# Patient Record
Sex: Female | Born: 1955
Health system: Southern US, Community
[De-identification: ages and names within clinical notes are randomized; demographics above are authoritative.]

## PROBLEM LIST (undated history)

## (undated) DIAGNOSIS — M8580 Other specified disorders of bone density and structure, unspecified site: Secondary | ICD-10-CM

## (undated) DIAGNOSIS — D072 Carcinoma in situ of vagina: Secondary | ICD-10-CM

## (undated) DIAGNOSIS — B1920 Unspecified viral hepatitis C without hepatic coma: Secondary | ICD-10-CM

## (undated) DIAGNOSIS — N870 Mild cervical dysplasia: Secondary | ICD-10-CM

## (undated) DIAGNOSIS — J40 Bronchitis, not specified as acute or chronic: Secondary | ICD-10-CM

## (undated) DIAGNOSIS — D219 Benign neoplasm of connective and other soft tissue, unspecified: Secondary | ICD-10-CM

## (undated) DIAGNOSIS — I10 Essential (primary) hypertension: Secondary | ICD-10-CM

## (undated) DIAGNOSIS — I219 Acute myocardial infarction, unspecified: Secondary | ICD-10-CM

## (undated) HISTORY — DX: Other specified disorders of bone density and structure, unspecified site: M85.80

## (undated) HISTORY — DX: Acute myocardial infarction, unspecified: I21.9

## (undated) HISTORY — PX: OOPHORECTOMY: SHX86

## (undated) HISTORY — PX: OTHER SURGICAL HISTORY: SHX169

## (undated) HISTORY — DX: Mild cervical dysplasia: N87.0

## (undated) HISTORY — DX: Carcinoma in situ of vagina: D07.2

## (undated) HISTORY — PX: CARPAL TUNNEL RELEASE: SHX101

## (undated) HISTORY — DX: Benign neoplasm of connective and other soft tissue, unspecified: D21.9

---

## 2000-04-01 ENCOUNTER — Encounter: Payer: Self-pay | Admitting: Family Medicine

## 2000-04-01 ENCOUNTER — Encounter: Admission: RE | Admit: 2000-04-01 | Discharge: 2000-04-01 | Payer: Self-pay | Admitting: Family Medicine

## 2000-07-22 ENCOUNTER — Encounter: Admission: RE | Admit: 2000-07-22 | Discharge: 2000-07-22 | Payer: Self-pay | Admitting: Family Medicine

## 2000-07-22 ENCOUNTER — Encounter: Payer: Self-pay | Admitting: Family Medicine

## 2000-09-07 ENCOUNTER — Encounter: Payer: Self-pay | Admitting: Family Medicine

## 2000-09-07 ENCOUNTER — Encounter: Admission: RE | Admit: 2000-09-07 | Discharge: 2000-09-07 | Payer: Self-pay | Admitting: Family Medicine

## 2000-10-08 ENCOUNTER — Ambulatory Visit (HOSPITAL_COMMUNITY): Admission: RE | Admit: 2000-10-08 | Discharge: 2000-10-08 | Payer: Self-pay | Admitting: Cardiology

## 2000-10-08 ENCOUNTER — Encounter: Payer: Self-pay | Admitting: Cardiology

## 2001-07-29 ENCOUNTER — Encounter: Admission: RE | Admit: 2001-07-29 | Discharge: 2001-07-29 | Payer: Self-pay | Admitting: Family Medicine

## 2001-07-29 ENCOUNTER — Encounter: Payer: Self-pay | Admitting: Family Medicine

## 2002-02-17 ENCOUNTER — Other Ambulatory Visit: Admission: RE | Admit: 2002-02-17 | Discharge: 2002-02-17 | Payer: Self-pay | Admitting: Obstetrics and Gynecology

## 2002-11-09 HISTORY — PX: TOTAL ABDOMINAL HYSTERECTOMY: SHX209

## 2002-11-11 ENCOUNTER — Emergency Department (HOSPITAL_COMMUNITY): Admission: EM | Admit: 2002-11-11 | Discharge: 2002-11-11 | Payer: Self-pay | Admitting: Emergency Medicine

## 2002-11-16 ENCOUNTER — Encounter: Admission: RE | Admit: 2002-11-16 | Discharge: 2002-11-16 | Payer: Self-pay | Admitting: Family Medicine

## 2002-11-16 ENCOUNTER — Encounter: Payer: Self-pay | Admitting: Family Medicine

## 2002-11-24 ENCOUNTER — Encounter: Admission: RE | Admit: 2002-11-24 | Discharge: 2002-11-24 | Payer: Self-pay | Admitting: Family Medicine

## 2002-11-24 ENCOUNTER — Encounter: Payer: Self-pay | Admitting: Family Medicine

## 2002-11-30 ENCOUNTER — Inpatient Hospital Stay (HOSPITAL_COMMUNITY): Admission: RE | Admit: 2002-11-30 | Discharge: 2002-12-04 | Payer: Self-pay | Admitting: Obstetrics and Gynecology

## 2002-11-30 ENCOUNTER — Encounter (INDEPENDENT_AMBULATORY_CARE_PROVIDER_SITE_OTHER): Payer: Self-pay | Admitting: Specialist

## 2003-01-15 ENCOUNTER — Encounter: Payer: Self-pay | Admitting: Family Medicine

## 2003-01-15 ENCOUNTER — Encounter: Admission: RE | Admit: 2003-01-15 | Discharge: 2003-01-15 | Payer: Self-pay | Admitting: Family Medicine

## 2003-12-03 ENCOUNTER — Other Ambulatory Visit: Admission: RE | Admit: 2003-12-03 | Discharge: 2003-12-03 | Payer: Self-pay | Admitting: Obstetrics and Gynecology

## 2004-01-21 ENCOUNTER — Encounter: Admission: RE | Admit: 2004-01-21 | Discharge: 2004-01-21 | Payer: Self-pay | Admitting: Obstetrics and Gynecology

## 2004-12-03 ENCOUNTER — Other Ambulatory Visit: Admission: RE | Admit: 2004-12-03 | Discharge: 2004-12-03 | Payer: Self-pay | Admitting: Obstetrics and Gynecology

## 2005-02-02 ENCOUNTER — Encounter: Admission: RE | Admit: 2005-02-02 | Discharge: 2005-02-02 | Payer: Self-pay | Admitting: Family Medicine

## 2005-08-03 ENCOUNTER — Encounter: Admission: RE | Admit: 2005-08-03 | Discharge: 2005-08-03 | Payer: Self-pay | Admitting: Family Medicine

## 2005-12-08 ENCOUNTER — Other Ambulatory Visit: Admission: RE | Admit: 2005-12-08 | Discharge: 2005-12-08 | Payer: Self-pay | Admitting: Obstetrics and Gynecology

## 2006-01-11 ENCOUNTER — Encounter: Admission: RE | Admit: 2006-01-11 | Discharge: 2006-01-11 | Payer: Self-pay | Admitting: Gastroenterology

## 2006-01-15 ENCOUNTER — Ambulatory Visit (HOSPITAL_BASED_OUTPATIENT_CLINIC_OR_DEPARTMENT_OTHER): Admission: RE | Admit: 2006-01-15 | Discharge: 2006-01-15 | Payer: Self-pay | Admitting: Obstetrics and Gynecology

## 2006-02-05 ENCOUNTER — Encounter: Admission: RE | Admit: 2006-02-05 | Discharge: 2006-02-05 | Payer: Self-pay | Admitting: Family Medicine

## 2006-03-22 ENCOUNTER — Other Ambulatory Visit: Admission: RE | Admit: 2006-03-22 | Discharge: 2006-03-22 | Payer: Self-pay | Admitting: Obstetrics and Gynecology

## 2006-07-14 ENCOUNTER — Emergency Department (HOSPITAL_COMMUNITY): Admission: EM | Admit: 2006-07-14 | Discharge: 2006-07-14 | Payer: Self-pay | Admitting: Family Medicine

## 2006-09-24 ENCOUNTER — Other Ambulatory Visit: Admission: RE | Admit: 2006-09-24 | Discharge: 2006-09-24 | Payer: Self-pay | Admitting: Obstetrics and Gynecology

## 2006-10-05 ENCOUNTER — Encounter: Admission: RE | Admit: 2006-10-05 | Discharge: 2006-10-05 | Payer: Self-pay | Admitting: Family Medicine

## 2006-12-13 ENCOUNTER — Other Ambulatory Visit: Admission: RE | Admit: 2006-12-13 | Discharge: 2006-12-13 | Payer: Self-pay | Admitting: Obstetrics and Gynecology

## 2007-02-09 ENCOUNTER — Encounter: Admission: RE | Admit: 2007-02-09 | Discharge: 2007-02-09 | Payer: Self-pay | Admitting: Obstetrics and Gynecology

## 2007-05-26 ENCOUNTER — Other Ambulatory Visit: Admission: RE | Admit: 2007-05-26 | Discharge: 2007-05-26 | Payer: Self-pay | Admitting: Obstetrics and Gynecology

## 2007-09-30 ENCOUNTER — Encounter: Admission: RE | Admit: 2007-09-30 | Discharge: 2007-09-30 | Payer: Self-pay | Admitting: Family Medicine

## 2007-11-17 ENCOUNTER — Emergency Department (HOSPITAL_COMMUNITY): Admission: EM | Admit: 2007-11-17 | Discharge: 2007-11-17 | Payer: Self-pay | Admitting: Emergency Medicine

## 2007-12-15 ENCOUNTER — Other Ambulatory Visit: Admission: RE | Admit: 2007-12-15 | Discharge: 2007-12-15 | Payer: Self-pay | Admitting: Obstetrics and Gynecology

## 2008-03-07 ENCOUNTER — Encounter: Admission: RE | Admit: 2008-03-07 | Discharge: 2008-03-07 | Payer: Self-pay | Admitting: Family Medicine

## 2008-06-19 ENCOUNTER — Other Ambulatory Visit: Admission: RE | Admit: 2008-06-19 | Discharge: 2008-06-19 | Payer: Self-pay | Admitting: Obstetrics and Gynecology

## 2008-12-17 ENCOUNTER — Ambulatory Visit: Payer: Self-pay | Admitting: Obstetrics and Gynecology

## 2008-12-17 ENCOUNTER — Other Ambulatory Visit: Admission: RE | Admit: 2008-12-17 | Discharge: 2008-12-17 | Payer: Self-pay | Admitting: Obstetrics and Gynecology

## 2008-12-17 ENCOUNTER — Encounter: Payer: Self-pay | Admitting: Obstetrics and Gynecology

## 2009-03-08 ENCOUNTER — Encounter: Admission: RE | Admit: 2009-03-08 | Discharge: 2009-03-08 | Payer: Self-pay | Admitting: Obstetrics and Gynecology

## 2009-03-12 ENCOUNTER — Ambulatory Visit: Payer: Self-pay | Admitting: Obstetrics and Gynecology

## 2009-12-27 ENCOUNTER — Other Ambulatory Visit: Admission: RE | Admit: 2009-12-27 | Discharge: 2009-12-27 | Payer: Self-pay | Admitting: Gynecology

## 2009-12-27 ENCOUNTER — Ambulatory Visit: Payer: Self-pay | Admitting: Gynecology

## 2010-01-16 ENCOUNTER — Ambulatory Visit: Payer: Self-pay | Admitting: Obstetrics and Gynecology

## 2010-03-13 ENCOUNTER — Ambulatory Visit: Payer: Self-pay | Admitting: Obstetrics and Gynecology

## 2010-03-14 ENCOUNTER — Ambulatory Visit: Payer: Self-pay | Admitting: Obstetrics and Gynecology

## 2010-03-14 ENCOUNTER — Ambulatory Visit (HOSPITAL_BASED_OUTPATIENT_CLINIC_OR_DEPARTMENT_OTHER): Admission: RE | Admit: 2010-03-14 | Discharge: 2010-03-14 | Payer: Self-pay | Admitting: Obstetrics and Gynecology

## 2010-03-19 ENCOUNTER — Encounter: Admission: RE | Admit: 2010-03-19 | Discharge: 2010-03-19 | Payer: Self-pay | Admitting: Family Medicine

## 2010-04-25 ENCOUNTER — Ambulatory Visit: Payer: Self-pay | Admitting: Obstetrics and Gynecology

## 2010-05-09 HISTORY — PX: OTHER SURGICAL HISTORY: SHX169

## 2010-11-10 ENCOUNTER — Ambulatory Visit: Payer: Self-pay | Admitting: Obstetrics and Gynecology

## 2010-11-10 ENCOUNTER — Other Ambulatory Visit
Admission: RE | Admit: 2010-11-10 | Discharge: 2010-11-10 | Payer: Self-pay | Source: Home / Self Care | Admitting: Obstetrics and Gynecology

## 2010-11-13 ENCOUNTER — Ambulatory Visit: Payer: Self-pay | Admitting: Oncology

## 2010-12-08 LAB — CBC WITH DIFFERENTIAL/PLATELET
BASO%: 0.5 % (ref 0.0–2.0)
EOS%: 1.3 % (ref 0.0–7.0)
HCT: 43.2 % (ref 34.8–46.6)
MCHC: 34.1 g/dL (ref 31.5–36.0)
MCV: 95.5 fL (ref 79.5–101.0)
NEUT#: 1.8 10*3/uL (ref 1.5–6.5)
Platelets: 167 10*3/uL (ref 145–400)

## 2010-12-08 LAB — MORPHOLOGY

## 2010-12-08 LAB — CHCC SMEAR

## 2010-12-09 LAB — HEPATITIS B CORE ANTIBODY, TOTAL: Hep B Core Total Ab: NEGATIVE

## 2010-12-09 LAB — HEPATITIS B SURFACE ANTIGEN: Hepatitis B Surface Ag: NEGATIVE

## 2010-12-09 LAB — COMPREHENSIVE METABOLIC PANEL
ALT: 31 U/L (ref 0–35)
AST: 31 U/L (ref 0–37)
Albumin: 4.5 g/dL (ref 3.5–5.2)
Alkaline Phosphatase: 61 U/L (ref 39–117)
BUN: 7 mg/dL (ref 6–23)
CO2: 28 mEq/L (ref 19–32)
Sodium: 139 mEq/L (ref 135–145)
Total Protein: 7.2 g/dL (ref 6.0–8.3)

## 2010-12-09 LAB — HEPATITIS B SURFACE ANTIBODY,QUALITATIVE: Hep B S Ab: NEGATIVE

## 2010-12-09 LAB — HEPATITIS C ANTIBODY: HCV Ab: REACTIVE — AB

## 2010-12-09 LAB — HEPATITIS B CORE ANTIBODY, IGM: Hep B C IgM: NEGATIVE

## 2010-12-09 LAB — ANA: Anti Nuclear Antibody(ANA): NEGATIVE

## 2011-01-02 ENCOUNTER — Encounter: Payer: Self-pay | Admitting: Obstetrics and Gynecology

## 2011-01-06 ENCOUNTER — Other Ambulatory Visit: Payer: Self-pay | Admitting: Obstetrics and Gynecology

## 2011-01-06 ENCOUNTER — Other Ambulatory Visit (HOSPITAL_COMMUNITY)
Admission: RE | Admit: 2011-01-06 | Discharge: 2011-01-06 | Disposition: A | Discharge status: Home / Self Care | Payer: 59 | Source: Ambulatory Visit | Attending: Obstetrics and Gynecology | Admitting: Obstetrics and Gynecology

## 2011-01-06 ENCOUNTER — Encounter (INDEPENDENT_AMBULATORY_CARE_PROVIDER_SITE_OTHER): Payer: 59 | Admitting: Obstetrics and Gynecology

## 2011-01-06 DIAGNOSIS — Z01419 Encounter for gynecological examination (general) (routine) without abnormal findings: Secondary | ICD-10-CM

## 2011-01-06 DIAGNOSIS — R87619 Unspecified abnormal cytological findings in specimens from cervix uteri: Secondary | ICD-10-CM | POA: Insufficient documentation

## 2011-03-18 ENCOUNTER — Encounter (INDEPENDENT_AMBULATORY_CARE_PROVIDER_SITE_OTHER): Payer: 59

## 2011-03-18 DIAGNOSIS — M899 Disorder of bone, unspecified: Secondary | ICD-10-CM

## 2011-03-27 NOTE — Op Note (Signed)
NAME:  Brittany Duffy, AGENT NO.:  [id number]   MEDICAL RECORD NO.:  [id number]                   PATIENT TYPE:  INP   LOCATION:  0450                                 FACILITY:  [facility]   PHYSICIAN:  Rande Brunt. Eda Paschal, M.D.           DATE OF BIRTH:  Aug 15, 1956   DATE OF PROCEDURE:  DATE OF DISCHARGE:                                 OPERATIVE REPORT   PREOPERATIVE DIAGNOSES:  1. Pelvic pain.  2. Leiomyomata uteri.  3. Left adnexal mass suspicious for either endometrioma or possible tubo-     ovarian abscess.   POSTOPERATIVE DIAGNOSES:  1. Pelvic pain.  2. Leiomyomata uteri.  3. Acute pelvic inflammatory disease with tubo-ovarian abscess on the left     ovary.   PROCEDURE:  Exploratory laparotomy with total abdominal hysterectomy and  bilateral salpingo-oophorectomy.   SURGEON:  Daniel L. Eda Paschal, M.D.   FIRST ASSISTANT:  Ivor Costa. Farrel Gobble, M.D.   ANESTHESIA:  General endotracheal.   FINDINGS:  At the time of surgery, the patient had multiple myomas enlarging  her uterus to approximately 12-14 weeks' size.  The patient's left tube was  grossly dilated, and there was a complex mass involving the left tube and  ovary which drained purulent material.  In addition, when the peritoneal  cavity was first opened there was purulent material seen although it was  more seropurulent than it was frank pus.  It was not true frank pus.  The  right ovary and tube showed chronic inflammatory processes without any  obvious abscess.  Sigmoid colon was densely adherent to the uterus.  The  left adnexa was behind the uterus.  There was some induration on the serosa  of the sigmoid and at the surface of the cecum from this mass but it was  clearly extracolonic and not related to the colon.  Ileocecal junction could  be identified, and the appendix was normal.  The upper abdomen was not  examined because of concern of spreading purulent material.   DESCRIPTION OF  PROCEDURE:  After adequate general endotracheal anesthesia,  the patient was placed in the supine position and prepped and draped in the  usual sterile manner.  A Foley catheter was inserted in her bladder.  It  should be noted that the patient had been running a low grade fever at home  but when she came to the hospital today, her temperature was almost 102.  A  midline vertical incision was made.  The fascia and the peritoneum were  entered vertically without difficulty and when the peritoneal cavity was  opened, some serous fluid was obtained which clearly looked infected.  Some  of it was collected for cytology, and aerobic and anaerobic cultures were  also obtained.  It was carefully done in such a fashion so as to prevent  purulent material running up the gutters and under the diaphragms.  She was  never placed in Trendelenburg but was kept level the  entire procedure and  the upper abdomen was packed away as best as possible with sponges.  When  the procedure was started, the first anatomic structure that could be  identified with ease was the left round ligament.  It was sutured and cut.  The retroperitoneal area was opened.  Initially, a ureter could not be  identified so the surgeon put his hand behind the uterus to try to free up  the area and in freeing up the adnexa the tubo-ovarian abscess ruptured and  there was some purulent material in the lower pelvis which was immediately  copiously irrigated and removed.  At this point, after the cultures were  obtained, and the problem had been identified as acute pelvic inflammatory  disease, the patient was given a dose of Unasyn and Garamycin, 3 g of Unasyn  and 80 mg of Garamycin.  She had gotten 1 g of Mefoxin before the procedure.  The surgery then continued once the tubo-ovarian abscess had been evacuated.  It was much easier to do the surgery.  The right round ligament was clamped,  cut and suture ligated with #1 chromic catgut.   The vesicouterine fold in  the peritoneum was sharply incised.  The right adnexa was elevated.  Although there was no tubo-ovarian abscess it was chronically infected.  The  ureter was identified and the right infundibulopelvic ligament was clamped,  cut and doubly suture ligated with #1 chromic catgut.  The entire structures  could then be mobilized but you could not remove the tubo-ovarian abscess at  this point for fear of injuring the ureter, so the uterus was removed first  prior to going back to get the left adnexa out.  A bladder flap could easily  be dissected.  The uterine arteries were clamped, cut and doubly suture  ligated with #1 chromic catgut.  The parametrium was taken in successive  bites by clamping, cutting and suture ligating with #1 chromic catgut.  The  cervicovaginal junction was identified and with sharp dissection the uterus,  right ovary and tube were removed.  The vagina looked healthy although the  serosa of the bowel had some inflammatory changes.  They appeared to be  superficial.  The vaginal angle sutures were replaced with #1 chromic catgut  incorporating the parametrium for good vault support.  The cuff was whipped  stitched with a running locking 0 Vicryl.  We then went back and removed the  left adnexa.  It was elevated now that the uterus was gone.  The ureter was  identified.  The IP ligament was clamped, cut and doubly suture ligated with  #1 chromic catgut.  At this point, the pelvis was copiously irrigated with  antibiotics bug juice to remove all of the purulent material.  Once again we  were very careful not to put her in Trendelenburg so we would not grossly  infect the subdiaphragmatic areas.  Once this was done, she was then  copiously irrigated with Ringer's lactate.  Although everything looked dry,  Surgicel was left to prevent oozing.  A Blake drain was left in the peritoneal cavity for good drainage.  Two sponge, needle and instrument   counts were correct.  The peritoneum and fascia were closed in a single  layer with two running 0 PDS.  It was a double-armed suture.  The  suprafascial tissue was then copiously irrigated with Ringer's lactate.  The  skin was closed with staples.  Estimated blood loss at the time of  the  procedure was 300 mL with none replaced.  The patient tolerated the  procedure well and left the operating room in satisfactory condition  draining clear urine from her Foley catheter.                                               Daniel L. Eda Paschal, M.D.    [name]  D:  11/30/2002  T:  12/01/2002  Job:  [id number]

## 2011-03-27 NOTE — Op Note (Signed)
Brittany Duffy, Brittany Duffy NO.:  [id number]   MEDICAL RECORD NO.:  [id number]          PATIENT TYPE:  AMB   LOCATION:  [facility]                         FACILITY:  [facility]   PHYSICIAN:  Daniel L. Gottsegen, M.D.DATE OF BIRTH:  06-09-1956   DATE OF PROCEDURE:  01/15/2006  DATE OF DISCHARGE:                                 OPERATIVE REPORT   PREOPERATIVE DIAGNOSIS:  Vaginal intraepithelial neoplasia III.   POSTOPERATIVE DIAGNOSIS:  Vaginal intraepithelial neoplasia III.   OPERATION:  CO2 laser of the vagina.   SURGEON:  Dr. Eda Paschal.   ANESTHESIA:  General.   INDICATIONS:  The patient is a 55 year old female who had been followed in  the office and had a Pap smear showing VAIN. Colposcopy with biopsy was done  in the office revealing high-grade vaginal dysplasia and as a result of  that, she is now taken to the operating room for laser of the vagina. It  should be noted that the patient previously had cervical dysplasia and was  status post cryo and then hysterectomy done for other indications.   FINDINGS:  At the time of surgery, the top of the vagina showed obvious  white epithelium even without using the microscope. Acetic acid made it even  more noticeable. Total area involved was 2-3 cm.   PROCEDURE:  After adequate general anesthesia, the patient was placed in the  dorsal supine position, prepped and draped in a sterile manner with wet  towels because of the laser. The microscope was utilized, acetic acid was  placed over the area to outline the vaginal dysplasia easily. A  needle with  sterile saline in it was injected underneath the mucosa to give [place] a buffer  point to prevent injury to underlying tissue and then using a CO2 laser at 8  watts power the entire area to be lasered was outlined leaving a very wide  margin. The area was then removed with the CO2 laser going all the way  through the mucosa to a depth of probably 4-5 mm. At the termination of the  procedure, there was no bleeding noted. The patient tolerated the procedure  well and left the operating room in satisfactory condition.      Daniel L. Eda Paschal, M.D.  Electronically Signed     DLG/MEDQ  D:  01/15/2006  T:  01/15/2006  Job:  [id number]

## 2011-03-27 NOTE — H&P (Signed)
NAME:  Brittany Duffy, Brittany Duffy                  ACCOUNT NO.:  [id number]   MEDICAL RECORD NO.:  [id number]                   PATIENT TYPE:  INP   LOCATION:  NA                                   FACILITY[facility]  [facility]   PHYSICIAN:  Daniel L. Eda Paschal, M.D.           DATE OF BIRTH:  Aug 23, 1956   DATE OF ADMISSION:  DATE OF DISCHARGE:                                HISTORY & PHYSICAL   CHIEF COMPLAINT:  Pelvic pain and fever.   HISTORY OF PRESENT ILLNESS:  The patient is a 55 year old, gravida 2, para  1, abortus 1, who presented to my office in early January with left lower  quadrant pain and fevers.  On examination she had an enlarged uterus due to  fibroids and a painful left adnexa.  She underwent ultrasound which revealed  multiple fibroids and a large mass of her left ovary of 6.7 x 7.2 x 4.9 cm.  It had an echodense ground-glass appearance with free fluid surrounding it.  It seemed to be most consistent with an endometrioma.  She was having some  upper respiratory symptoms so she was referred to her internist with the  thought that maybe the fever was due to that, and then we were going to  observe the mass.  The patient has now been treated with Zithromax, Biaxin,  and Augmentin.  Her fevers have persisted.  She continues to have a  leukocytosis.  She underwent a CT of her abdomen which was normal.  CT of  the pelvis showed that the mass has now increased in size.  As a result of  all of the above, she now enters the hospital for definitive surgery because  of her worsening condition.  She will undergo a total abdominal  hysterectomy, left salpingo-oophorectomy, and possibly a right salpingo-  oophorectomy if she either has a widespread inflammatory process or has a  malignancy.  Assuming she has disease confined to the uterus and the ovary,  we will leave her right ovary in as per her desire.  She has undergone a  bowel prep.   PAST MEDICAL HISTORY:  1. History of hypertension.  2. Diverticulum of the bladder.   PRESENT MEDICATIONS:  Lotensin for her blood pressure.  She also takes  Xanax.   ALLERGIES:  None.   FAMILY HISTORY:  Completely noncontributory except her mother with diabetes  and hypertension.   SOCIAL HISTORY:  She occasionally uses alcohol, and she smokes one pack of  cigarettes per day.   REVIEW OF SYSTEMS:  HEENT:  Negative.  CARDIAC:  Hypertension.  GASTROINTESTINAL:  Negative.  GENITOURINARY:  Negative.  NEUROMUSCULAR:  Negative.  ENDOCRINE:  Negative.  PSYCHIATRIC:  Negative.  IMMUNOLOGICAL:  Negative.   PHYSICAL EXAMINATION:  GENERAL:  Well-developed, well-nourished female in  distress from lower abdominal discomfort.  VITAL SIGNS:  Blood pressure 138/86, pulse 80 and regular, respirations 16  and unlabored.  She is afebrile.  HEENT:  All within normal limits.  NECK:  Supple.  Trachea is midline.  Thyroid is not enlarged.  LUNGS:  Clear to P&A.  HEART:  No thrills, heaves, or murmurs.  BREASTS:  No masses.  ABDOMEN:  Soft, without rebound or masses.  She does have left lower  quadrant guarding.  PELVIC:  External is within normal limits.  BUS is within normal limits.  Vaginal is within normal limits.  Cervix is clean.  There is no purulent  discharge.  Uterus is tender and is enlarged by fibroids to what is felt to  be about 12 weeks' size, although because of the left adnexal mass this  could be an overestimate.  Right adnexa is normal.  Left adnexa is tender,  and it is hard to differentiate a mass in the left side from her uterus.  RECTOVAGINAL:  Confirmatory.  EXTREMITIES:  Within normal limits.   ADMISSION IMPRESSION:  1. Left ovarian mass.  2. Leiomyomata uteri.  3. Pain.  4. Fever.   PLAN:  Exploratory laparotomy with appropriate surgery as outlined above.                                               Daniel L. Eda Paschal, M.D.    [name]  D:  11/30/2002  T:  11/30/2002  Job:  [id number]

## 2011-03-27 NOTE — Discharge Summary (Signed)
   NAME:  Brittany Duffy, NASS NO.:  [id number]   MEDICAL RECORD NO.:  [id number]                   PATIENT TYPE:  INP   LOCATION:  0450                                 FACILITY:  [facility]   PHYSICIAN:  Rande Brunt. Eda Paschal, M.D.           DATE OF BIRTH:  [birth year]   DATE OF ADMISSION:  11/30/2002  DATE OF DISCHARGE:  12/04/2002                                 DISCHARGE SUMMARY   HISTORY OF PRESENT ILLNESS:  The patient is a 55 year old female who was  admitted to the hospital with chronic pelvic pain, persistent fever,  leukocytosis, and a left adnexal mass.   HOSPITAL COURSE:  On the day of admission she was taken to the operating  room.  A total abdominal hysterectomy, bilateral salpingo-oophorectomy was  performed for chronic pelvic inflammatory disease as well as an acute tubo-  ovarian abscess of the left ovary.  The patient also had multiple fibroids.  Postoperatively she was continued on Unasyn and gentamicin because of the  acute PID.  She continued to spike a slight temperature every night, between  100 and 101.4, but by the third postoperative day she started to become  afebrile and remained afebrile for 24 hours.  She also had a slight ileus  that responded to IVs, and by the fourth postoperative day she was passing  gas and was afebrile and was ready for discharge.   DISCHARGE MEDICATIONS:  1. Augmentin 875 mg 1 b.i.d. for four more days.  2. Tylox for pain relief.   DIET:  Regular.   ACTIVITY:  Ambulatory.   WOUND CARE:  Routine.   FOLLOW-UP:  She will return to the office on Friday for staple removal.   CONDITION ON DISCHARGE:  Improved.   PATHOLOGY:  Final pathology report not available at the time of dictation.   DISCHARGE DIAGNOSES:  1. Acute and chronic pelvic inflammatory disease with left tubo-ovarian     abscess.  2. Leiomyomata uteri.                                               Daniel L. Eda Paschal, M.D.    [name]   D:  12/04/2002  T:  12/04/2002  Job:  [id number]

## 2011-04-02 ENCOUNTER — Other Ambulatory Visit: Payer: Self-pay | Admitting: Oncology

## 2011-04-02 ENCOUNTER — Encounter (HOSPITAL_BASED_OUTPATIENT_CLINIC_OR_DEPARTMENT_OTHER): Payer: 59 | Admitting: Oncology

## 2011-04-02 DIAGNOSIS — D72819 Decreased white blood cell count, unspecified: Secondary | ICD-10-CM

## 2011-04-02 DIAGNOSIS — B1710 Acute hepatitis C without hepatic coma: Secondary | ICD-10-CM

## 2011-04-02 LAB — CBC WITH DIFFERENTIAL/PLATELET
BASO%: 0.7 % (ref 0.0–2.0)
EOS%: 4.1 % (ref 0.0–7.0)
Eosinophils Absolute: 0.2 10*3/uL (ref 0.0–0.5)
MCH: 33.2 pg (ref 25.1–34.0)
MONO#: 0.3 10*3/uL (ref 0.1–0.9)
MONO%: 7.4 % (ref 0.0–14.0)
NEUT%: 37.7 % — ABNORMAL LOW (ref 38.4–76.8)

## 2011-04-03 LAB — HEPATITIS C RNA QUANTITATIVE
HCV Quantitative Log: 5.18 {Log} — ABNORMAL HIGH (ref <1.63)
HCV Quantitative: 151000 IU/mL — ABNORMAL HIGH (ref <43)

## 2011-07-31 ENCOUNTER — Encounter (HOSPITAL_BASED_OUTPATIENT_CLINIC_OR_DEPARTMENT_OTHER): Payer: BC Managed Care – PPO | Admitting: Oncology

## 2011-07-31 ENCOUNTER — Other Ambulatory Visit: Payer: Self-pay | Admitting: Oncology

## 2011-07-31 DIAGNOSIS — D72819 Decreased white blood cell count, unspecified: Secondary | ICD-10-CM

## 2011-07-31 DIAGNOSIS — B1710 Acute hepatitis C without hepatic coma: Secondary | ICD-10-CM

## 2011-07-31 LAB — CBC WITH DIFFERENTIAL/PLATELET
BASO%: 0.5 % (ref 0.0–2.0)
Basophils Absolute: 0 10*3/uL (ref 0.0–0.1)
EOS%: 2.3 % (ref 0.0–7.0)
HCT: 39.7 % (ref 34.8–46.6)
MCHC: 34.9 g/dL (ref 31.5–36.0)
MCV: 95.7 fL (ref 79.5–101.0)
MONO#: 0.4 10*3/uL (ref 0.1–0.9)
Platelets: 148 10*3/uL (ref 145–400)

## 2011-07-31 LAB — COMPREHENSIVE METABOLIC PANEL
AST: 34 U/L (ref 0–37)
Alkaline Phosphatase: 66 U/L (ref 39–117)
BUN: 11 mg/dL (ref 6–23)
Calcium: 10.5 mg/dL (ref 8.4–10.5)
Chloride: 102 mEq/L (ref 96–112)
Creatinine, Ser: 0.61 mg/dL (ref 0.50–1.10)
Potassium: 3.8 mEq/L (ref 3.5–5.3)
Sodium: 140 mEq/L (ref 135–145)
Total Bilirubin: 0.3 mg/dL (ref 0.3–1.2)
Total Protein: 7.4 g/dL (ref 6.0–8.3)

## 2011-07-31 LAB — MORPHOLOGY
PLT EST: ADEQUATE
RBC Comments: NORMAL

## 2011-08-06 ENCOUNTER — Encounter: Payer: Self-pay | Admitting: *Deleted

## 2011-08-06 DIAGNOSIS — M8580 Other specified disorders of bone density and structure, unspecified site: Secondary | ICD-10-CM | POA: Insufficient documentation

## 2011-08-06 DIAGNOSIS — D072 Carcinoma in situ of vagina: Secondary | ICD-10-CM | POA: Insufficient documentation

## 2011-08-06 DIAGNOSIS — N7092 Oophoritis, unspecified: Secondary | ICD-10-CM | POA: Insufficient documentation

## 2011-08-07 ENCOUNTER — Ambulatory Visit: Payer: 59 | Admitting: Women's Health

## 2011-08-07 ENCOUNTER — Encounter: Payer: Self-pay | Admitting: Women's Health

## 2011-08-07 ENCOUNTER — Other Ambulatory Visit (HOSPITAL_COMMUNITY)
Admission: RE | Admit: 2011-08-07 | Discharge: 2011-08-07 | Disposition: A | Discharge status: Home / Self Care | Payer: BC Managed Care – PPO | Source: Ambulatory Visit | Attending: Women's Health | Admitting: Women's Health

## 2011-08-07 ENCOUNTER — Ambulatory Visit (INDEPENDENT_AMBULATORY_CARE_PROVIDER_SITE_OTHER): Payer: BC Managed Care – PPO | Admitting: Women's Health

## 2011-08-07 VITALS — BP 130/80

## 2011-08-07 DIAGNOSIS — N893 Dysplasia of vagina, unspecified: Secondary | ICD-10-CM

## 2011-08-07 DIAGNOSIS — Z889 Allergy status to unspecified drugs, medicaments and biological substances status: Secondary | ICD-10-CM

## 2011-08-07 DIAGNOSIS — Z9109 Other allergy status, other than to drugs and biological substances: Secondary | ICD-10-CM

## 2011-08-07 DIAGNOSIS — Z01419 Encounter for gynecological examination (general) (routine) without abnormal findings: Secondary | ICD-10-CM | POA: Insufficient documentation

## 2011-08-07 MED ORDER — FLUTICASONE PROPIONATE 50 MCG/ACT NA SUSP: 2.0000 | Freq: Every day | NASAL | Status: DC

## 2011-08-07 NOTE — Progress Notes (Signed)
  Presents for repeat Pap without complaint. History of VAIN 1 8/ 09, history of VAIN III 3/11, laser of vagina 3/11,normal pap 2/12.  External genitalia is within normal limits, speculum exam there discharge or erythema was noted, repeat Pap was taken and will triage based on results. Reviewed if normal, repeat Pap in February at annual exam.  Requested refill of Flonase prescription was given.

## 2011-08-12 ENCOUNTER — Other Ambulatory Visit: Payer: Self-pay | Admitting: Women's Health

## 2011-08-12 DIAGNOSIS — Z889 Allergy status to unspecified drugs, medicaments and biological substances status: Secondary | ICD-10-CM

## 2011-08-12 MED ORDER — FLUTICASONE PROPIONATE 50 MCG/ACT NA SUSP: 2.0000 | Freq: Every day | NASAL | Status: DC

## 2011-10-12 ENCOUNTER — Other Ambulatory Visit: Payer: Self-pay | Admitting: Obstetrics and Gynecology

## 2011-10-12 DIAGNOSIS — Z1231 Encounter for screening mammogram for malignant neoplasm of breast: Secondary | ICD-10-CM

## 2011-10-14 ENCOUNTER — Ambulatory Visit
Admission: RE | Admit: 2011-10-14 | Discharge: 2011-10-14 | Disposition: A | Discharge status: Home / Self Care | Payer: BC Managed Care – PPO | Source: Ambulatory Visit | Attending: Obstetrics and Gynecology | Admitting: Obstetrics and Gynecology

## 2011-10-14 DIAGNOSIS — Z1231 Encounter for screening mammogram for malignant neoplasm of breast: Secondary | ICD-10-CM

## 2011-11-13 ENCOUNTER — Other Ambulatory Visit: Payer: Self-pay | Admitting: Obstetrics and Gynecology

## 2011-12-17 ENCOUNTER — Other Ambulatory Visit: Payer: Self-pay | Admitting: Obstetrics and Gynecology

## 2012-01-23 ENCOUNTER — Encounter (HOSPITAL_COMMUNITY): Payer: Self-pay | Admitting: Emergency Medicine

## 2012-01-23 ENCOUNTER — Emergency Department (HOSPITAL_COMMUNITY)
Admission: EM | Admit: 2012-01-23 | Discharge: 2012-01-23 | Disposition: A | Discharge status: Home / Self Care | Payer: BC Managed Care – PPO | Source: Home / Self Care | Attending: Family Medicine | Admitting: Family Medicine

## 2012-01-23 DIAGNOSIS — J302 Other seasonal allergic rhinitis: Secondary | ICD-10-CM

## 2012-01-23 DIAGNOSIS — J309 Allergic rhinitis, unspecified: Secondary | ICD-10-CM

## 2012-01-23 HISTORY — DX: Essential (primary) hypertension: I10

## 2012-01-23 HISTORY — DX: Bronchitis, not specified as acute or chronic: J40

## 2012-01-23 MED ORDER — AZELASTINE HCL 0.1 % NA SOLN: 1.0000 | Freq: Two times a day (BID) | NASAL | Status: AC

## 2012-01-23 NOTE — Discharge Instructions (Signed)
You must stop smoking, use saline nose spray or Netti pot as discussed, drink lots of water, see your doctor as needed.

## 2012-01-23 NOTE — ED Notes (Signed)
Patient reports sinus complaints for more than a year.  , reports drainage down throat, stuffy nose intermittently, denies cough, denies fever.

## 2012-01-23 NOTE — ED Notes (Signed)
Patient not in treatment room 

## 2012-01-23 NOTE — ED Provider Notes (Signed)
History     CSN: [id number]  Arrival date & time 01/23/12  1443   First MD Initiated Contact with Patient 01/23/12 1505      Chief Complaint  Patient presents with  . URI    (Consider location/radiation/quality/duration/timing/severity/associated sxs/prior treatment) Patient is a 56 y.o. female presenting with URI. The history is provided by the patient.  URI Primary symptoms do not include fever or sore throat. The current episode started more than 1 week ago (sx for 1.5 yrs). This is a new problem. The problem has not changed since onset. Symptoms associated with the illness include congestion and rhinorrhea.    Past Medical History  Diagnosis Date  . Ovarian abscess     TUBAL OVARIAN ABSCESS  . Fibroid   . Dysplasia of cervix, low grade (CIN 1)     S/P CRYO  . Vaginal intraepithelial neoplasia III (VAIN III)   . Osteopenia   . Hypertension   . Bronchitis     Past Surgical History  Procedure Date  . Diverticulum of the bladder   . Total abdominal hysterectomy 2004    BSO  . Carpal tunnel release   . Co2 lsaer of vagina 07, 11    Family History  Problem Relation Age of Onset  . Diabetes Mother   . Hypertension Mother   . Breast cancer Mother     History  Substance Use Topics  . Smoking status: Current Everyday Smoker  . Smokeless tobacco: Never Used  . Alcohol Use: Yes     occ.    OB History    Grav Para Term Preterm Abortions TAB SAB Ect Mult Living   2 1   1 1           Review of Systems  Constitutional: Negative for fever.  HENT: Positive for congestion, rhinorrhea and postnasal drip. Negative for sore throat.   Eyes: Negative.   Respiratory: Negative.   Cardiovascular: Negative.     Allergies  Review of patient's allergies indicates no known allergies.  Home Medications   Current Outpatient Rx  Name Route Sig Dispense Refill  . XANAX PO Oral Take by mouth.      . NORVASC PO Oral Take by mouth.      Marland Kitchen VITAMIN C PO Oral Take by mouth.       . AZELASTINE HCL 137 MCG/SPRAY NA SOLN Nasal Place 1 spray into the nose 2 (two) times daily. Use in each nostril as directed 30 mL 12  . CALCIUM PO Oral Take by mouth.      . DOXYCYCLINE HYCLATE 100 MG PO CAPS Oral Take 100 mg by mouth 2 (two) times daily.      Marland Kitchen ESTRADIOL 0.0375 MG/24HR TD PTWK Transdermal Place 1 patch onto the skin once a week.      . OMEGA-3 FATTY ACIDS 1000 MG PO CAPS Oral Take 2 g by mouth daily.      Marland Kitchen FLUTICASONE PROPIONATE 50 MCG/ACT NA SUSP Nasal Place 2 sprays into the nose daily. 16 g 12  . MULTIVITAMIN PO Oral Take by mouth.      Marland Kitchen VITAMIN E PO Oral Take by mouth.        BP 164/94  Pulse 79  Temp(Src) 98.8 F (37.1 C) (Oral)  Resp 18  SpO2 99%  LMP 01/23/2012  Physical Exam  Nursing note and vitals reviewed. Constitutional: She is oriented to person, place, and time. She appears well-developed and well-nourished.  HENT:  Head: Normocephalic.  Right  Ear: External ear normal.  Left Ear: External ear normal.  Nose: Nose normal.  Mouth/Throat: Oropharynx is clear and moist.  Eyes: Conjunctivae are normal. Pupils are equal, round, and reactive to light.  Neck: Normal range of motion. Neck supple.  Pulmonary/Chest: Breath sounds normal.  Lymphadenopathy:    She has no cervical adenopathy.  Neurological: She is alert and oriented to person, place, and time.  Skin: Skin is warm and dry.    ED Course  Procedures (including critical care time)  Labs Reviewed - No data to display No results found.   1. Allergic rhinitis, seasonal       MDM          Linna Hoff, MD 01/23/12 [phone]

## 2012-02-12 ENCOUNTER — Ambulatory Visit (INDEPENDENT_AMBULATORY_CARE_PROVIDER_SITE_OTHER): Payer: BC Managed Care – PPO | Admitting: Obstetrics and Gynecology

## 2012-02-12 ENCOUNTER — Other Ambulatory Visit (HOSPITAL_COMMUNITY)
Admission: RE | Admit: 2012-02-12 | Discharge: 2012-02-12 | Disposition: A | Discharge status: Home / Self Care | Payer: BC Managed Care – PPO | Source: Ambulatory Visit | Attending: Obstetrics and Gynecology | Admitting: Obstetrics and Gynecology

## 2012-02-12 ENCOUNTER — Encounter: Payer: BC Managed Care – PPO | Admitting: Obstetrics and Gynecology

## 2012-02-12 ENCOUNTER — Encounter: Payer: Self-pay | Admitting: Obstetrics and Gynecology

## 2012-02-12 VITALS — BP 118/74 | Ht 61.0 in | Wt 136.0 lb

## 2012-02-12 DIAGNOSIS — Z01419 Encounter for gynecological examination (general) (routine) without abnormal findings: Secondary | ICD-10-CM

## 2012-02-12 DIAGNOSIS — N890 Mild vaginal dysplasia: Secondary | ICD-10-CM

## 2012-02-12 DIAGNOSIS — N893 Dysplasia of vagina, unspecified: Secondary | ICD-10-CM

## 2012-02-12 MED ORDER — ESTRADIOL 0.0375 MG/24HR TD PTWK: 1.0000 | MEDICATED_PATCH | TRANSDERMAL | Status: DC

## 2012-02-12 NOTE — Progress Notes (Signed)
Patient came to see me today for her annual GYN exam. She is now had 3 normal Pap smears since we treated her for high-grade vaginal dysplasia. She is up-to-date on mammograms. She does have osteopenia on bone density without an elevated fracture risk. She is due for followup bone density in May of 2014. She thinks she's had her lab work done elsewhere. She will check and let me know if she has not. She remains on the Climara patch with excellent results. She is having no vaginal bleeding. She is having no pelvic pain.  HEENT: Within normal limits. Kennon Portela present. Neck: No masses. Supraclavicular lymph nodes: Not enlarged. Breasts: Examined in both sitting and lying position. Symmetrical without skin changes or masses. Abdomen: Soft no masses guarding or rebound. No hernias. Pelvic: External within normal limits. BUS within normal limits. Vaginal examination shows good estrogen effect, no cystocele enterocele or rectocele. Cervix and uterus absent. Adnexa within normal limits. Rectovaginal confirmatory. Extremities within normal limits.  Assessment:VAIN 3 with reoccurrence status post 2 CO2 lasers of the vagina. 3 normal Pap smears now. Vasomotor symptoms. Osteopenia.  Plan: Continue yearly mammograms. Continue Climara patch. Followup bone density 2 years after her last one.

## 2012-02-13 LAB — URINALYSIS W MICROSCOPIC + REFLEX CULTURE
Bacteria, UA: NONE SEEN
Casts: NONE SEEN
Crystals: NONE SEEN
Ketones, ur: NEGATIVE mg/dL
Leukocytes, UA: NEGATIVE
Nitrite: NEGATIVE
Specific Gravity, Urine: 1.007 (ref 1.005–1.030)
pH: 7 (ref 5.0–8.0)

## 2012-11-04 ENCOUNTER — Other Ambulatory Visit: Payer: Self-pay | Admitting: Obstetrics and Gynecology

## 2012-11-04 DIAGNOSIS — Z1231 Encounter for screening mammogram for malignant neoplasm of breast: Secondary | ICD-10-CM

## 2012-12-08 ENCOUNTER — Ambulatory Visit
Admission: RE | Admit: 2012-12-08 | Discharge: 2012-12-08 | Disposition: A | Discharge status: Home / Self Care | Payer: BC Managed Care – PPO | Source: Ambulatory Visit | Attending: Obstetrics and Gynecology | Admitting: Obstetrics and Gynecology

## 2012-12-08 DIAGNOSIS — Z1231 Encounter for screening mammogram for malignant neoplasm of breast: Secondary | ICD-10-CM

## 2013-02-13 ENCOUNTER — Encounter: Payer: BC Managed Care – PPO | Admitting: Women's Health

## 2013-02-17 ENCOUNTER — Other Ambulatory Visit (HOSPITAL_COMMUNITY)
Admission: RE | Admit: 2013-02-17 | Discharge: 2013-02-17 | Disposition: A | Discharge status: Home / Self Care | Payer: BC Managed Care – PPO | Source: Ambulatory Visit | Attending: Obstetrics and Gynecology | Admitting: Obstetrics and Gynecology

## 2013-02-17 ENCOUNTER — Encounter: Payer: Self-pay | Admitting: Women's Health

## 2013-02-17 ENCOUNTER — Ambulatory Visit (INDEPENDENT_AMBULATORY_CARE_PROVIDER_SITE_OTHER): Payer: BC Managed Care – PPO | Admitting: Women's Health

## 2013-02-17 VITALS — BP 120/70 | Ht 61.0 in | Wt 137.0 lb

## 2013-02-17 DIAGNOSIS — M899 Disorder of bone, unspecified: Secondary | ICD-10-CM

## 2013-02-17 DIAGNOSIS — Z01419 Encounter for gynecological examination (general) (routine) without abnormal findings: Secondary | ICD-10-CM

## 2013-02-17 DIAGNOSIS — F17210 Nicotine dependence, cigarettes, uncomplicated: Secondary | ICD-10-CM | POA: Insufficient documentation

## 2013-02-17 DIAGNOSIS — M8580 Other specified disorders of bone density and structure, unspecified site: Secondary | ICD-10-CM

## 2013-02-17 DIAGNOSIS — Z79890 Hormone replacement therapy (postmenopausal): Secondary | ICD-10-CM

## 2013-02-17 DIAGNOSIS — F17200 Nicotine dependence, unspecified, uncomplicated: Secondary | ICD-10-CM

## 2013-02-17 MED ORDER — ESTRADIOL 0.0375 MG/24HR TD PTWK: 1.0000 | MEDICATED_PATCH | TRANSDERMAL | Status: DC

## 2013-02-17 NOTE — Addendum Note (Signed)
Addended by: Aura Camps on: 02/17/2013 02:46 PM   Modules accepted: Orders

## 2013-02-17 NOTE — Patient Instructions (Addendum)

## 2013-02-17 NOTE — Progress Notes (Signed)
Brittany Duffy 02/11/1956 [id number]    History:    The patient presents for annual exam.  TAH with BSO 2004 on Climara 0.0375 patch weekly. History of CO2 laser the vagina for VAIN 111, normal Paps 2012,2013. Smokes half pack cigarettes per day. Negative colonoscopy 2007. Osteopenia, DEXA 03/2011, T score AP spine -1.2, bilateral hip average -1, FRAX 3.1%/0.3%. Same partner. Hypertension-primary care labs and meds. Normal mammogram history.   Past medical history, past surgical history, family history and social history were all reviewed and documented in the EPIC chart. 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 Mother with diabetes/hypertension/breast cancer in her 49s, died from diabetes complications.   ROS:  A  ROS was performed and pertinent positives and negatives are included in the history.  Exam:  Filed Vitals:   02/17/13 1207  BP: 120/70    General appearance:  Normal Head/Neck:  Normal, without cervical or supraclavicular adenopathy. Thyroid:  Symmetrical, normal in size, without palpable masses or nodularity. Respiratory  Effort:  Normal  Auscultation:  Clear without wheezing or rhonchi Cardiovascular  Auscultation:  Regular rate, without rubs, murmurs or gallops  Edema/varicosities:  Not grossly evident Abdominal  Soft,nontender, without masses, guarding or rebound.  Liver/spleen:  No organomegaly noted  Hernia:  None appreciated  Skin  Inspection:  Grossly normal  Palpation:  Grossly normal Neurologic/psychiatric  Orientation:  Normal with appropriate conversation.  Mood/affect:  Normal  Genitourinary    Breasts: Examined lying and sitting.     Right: Without masses, retractions, discharge or axillary adenopathy.     Left: Without masses, retractions, discharge or axillary adenopathy.   Inguinal/mons:  Normal without inguinal adenopathy  External genitalia:  Normal  BUS/Urethra/Skene's glands:  Normal  Bladder:  Normal  Vagina:  Normal  Cervix:   absent  Uterus:  absent  Adnexa/parametria:     Rt: Without masses or tenderness.   Lt: Without masses or tenderness.  Anus and perineum: Normal  Digital rectal exam: Normal sphincter tone without palpated masses or tenderness  Assessment/Plan:  57 y.o.SBF G2 P1  for annual exam with no complaints.  TAH/BSO on Climara 0.0375 patch weekly CO2 laser 2007 VAIN 111 Hypertension-primary care labs and meds Osteopenia Smoker half pack per day  Plan: Aware of hazards of smoking is trying to decrease and quit. Repeat DEXA, will schedule, SBE's, continue annual mammogram, calcium rich diet, vitamin D 2000 daily encouraged. Home safety, fall prevention and importance of regular exercise reviewed. Climara 0.0375 patch weekly, reviewed risk for blood clots, strokes, breast cancer. Home Hemoccult card given with instructions. Repeat colonoscopy 2017.    Harrington Challenger WHNP, 1:12 PM 02/17/2013

## 2013-02-23 ENCOUNTER — Encounter: Payer: Self-pay | Admitting: Obstetrics and Gynecology

## 2013-03-10 ENCOUNTER — Other Ambulatory Visit: Payer: Self-pay | Admitting: Anesthesiology

## 2013-03-10 DIAGNOSIS — Z1211 Encounter for screening for malignant neoplasm of colon: Secondary | ICD-10-CM

## 2013-06-21 ENCOUNTER — Other Ambulatory Visit: Payer: Self-pay | Admitting: Gynecology

## 2013-06-21 DIAGNOSIS — M8580 Other specified disorders of bone density and structure, unspecified site: Secondary | ICD-10-CM

## 2013-06-29 ENCOUNTER — Ambulatory Visit (INDEPENDENT_AMBULATORY_CARE_PROVIDER_SITE_OTHER): Payer: BC Managed Care – PPO

## 2013-06-29 DIAGNOSIS — M899 Disorder of bone, unspecified: Secondary | ICD-10-CM

## 2013-06-29 DIAGNOSIS — M8580 Other specified disorders of bone density and structure, unspecified site: Secondary | ICD-10-CM

## 2013-06-30 ENCOUNTER — Encounter: Payer: Self-pay | Admitting: Gynecology

## 2013-09-25 ENCOUNTER — Other Ambulatory Visit: Payer: Self-pay | Admitting: Obstetrics and Gynecology

## 2013-10-04 ENCOUNTER — Telehealth: Payer: Self-pay

## 2013-10-04 MED ORDER — ESTRADIOL 0.0375 MG/24HR TD PTWK: MEDICATED_PATCH | TRANSDERMAL | Status: DC

## 2013-10-04 NOTE — Telephone Encounter (Signed)
Patient called in voice mail stating she needed her estradiol called in to pharmacy.  Maryelizabeth Rowan NP had given her a year's Rx back in 02/2013.  Refills sent to pharmacy.

## 2013-12-08 ENCOUNTER — Other Ambulatory Visit: Payer: Self-pay

## 2013-12-08 DIAGNOSIS — Z1231 Encounter for screening mammogram for malignant neoplasm of breast: Secondary | ICD-10-CM

## 2013-12-21 ENCOUNTER — Ambulatory Visit
Admission: RE | Admit: 2013-12-21 | Discharge: 2013-12-21 | Disposition: A | Discharge status: Home / Self Care | Payer: BC Managed Care – PPO | Source: Ambulatory Visit

## 2013-12-21 DIAGNOSIS — Z1231 Encounter for screening mammogram for malignant neoplasm of breast: Secondary | ICD-10-CM

## 2014-02-23 ENCOUNTER — Encounter: Payer: Self-pay | Admitting: Women's Health

## 2014-02-23 ENCOUNTER — Ambulatory Visit (INDEPENDENT_AMBULATORY_CARE_PROVIDER_SITE_OTHER): Payer: BC Managed Care – PPO | Admitting: Women's Health

## 2014-02-23 ENCOUNTER — Other Ambulatory Visit (HOSPITAL_COMMUNITY)
Admission: RE | Admit: 2014-02-23 | Discharge: 2014-02-23 | Disposition: A | Discharge status: Home / Self Care | Payer: BC Managed Care – PPO | Source: Ambulatory Visit | Attending: Gynecology | Admitting: Gynecology

## 2014-02-23 VITALS — BP 122/80 | Ht 61.0 in | Wt 140.6 lb

## 2014-02-23 DIAGNOSIS — Z79890 Hormone replacement therapy (postmenopausal): Secondary | ICD-10-CM

## 2014-02-23 DIAGNOSIS — Z01419 Encounter for gynecological examination (general) (routine) without abnormal findings: Secondary | ICD-10-CM | POA: Insufficient documentation

## 2014-02-23 MED ORDER — ESTRADIOL 0.0375 MG/24HR TD PTWK: MEDICATED_PATCH | TRANSDERMAL | Status: DC

## 2014-02-23 NOTE — Progress Notes (Signed)
Brittany Duffy 1956-09-30 [id number]    History:    Presents for annual exam. TAH with BSO 2004 on 1/2 Climara 0.0375 patch weekly with relief of menopausal symptoms. History of CO2 laser of vagina for VAIN 111, normal Paps 2012, 2013, 2014. Smokes half pack cigarettes per day. Negative colonoscopy 2007. Osteopenia, DEXA 06/2013, T score AP spine -1.5, bilateral hip average -1.1 FRAX 3.1%/0.3%.  Same partner. Hypertension-primary care labs and meds. Normal mammogram history.   Past medical history, past surgical history, family history and social history were all reviewed and documented in the EPIC chart. 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 Mother with diabetes/hypertension/breast cancer in her 63s, died from diabetes complications. Smoker.   ROS:  A  ROS was performed and pertinent positives and negatives are included.  Exam:  Filed Vitals:   02/23/14 1146  BP: 122/80    General appearance:  Normal Thyroid:  Symmetrical, normal in size, without palpable masses or nodularity. Respiratory  Auscultation:  Clear without wheezing or rhonchi Cardiovascular  Auscultation:  Regular rate, without rubs, murmurs or gallops  Edema/varicosities:  Not grossly evident Abdominal  Soft,nontender, without masses, guarding or rebound.  Liver/spleen:  No organomegaly noted  Hernia:  None appreciated  Skin  Inspection:  Grossly normal   Breasts: Examined lying and sitting.     Right: Without masses, retractions, discharge or axillary adenopathy.     Left: Without masses, retractions, discharge or axillary adenopathy. Gentitourinary   Inguinal/mons:  Normal without inguinal adenopathy  External genitalia:  Normal  BUS/Urethra/Skene's glands:  Normal  Vagina:  Normal  Anus and perineum: Normal  Digital rectal exam: Normal sphincter tone without palpated masses or tenderness  Assessment/Plan:  58 y.o.  SBF G2 P1 for annual exam with no complaints.  TAH/BSO on ERT CO2 laser 2007  VAIN 111  Hypertension-primary care labs and meds  Osteopenia  Smoker half pack per day  Plan: Aware of hazards of smoking is trying to decrease and quit. SBE's, continue annual mammogram, calcium rich diet, vitamin D 2000 daily encouraged. Home safety, fall prevention and importance of regular exercise reviewed.  Climara 0.0375 patch weekly, reviewed risk for blood clots, strokes, breast cancer, reports line using at patch has numerous hot flashes and doesn't feel well, states uses half patch with good relief of symptoms.. Repeat colonoscopy 2017, repeat DEXA 2016. Zostavac and pnuemovac encouraged    Huel Cote [facility], 12:30 PM 02/23/2014

## 2014-02-23 NOTE — Patient Instructions (Signed)
Health Recommendations for Postmenopausal Women Respected and ongoing research has looked at the most common causes of death, disability, and poor quality of life in postmenopausal women. The causes include heart disease, diseases of blood vessels, diabetes, depression, cancer, and bone loss (osteoporosis). Many things can be done to help lower the chances of developing these and other common problems: CARDIOVASCULAR DISEASE Heart Disease: A heart attack is a medical emergency. Know the signs and symptoms of a heart attack. Below are things women can do to reduce their risk for heart disease.   Do not smoke. If you smoke, quit.  Aim for a healthy weight. Being overweight causes many preventable deaths. Eat a healthy and balanced diet and drink an adequate amount of liquids.  Get moving. Make a commitment to be more physically active. Aim for 30 minutes of activity on most, if not all days of the week.  Eat for heart health. Choose a diet that is low in saturated fat and cholesterol and eliminate trans fat. Include whole grains, vegetables, and fruits. Read and understand the labels on food containers before buying.  Know your numbers. Ask your caregiver to check your blood pressure, cholesterol (total, HDL, LDL, triglycerides) and blood glucose. Work with your caregiver on improving your entire clinical picture.  High blood pressure. Limit or stop your table salt intake (try salt substitute and food seasonings). Avoid salty foods and drinks. Read labels on food containers before buying. Eating well and exercising can help control high blood pressure. STROKE  Stroke is a medical emergency. Stroke may be the result of a blood clot in a blood vessel in the brain or by a brain hemorrhage (bleeding). Know the signs and symptoms of a stroke. To lower the risk of developing a stroke:  Avoid fatty foods.  Quit smoking.  Control your diabetes, blood pressure, and irregular heart rate. THROMBOPHLEBITIS  (BLOOD CLOT) OF THE LEG  Becoming overweight and leading a stationary lifestyle may also contribute to developing blood clots. Controlling your diet and exercising will help lower the risk of developing blood clots. CANCER SCREENING  Breast Cancer: Take steps to reduce your risk of breast cancer.  You should practice "breast self-awareness." This means understanding the normal appearance and feel of your breasts and should include breast self-examination. Any changes detected, no matter how small, should be reported to your caregiver.  After age 40, you should have a clinical breast exam (CBE) every year.  Starting at age 40, you should consider having a mammogram (breast X-ray) every year.  If you have a family history of breast cancer, talk to your caregiver about genetic screening.  If you are at high risk for breast cancer, talk to your caregiver about having an MRI and a mammogram every year.  Intestinal or Stomach Cancer: Tests to consider are a rectal exam, fecal occult blood, sigmoidoscopy, and colonoscopy. Women who are high risk may need to be screened at an earlier age and more often.  Cervical Cancer:  Beginning at age 30, you should have a Pap test every 3 years as long as the past 3 Pap tests have been normal.  If you have had past treatment for cervical cancer or a condition that could lead to cancer, you need Pap tests and screening for cancer for at least 20 years after your treatment.  If you had a hysterectomy for a problem that was not cancer or a condition that could lead to cancer, then you no longer need Pap tests.    If you are between ages 65 and 70, and you have had normal Pap tests going back 10 years, you no longer need Pap tests.  If Pap tests have been discontinued, risk factors (such as a new sexual partner) need to be reassessed to determine if screening should be resumed.  Some medical problems can increase the chance of getting cervical cancer. In these  cases, your caregiver may recommend more frequent screening and Pap tests.  Uterine Cancer: If you have vaginal bleeding after reaching menopause, you should notify your caregiver.  Ovarian cancer: Other than yearly pelvic exams, there are no reliable tests available to screen for ovarian cancer at this time except for yearly pelvic exams.  Lung Cancer: Yearly chest X-rays can detect lung cancer and should be done on high risk women, such as cigarette smokers and women with chronic lung disease (emphysema).  Skin Cancer: A complete body skin exam should be done at your yearly examination. Avoid overexposure to the sun and ultraviolet light lamps. Use a strong sun block cream when in the sun. All of these things are important in lowering the risk of skin cancer. MENOPAUSE Menopause Symptoms: Hormone therapy products are effective for treating symptoms associated with menopause:  Moderate to severe hot flashes.  Night sweats.  Mood swings.  Headaches.  Tiredness.  Loss of sex drive.  Insomnia.  Other symptoms. Hormone replacement carries certain risks, especially in older women. Women who use or are thinking about using estrogen or estrogen with progestin treatments should discuss that with their caregiver. Your caregiver will help you understand the benefits and risks. The ideal dose of hormone replacement therapy is not known. The Food and Drug Administration (FDA) has concluded that hormone therapy should be used only at the lowest doses and for the shortest amount of time to reach treatment goals.  OSTEOPOROSIS Protecting Against Bone Loss and Preventing Fracture: If you use hormone therapy for prevention of bone loss (osteoporosis), the risks for bone loss must outweigh the risk of the therapy. Ask your caregiver about other medications known to be safe and effective for preventing bone loss and fractures. To guard against bone loss or fractures, the following is recommended:  If  you are less than age 50, take 1000 mg of calcium and at least 600 mg of Vitamin D per day.  If you are greater than age 50 but less than age 70, take 1200 mg of calcium and at least 600 mg of Vitamin D per day.  If you are greater than age 70, take 1200 mg of calcium and at least 800 mg of Vitamin D per day. Smoking and excessive alcohol intake increases the risk of osteoporosis. Eat foods rich in calcium and vitamin D and do weight bearing exercises several times a week as your caregiver suggests. DIABETES Diabetes Melitus: If you have Type I or Type 2 diabetes, you should keep your blood sugar under control with diet, exercise and recommended medication. Avoid too many sweets, starchy and fatty foods. Being overweight can make control more difficult. COGNITION AND MEMORY Cognition and Memory: Menopausal hormone therapy is not recommended for the prevention of cognitive disorders such as Alzheimer's disease or memory loss.  DEPRESSION  Depression may occur at any age, but is common in elderly women. The reasons may be because of physical, medical, social (loneliness), or financial problems and needs. If you are experiencing depression because of medical problems and control of symptoms, talk to your caregiver about this. Physical activity and   exercise may help with mood and sleep. Community and volunteer involvement may help your sense of value and worth. If you have depression and you feel that the problem is getting worse or becoming severe, talk to your caregiver about treatment options that are best for you. ACCIDENTS  Accidents are common and can be serious in the elderly woman. Prepare your house to prevent accidents. Eliminate throw rugs, place hand bars in the bath, shower and toilet areas. Avoid wearing high heeled shoes or walking on wet, snowy, and icy areas. Limit or stop driving if you have vision or hearing problems, or you feel you are unsteady with you movements and  reflexes. HEPATITIS C Hepatitis C is a type of viral infection affecting the liver. It is spread mainly through contact with blood from an infected person. It can be treated, but if left untreated, it can lead to severe liver damage over years. Many people who are infected do not know that the virus is in their blood. If you are a "baby-boomer", it is recommended that you have one screening test for Hepatitis C. IMMUNIZATIONS  Several immunizations are important to consider having during your senior years, including:   Tetanus, diptheria, and pertussis booster shot.  Influenza every year before the flu season begins.  Pneumonia vaccine.  Shingles vaccine.  Others as indicated based on your specific needs. Talk to your caregiver about these. Document Released: 12/18/2005 Document Revised: 10/12/2012 Document Reviewed: 08/13/2008 Exit[employer] Patient Information 2014 [employer].  

## 2014-09-10 ENCOUNTER — Encounter: Payer: Self-pay | Admitting: Women's Health

## 2015-01-11 ENCOUNTER — Other Ambulatory Visit: Payer: Self-pay

## 2015-01-11 DIAGNOSIS — Z1231 Encounter for screening mammogram for malignant neoplasm of breast: Secondary | ICD-10-CM

## 2015-01-14 ENCOUNTER — Ambulatory Visit
Admission: RE | Admit: 2015-01-14 | Discharge: 2015-01-14 | Disposition: A | Discharge status: Home / Self Care | Payer: BLUE CROSS/BLUE SHIELD | Source: Ambulatory Visit

## 2015-01-14 DIAGNOSIS — Z1231 Encounter for screening mammogram for malignant neoplasm of breast: Secondary | ICD-10-CM

## 2015-03-01 ENCOUNTER — Other Ambulatory Visit (HOSPITAL_COMMUNITY)
Admission: RE | Admit: 2015-03-01 | Discharge: 2015-03-01 | Disposition: A | Discharge status: Home / Self Care | Payer: BLUE CROSS/BLUE SHIELD | Source: Ambulatory Visit | Attending: Women's Health | Admitting: Women's Health

## 2015-03-01 ENCOUNTER — Encounter: Payer: Self-pay | Admitting: Women's Health

## 2015-03-01 ENCOUNTER — Ambulatory Visit (INDEPENDENT_AMBULATORY_CARE_PROVIDER_SITE_OTHER): Payer: BLUE CROSS/BLUE SHIELD | Admitting: Women's Health

## 2015-03-01 VITALS — BP 130/80 | Ht 61.0 in | Wt 138.0 lb

## 2015-03-01 DIAGNOSIS — Z13820 Encounter for screening for osteoporosis: Secondary | ICD-10-CM | POA: Diagnosis not present

## 2015-03-01 DIAGNOSIS — Z01419 Encounter for gynecological examination (general) (routine) without abnormal findings: Secondary | ICD-10-CM | POA: Insufficient documentation

## 2015-03-01 DIAGNOSIS — Z1151 Encounter for screening for human papillomavirus (HPV): Secondary | ICD-10-CM | POA: Insufficient documentation

## 2015-03-01 DIAGNOSIS — Z79890 Hormone replacement therapy (postmenopausal): Secondary | ICD-10-CM | POA: Diagnosis not present

## 2015-03-01 DIAGNOSIS — M8580 Other specified disorders of bone density and structure, unspecified site: Secondary | ICD-10-CM | POA: Diagnosis not present

## 2015-03-01 MED ORDER — ESTRADIOL 0.0375 MG/24HR TD PTWK: MEDICATED_PATCH | TRANSDERMAL | Status: DC

## 2015-03-01 NOTE — Patient Instructions (Signed)

## 2015-03-01 NOTE — Progress Notes (Signed)
SHAUNE MALACARA June 27, 1956 [id number]    History:    Presents for annual exam.  2004 TAH with BSO for fibroids on Climara patch 0.0375 weekly. 2007 VAIN 3  CO2 laser with normal Paps after. 2007 negative colonoscopy. 06/2013 T score -1.5 at spine, -1.1 and hip FRAX 3.1%/0.3%. Normal mammogram history. Primary care manages hypertension. Smoker less than one pack daily. Shingles on face and head 2015.  Past medical history, past surgical history, family history and social history were all reviewed and documented in the EPIC chart. 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  ROS:  A ROS was performed and pertinent positives and negatives are included.  Exam:  Filed Vitals:   03/01/15 1103  BP: 130/80    General appearance:  Normal Thyroid:  Symmetrical, normal in size, without palpable masses or nodularity. Respiratory  Auscultation:  Clear without wheezing or rhonchi Cardiovascular  Auscultation:  Regular rate, without rubs, murmurs or gallops  Edema/varicosities:  Not grossly evident Abdominal  Soft,nontender, without masses, guarding or rebound.  Liver/spleen:  No organomegaly noted  Hernia:  None appreciated  Skin  Inspection:  Grossly normal   Breasts: Examined lying and sitting.     Right: Without masses, retractions, discharge or axillary adenopathy.     Left: Without masses, retractions, discharge or axillary adenopathy. Gentitourinary   Inguinal/mons:  Normal without inguinal adenopathy  External genitalia:  Normal  BUS/Urethra/Skene's glands:  Normal  Vagina:  Normal  Cervix:  Absent  Uterus: Absent  Adnexa/parametria:     Rt: Without masses or tenderness.   Lt: Without masses or tenderness.  Anus and perineum: Normal  Digital rectal exam: Normal sphincter tone without palpated masses or tenderness  Assessment/Plan:  59 y.o.SBF G1P1  for annual exam with no complaints.   2004 TAH with BSO for fibroids on Climara 0.0375  patch weekly 2007 VAIN 3 laser treatment - normal Paps after Osteopenia without elevated FRAX Hypertension primary care manages labs and meds Smoker  Plan: 1. Climara 0.037 patch weekly prescription, proper use, risk for blood clots strokes and breast cancer reviewed, reports is decreasing use, continues to have numerous hot flushes if not using. 2. UA, Pap with HR HPV typing, reviewed new Pap screening guidelines.  3. Repeat DEXA in August, home safety, fall prevention and importance of regular weightbearing exercise reviewed. 4. Aware of hazards of smoking is trying to decrease. Recommended Zostavax, shingles this past year. Encouraged Pneumovax at primary care.   Huel Cote WHNP, 1:04 PM 03/01/2015

## 2015-03-02 LAB — URINALYSIS W MICROSCOPIC + REFLEX CULTURE
BACTERIA UA: NONE SEEN
BILIRUBIN URINE: NEGATIVE
CASTS: NONE SEEN
CRYSTALS: NONE SEEN
Glucose, UA: NEGATIVE mg/dL
Hgb urine dipstick: NEGATIVE
KETONES UR: NEGATIVE mg/dL
Leukocytes, UA: NEGATIVE
Nitrite: NEGATIVE
PH: 7 (ref 5.0–8.0)
Protein, ur: NEGATIVE mg/dL
SQUAMOUS EPITHELIAL / LPF: NONE SEEN
Specific Gravity, Urine: 1.005 (ref 1.005–1.030)
Urobilinogen, UA: 0.2 mg/dL (ref 0.0–1.0)

## 2015-03-06 LAB — CYTOLOGY - PAP

## 2015-03-13 ENCOUNTER — Other Ambulatory Visit: Payer: Self-pay | Admitting: *Deleted

## 2015-03-13 DIAGNOSIS — Z79890 Hormone replacement therapy (postmenopausal): Secondary | ICD-10-CM

## 2015-03-13 MED ORDER — ESTRADIOL 0.0375 MG/24HR TD PTWK: MEDICATED_PATCH | TRANSDERMAL | Status: DC

## 2015-05-27 ENCOUNTER — Other Ambulatory Visit: Payer: Self-pay | Admitting: Gastroenterology

## 2015-09-06 ENCOUNTER — Other Ambulatory Visit: Payer: Self-pay | Admitting: Allergy and Immunology

## 2015-09-07 LAB — T3: T3 TOTAL: 224 ng/dL — AB (ref 71–180)

## 2015-09-07 LAB — TSH+FREE T4
Free T4: 1.23 ng/dL (ref 0.82–1.77)
TSH: 0.911 u[IU]/mL (ref 0.450–4.500)

## 2015-09-07 LAB — THYROID PEROXIDASE ANTIBODY: THYROID PEROXIDASE ANTIBODY: 15 [IU]/mL (ref 0–34)

## 2015-09-17 ENCOUNTER — Telehealth: Payer: Self-pay | Admitting: *Deleted

## 2015-09-17 NOTE — Telephone Encounter (Signed)
CALLED PATIENT AND ADVISED PER DR KOZLOW THAT THYROID TESTS STILL SLIGHTLY ABNORMAL TO RECHECK ALL 3 TESTS IN 8 WEEKS. PT UNDERSTOOD

## 2015-09-25 ENCOUNTER — Ambulatory Visit (INDEPENDENT_AMBULATORY_CARE_PROVIDER_SITE_OTHER): Payer: BLUE CROSS/BLUE SHIELD | Admitting: Allergy and Immunology

## 2015-09-25 ENCOUNTER — Encounter: Payer: Self-pay | Admitting: Allergy and Immunology

## 2015-09-25 VITALS — BP 130/78 | HR 80 | Resp 18

## 2015-09-25 DIAGNOSIS — B199 Unspecified viral hepatitis without hepatic coma: Secondary | ICD-10-CM | POA: Insufficient documentation

## 2015-09-25 DIAGNOSIS — B1920 Unspecified viral hepatitis C without hepatic coma: Secondary | ICD-10-CM | POA: Diagnosis not present

## 2015-09-25 DIAGNOSIS — F17210 Nicotine dependence, cigarettes, uncomplicated: Secondary | ICD-10-CM | POA: Diagnosis not present

## 2015-09-25 DIAGNOSIS — L500 Allergic urticaria: Secondary | ICD-10-CM

## 2015-09-25 NOTE — Progress Notes (Signed)
[facility]  Follow-up Note  Refering Provider: No ref. provider found Primary Provider: Bennetta Laos, MD  Subjective:   Brittany Duffy is a 59 y.o. female who returns to the [facility] [place] in re-evaluation of the following:  HPI Comments:  Brittany Duffy returns to this clinic in evaluation of her urticaria and allergic rhinitis and hepatitis C infection. Overall her skin is doing relatively well although she still occasionally gets itchy. She's using cetirizine 10 mg daily. She has no other associated systemic or constitutional symptoms with her urticaria. Her reflux is under pretty good control while consolidating her caffeine. Her nose is been doing quite well while using Rhinocort. She still continues to smoke. Brittany Duffy tells me that she will have evaluation in January with the hepatitis group.   Outpatient Encounter Prescriptions as of 09/25/2015  Medication Sig  . ALPRAZolam (XANAX PO) Take by mouth as needed.   Marland Kitchen AmLODIPine Besylate (NORVASC PO) Take by mouth.    . Ascorbic Acid (VITAMIN C PO) Take 1 tablet by mouth daily.   . budesonide (RHINOCORT ALLERGY) 32 MCG/ACT nasal spray Place 1 spray into both nostrils daily.  Marland Kitchen CALCIUM PO Take 1 tablet by mouth daily.   Marland Kitchen estradiol (CLIMARA - DOSED IN MG/24 HR) 0.0375 mg/24hr patch PLACE 1 PATCH (0.0375 MG TOTAL) ONTO THE SKIN ONCE A WEEK.  . fish oil-omega-3 fatty acids 1000 MG capsule Take 2 g by mouth daily.    . Multiple Vitamin (MULTIVITAMIN PO) Take 1 tablet by mouth daily.   Marland Kitchen VITAMIN E PO Take 1 tablet by mouth daily.   . [DISCONTINUED] fluticasone (FLONASE) 50 MCG/ACT nasal spray Place 2 sprays into the nose daily.   No facility-administered encounter medications on file as of 09/25/2015.    No orders of the defined types were placed in this encounter.    Past Medical History  Diagnosis Date  . Fibroid   . Dysplasia of cervix, low grade  (CIN 1)     S/P CRYO  . Vaginal intraepithelial neoplasia III (VAIN III)   . Osteopenia   . Hypertension   . Bronchitis     Past Surgical History  Procedure Laterality Date  . Diverticulum of the bladder    . Total abdominal hysterectomy  2004    BSO  . Carpal tunnel release    . Co2 lsaer of vagina  07, 11  . Oophorectomy      BSO    No Known Allergies  Review of Systems  Constitutional: Negative.   HENT: Negative.   Eyes: Negative.   Respiratory: Negative.   Cardiovascular: Negative.   Gastrointestinal: Negative.   Musculoskeletal: Negative.   Skin: Negative.      Objective:   Filed Vitals:   09/25/15 1137  BP: 130/78  Pulse: 80  Resp: 18          Physical Exam  Constitutional: She appears well-developed and well-nourished. No distress.  HENT:  Head: Normocephalic and atraumatic. Head is without right periorbital erythema and without left periorbital erythema.  Right Ear: Tympanic membrane, external ear and ear canal normal. No drainage or tenderness. No foreign bodies. Tympanic membrane is not injected, not scarred, not perforated, not erythematous, not retracted and not bulging. No middle ear effusion.  Left Ear: Tympanic membrane, external ear and ear canal normal. No drainage or tenderness. No foreign bodies. Tympanic membrane is not injected, not scarred, not perforated, not erythematous, not retracted and  not bulging.  No middle ear effusion.  Nose: Nose normal. No mucosal edema, rhinorrhea, nose lacerations or sinus tenderness.  No foreign bodies.  Mouth/Throat: Oropharynx is clear and moist. No oropharyngeal exudate, posterior oropharyngeal edema, posterior oropharyngeal erythema or tonsillar abscesses.  Eyes: Lids are normal. Right eye exhibits no chemosis, no discharge and no exudate. No foreign body present in the right eye. Left eye exhibits no chemosis, no discharge and no exudate. No foreign body present in the left eye. Right conjunctiva is not  injected. Left conjunctiva is not injected.  Neck: Neck supple. No tracheal tenderness present. No tracheal deviation and no edema present. No thyroid mass and no thyromegaly present.  Cardiovascular: Normal rate, regular rhythm, S1 normal and S2 normal.  Exam reveals no gallop.   No murmur heard. Pulmonary/Chest: No accessory muscle usage or stridor. No respiratory distress. She has no wheezes. She has no rhonchi. She has no rales.  Abdominal: Soft.  Lymphadenopathy:       Head (right side): No tonsillar adenopathy present.       Head (left side): No tonsillar adenopathy present.    She has no cervical adenopathy.  Neurological: She is alert.  Skin: No rash noted. She is not diaphoretic.  Psychiatric: She has a normal mood and affect. Her behavior is normal.    Diagnostics: None    Assessment and Plan:   1. Allergic urticaria   2. Hepatitis C virus infection, unspecified chronicity   3. Cigarette smoker one half pack a day or less      1. Increase cetirizine to 10 mg two times per day  2. Continue Rhinocort one spray each nostril one time per day  3. Follow through with treatment for Hepatitis C  4. Return to clinic in 12 weeks  5. Stop smoking.  Overall: Is done relatively well and she can use cetirizine 10-20 mg per day and continue Rhinocort as prescribed.I think that the big issue for Keisa is that she has hepatitis C infection and if she can get this under good control I think that her other issues including her urticaria will probably come under control.     Allena Katz, MD [place]

## 2015-09-25 NOTE — Patient Instructions (Signed)
  1. Increase cetirizine to 10 mg two times per day  2. Continue Rhinocort one spray each nostril one time per day  3. Follow through with treatment for Hepatitis C  4. Return to clinic in 12 weeks  5. Stop smoking.

## 2015-11-11 ENCOUNTER — Encounter (HOSPITAL_COMMUNITY): Payer: Self-pay | Admitting: *Deleted

## 2015-11-11 ENCOUNTER — Emergency Department (HOSPITAL_COMMUNITY)
Admission: EM | Admit: 2015-11-11 | Discharge: 2015-11-12 | Disposition: A | Discharge status: Home / Self Care | Payer: BLUE CROSS/BLUE SHIELD | Attending: Emergency Medicine | Admitting: Emergency Medicine

## 2015-11-11 DIAGNOSIS — Z86018 Personal history of other benign neoplasm: Secondary | ICD-10-CM | POA: Diagnosis not present

## 2015-11-11 DIAGNOSIS — Z79899 Other long term (current) drug therapy: Secondary | ICD-10-CM | POA: Diagnosis not present

## 2015-11-11 DIAGNOSIS — Z8742 Personal history of other diseases of the female genital tract: Secondary | ICD-10-CM | POA: Diagnosis not present

## 2015-11-11 DIAGNOSIS — Y9389 Activity, other specified: Secondary | ICD-10-CM | POA: Diagnosis not present

## 2015-11-11 DIAGNOSIS — Y929 Unspecified place or not applicable: Secondary | ICD-10-CM | POA: Diagnosis not present

## 2015-11-11 DIAGNOSIS — Y999 Unspecified external cause status: Secondary | ICD-10-CM | POA: Diagnosis not present

## 2015-11-11 DIAGNOSIS — S61210A Laceration without foreign body of right index finger without damage to nail, initial encounter: Secondary | ICD-10-CM | POA: Insufficient documentation

## 2015-11-11 DIAGNOSIS — F17210 Nicotine dependence, cigarettes, uncomplicated: Secondary | ICD-10-CM | POA: Diagnosis not present

## 2015-11-11 DIAGNOSIS — Z8619 Personal history of other infectious and parasitic diseases: Secondary | ICD-10-CM | POA: Insufficient documentation

## 2015-11-11 DIAGNOSIS — Z23 Encounter for immunization: Secondary | ICD-10-CM | POA: Insufficient documentation

## 2015-11-11 DIAGNOSIS — S81832A Puncture wound without foreign body, left lower leg, initial encounter: Secondary | ICD-10-CM | POA: Insufficient documentation

## 2015-11-11 DIAGNOSIS — T148XXA Other injury of unspecified body region, initial encounter: Secondary | ICD-10-CM

## 2015-11-11 DIAGNOSIS — Z7951 Long term (current) use of inhaled steroids: Secondary | ICD-10-CM | POA: Diagnosis not present

## 2015-11-11 DIAGNOSIS — W540XXA Bitten by dog, initial encounter: Secondary | ICD-10-CM | POA: Diagnosis not present

## 2015-11-11 DIAGNOSIS — S8992XA Unspecified injury of left lower leg, initial encounter: Secondary | ICD-10-CM | POA: Diagnosis present

## 2015-11-11 DIAGNOSIS — Z8709 Personal history of other diseases of the respiratory system: Secondary | ICD-10-CM | POA: Diagnosis not present

## 2015-11-11 DIAGNOSIS — I10 Essential (primary) hypertension: Secondary | ICD-10-CM | POA: Insufficient documentation

## 2015-11-11 DIAGNOSIS — IMO0002 Reserved for concepts with insufficient information to code with codable children: Secondary | ICD-10-CM

## 2015-11-11 HISTORY — DX: Unspecified viral hepatitis C without hepatic coma: B19.20

## 2015-11-11 MED ORDER — LIDOCAINE HCL 2 % IJ SOLN
10.0000 mL | Freq: Once | INTRAMUSCULAR | Status: AC
  Administered 2015-11-12: 200 mg via INTRADERMAL
  Filled 2015-11-11: qty 20

## 2015-11-11 NOTE — ED Notes (Signed)
Pt states that her brothers dog bit her when she was feeding him. Has puncture to right hand pointer finger and left leg. Pt does not think the dog has had rabies shots. Pt states that she had tetanus shot 2 years ago.

## 2015-11-11 NOTE — ED Provider Notes (Signed)
CSN: [id number]     Arrival date & time 11/11/15  2256 History  By signing my name below, I, Starleen Arms, attest that this documentation has been prepared under the direction and in the presence of Wendie Simmer, PA-C. Electronically Signed: Starleen Arms ED Scribe. 11/11/2015. 11:28 PM.    Chief Complaint  Patient presents with  . Animal Bite   The history is provided by the patient. No language interpreter was used.   HPI Comments: Brittany Duffy is a 60 y.o. female who presents to the Emergency Department complaining of a dog bite that occurred PTA as she was trying to feed her brother's dog.  She notes a puncture wound on the left shin and several small lacerations on the right pointer finger.  The dog's vaccinations are not up to date and the dog is kept in a fenced in back yard.  The patient believes her last tetanus was two years ago but states she would like another vaccination "to be safe."  She denies other complaints, LOC, visual changes, CP, SOB, n/v, seizures, tremors.   Past Medical History  Diagnosis Date  . Fibroid   . Dysplasia of cervix, low grade (CIN 1)     S/P CRYO  . Vaginal intraepithelial neoplasia III (VAIN III)   . Osteopenia   . Hypertension   . Bronchitis   . Hepatitis C    Past Surgical History  Procedure Laterality Date  . Diverticulum of the bladder    . Total abdominal hysterectomy  2004    BSO  . Carpal tunnel release    . Co2 lsaer of vagina  07, 11  . Oophorectomy      BSO   Family History  Problem Relation Age of Onset  . Diabetes Mother   . Hypertension Mother   . Breast cancer Mother     Age Late 68's  . Hypertension Maternal Grandfather    Social History  Substance Use Topics  . Smoking status: Current Every Day Smoker -- 1.50 packs/day    Types: Cigarettes  . Smokeless tobacco: Current User  . Alcohol Use: No     Comment: occ.   OB History    Gravida Para Term Preterm AB TAB SAB Ectopic Multiple Living   2 1 1  1 1    1       Review of Systems 10 Systems reviewed and all are negative for acute change except as noted in the HPI.   Allergies  Review of patient's allergies indicates no known allergies.  Home Medications   Prior to Admission medications   Medication Sig Start Date End Date Taking? Authorizing Provider  ALPRAZolam (XANAX PO) Take by mouth as needed.     Historical Provider, MD  AmLODIPine Besylate (NORVASC PO) Take by mouth.      Historical Provider, MD  Ascorbic Acid (VITAMIN C PO) Take 1 tablet by mouth daily.     Historical Provider, MD  budesonide (RHINOCORT ALLERGY) 32 MCG/ACT nasal spray Place 1 spray into both nostrils daily.    Historical Provider, MD  CALCIUM PO Take 1 tablet by mouth daily.     Historical Provider, MD  estradiol (CLIMARA - DOSED IN MG/24 HR) 0.0375 mg/24hr patch PLACE 1 PATCH (0.0375 MG TOTAL) ONTO THE SKIN ONCE A WEEK. 03/13/15   Huel Cote, NP  fish oil-omega-3 fatty acids 1000 MG capsule Take 2 g by mouth daily.      Historical Provider, MD  Multiple Vitamin (MULTIVITAMIN PO)  Take 1 tablet by mouth daily.     Historical Provider, MD  VITAMIN E PO Take 1 tablet by mouth daily.     Historical Provider, MD   BP 154/100 mmHg  Pulse 110  Temp(Src) 97.8 F (36.6 C) (Oral)  Resp 18  SpO2 98%  LMP 01/23/2012 Physical Exam  Constitutional: She is oriented to person, place, and time. She appears well-developed and well-nourished. No distress.  HENT:  Head: Normocephalic and atraumatic.  Mouth/Throat: Oropharynx is clear and moist. No oropharyngeal exudate.  Eyes: Conjunctivae and EOM are normal. Pupils are equal, round, and reactive to light. Right eye exhibits no discharge. Left eye exhibits no discharge. No scleral icterus.  Neck: Neck supple. No tracheal deviation present.  Cardiovascular: Normal rate, regular rhythm, normal heart sounds and intact distal pulses.  Exam reveals no gallop and no friction rub.   No murmur heard. Pulmonary/Chest: Effort normal  and breath sounds normal. No respiratory distress. She has no wheezes. She has no rales. She exhibits no tenderness.  Abdominal: Soft. Bowel sounds are normal. She exhibits no distension and no mass. There is no tenderness. There is no rebound and no guarding.  Musculoskeletal: Normal range of motion. She exhibits no edema.  Lymphadenopathy:    She has no cervical adenopathy.  Neurological: She is alert and oriented to person, place, and time. Coordination normal.  Skin: Skin is warm and dry. No rash noted. She is not diaphoretic. No erythema.  One linear laceration on the dorsal side of left pointer finger and two on the ventral distal portion of pointer finger.  Puncture wound on left anterior shin.  Psychiatric: She has a normal mood and affect. Her behavior is normal.  Nursing note and vitals reviewed.   ED Course  Procedures   LACERATION REPAIR Performed by: Tracie Harrier Authorized by: Tracie Harrier Consent: Verbal consent obtained. Risks and benefits: risks, benefits and alternatives were discussed Consent given by: patient Patient identity confirmed: provided demographic data Prepped and Draped in normal sterile fashion Wound explored  Laceration Location: left index finger and left anterior shin  Laceration Length: 1.5 cm, 0.5 cm, 0.5 cm, 0.5 cm  No Foreign Bodies seen or palpated  Anesthesia: digital block, local infiltration  Local anesthetic: lidocaine 2% without epinephrine  Anesthetic total: 5 ml  Irrigation method: syringe Amount of cleaning: extensive  Skin closure: 5-0 prolene  Number of sutures: 12  Technique: simple interrupted  Patient tolerance: Patient tolerated the procedure well with no immediate complications.   DIAGNOSTIC STUDIES: Oxygen Saturation is 98% on RA, normal by my interpretation.    COORDINATION OF CARE:  11:26 PM Discussed treatment plan with patient at bedside.  Patient acknowledges and agrees with plan.     MDM    Final diagnoses:  Laceration  Animal bite   Patient non-toxic appearing and VSS. Patient refusing lidocaine injection initially. Patient not numb after [id number] min of LET application. Patient agrees to lidocaine injection. Wounds cleaned, sutured, and dressed. tdap ordered. Patient may be safely discharged home. Discussed reasons for return. Patient to have sutures removed in 10 days. Patient to follow-up with primary care provider within one week. Patient in understanding and agreement with the plan.  I personally performed the services described in this documentation, which was scribed in my presence. The recorded information has been reviewed and is accurate.   [place] Lions, PA-C 11/17/15 2131  Noemi Chapel, MD 11/19/15 1146

## 2015-11-12 MED ORDER — LIDOCAINE-EPINEPHRINE-TETRACAINE (LET) SOLUTION
3.0000 mL | Freq: Once | NASAL | Status: AC
  Administered 2015-11-12: 3 mL via TOPICAL
  Filled 2015-11-12: qty 3

## 2015-11-12 MED ORDER — TETANUS-DIPHTH-ACELL PERTUSSIS 5-2.5-18.5 LF-MCG/0.5 IM SUSP
0.5000 mL | Freq: Once | INTRAMUSCULAR | Status: AC
  Administered 2015-11-12: 0.5 mL via INTRAMUSCULAR
  Filled 2015-11-12: qty 0.5

## 2015-11-12 MED ORDER — AMOXICILLIN-POT CLAVULANATE 875-125 MG PO TABS: 1.0000 | ORAL_TABLET | Freq: Two times a day (BID) | ORAL | Status: DC

## 2015-11-12 NOTE — ED Notes (Signed)
PA at bedside performing suture repair. 

## 2015-11-12 NOTE — ED Notes (Signed)
Registration at bedside completing Animal Control notification report.

## 2015-11-12 NOTE — Discharge Instructions (Signed)
Ms. CHAWN RACKLIFF,  Nice meeting you! Please follow-up with your primary care provider. Your sutures need to come out in 10-14 days. Take all of your antibiotics as prescribed. Return to the emergency department if you develop fevers, chills, yellow/green drainage from the area, redness at the wound sites. Feel better soon!  S. Wendie Simmer, PA-C

## 2015-12-14 DIAGNOSIS — F17200 Nicotine dependence, unspecified, uncomplicated: Secondary | ICD-10-CM | POA: Insufficient documentation

## 2015-12-14 DIAGNOSIS — B182 Chronic viral hepatitis C: Secondary | ICD-10-CM | POA: Insufficient documentation

## 2015-12-31 ENCOUNTER — Ambulatory Visit (INDEPENDENT_AMBULATORY_CARE_PROVIDER_SITE_OTHER): Payer: BLUE CROSS/BLUE SHIELD | Admitting: Allergy and Immunology

## 2015-12-31 VITALS — BP 138/90 | HR 92 | Resp 24

## 2015-12-31 DIAGNOSIS — L500 Allergic urticaria: Secondary | ICD-10-CM

## 2015-12-31 DIAGNOSIS — B1920 Unspecified viral hepatitis C without hepatic coma: Secondary | ICD-10-CM

## 2015-12-31 DIAGNOSIS — H1010 Acute atopic conjunctivitis, unspecified eye: Secondary | ICD-10-CM | POA: Diagnosis not present

## 2015-12-31 DIAGNOSIS — J309 Allergic rhinitis, unspecified: Secondary | ICD-10-CM | POA: Diagnosis not present

## 2015-12-31 DIAGNOSIS — F17210 Nicotine dependence, cigarettes, uncomplicated: Secondary | ICD-10-CM | POA: Diagnosis not present

## 2015-12-31 NOTE — Progress Notes (Signed)
Follow-up Note  Referring Provider: No ref. provider found Primary Provider: [name], FNP Date of Office Visit: 12/31/2015  Subjective:   Brittany Duffy (DOB: 1956-04-22) is a 60 y.o. female who returns to the [facility] on 12/31/2015 in re-evaluation of the following:  HPI Comments: Raziya returns to this clinic in reevaluation of her pruritus and urticaria and hepatitis C infection and allergic rhinitis. She is done relatively well since I last seen her in his clinic in November while consistently using her cetirizine 10 mg twice a day regarding her pruritus and urticaria. As well, she thinks that her nose is doing very well but she is developed some bleeding of her nose in the past several months. It should be noted that she increased her Rhinocort to 2 sprays each nostril twice a day. She has an appointment to see the hepatitis C treatment group in April.   Current Outpatient Prescriptions on File Prior to Visit  Medication Sig Dispense Refill  . ALPRAZolam (XANAX PO) Take by mouth as needed.     Marland Kitchen AmLODIPine Besylate (NORVASC PO) Take by mouth.      . Ascorbic Acid (VITAMIN C PO) Take 1 tablet by mouth daily.     . budesonide (RHINOCORT ALLERGY) 32 MCG/ACT nasal spray Place 1 spray into both nostrils daily.    Marland Kitchen estradiol (CLIMARA - DOSED IN MG/24 HR) 0.0375 mg/24hr patch PLACE 1 PATCH (0.0375 MG TOTAL) ONTO THE SKIN ONCE A WEEK. 12 patch 4  . fish oil-omega-3 fatty acids 1000 MG capsule Take 2 g by mouth daily.      . Multiple Vitamin (MULTIVITAMIN PO) Take 1 tablet by mouth daily.     Marland Kitchen VITAMIN E PO Take 1 tablet by mouth daily.     Marland Kitchen amoxicillin-clavulanate (AUGMENTIN) 875-125 MG tablet Take 1 tablet by mouth 2 (two) times daily. 14 tablet 0  . CALCIUM PO Take 1 tablet by mouth daily.      No current facility-administered medications on file prior to visit.    Past Medical History  Diagnosis Date  . Fibroid   . Dysplasia of cervix, low  grade (CIN 1)     S/P CRYO  . Vaginal intraepithelial neoplasia III (VAIN III)   . Osteopenia   . Hypertension   . Bronchitis   . Hepatitis C     Past Surgical History  Procedure Laterality Date  . Diverticulum of the bladder    . Total abdominal hysterectomy  2004    BSO  . Carpal tunnel release    . Co2 lsaer of vagina  07, 11  . Oophorectomy      BSO    No Known Allergies  Review of systems negative except as noted in HPI / PMHx or noted below:  Review of Systems  Constitutional: Negative.   HENT: Negative.   Eyes: Negative.   Respiratory: Negative.   Cardiovascular: Negative.   Gastrointestinal: Negative.   Genitourinary: Negative.   Musculoskeletal: Negative.   Skin: Negative.   Neurological: Negative.   Endo/Heme/Allergies: Negative.   Psychiatric/Behavioral: Negative.      Objective:   Filed Vitals:   12/31/15 1202  BP: 138/90  Pulse: 92  Resp: 24          Physical Exam  Constitutional: She is well-developed, well-nourished, and in no distress.  HENT:  Head: Normocephalic.  Right Ear: Tympanic membrane, external ear and ear canal normal.  Left Ear: Tympanic membrane, external ear  and ear canal normal.  Nose: No mucosal edema or rhinorrhea. Epistaxis (Excoriated nasal septum bilaterally) is observed.  Mouth/Throat: Uvula is midline, oropharynx is clear and moist and mucous membranes are normal. No oropharyngeal exudate.  Eyes: Conjunctivae are normal.  Neck: Trachea normal. No tracheal tenderness present. No tracheal deviation present. No thyromegaly present.  Cardiovascular: Normal rate, regular rhythm, S1 normal, S2 normal and normal heart sounds.   No murmur heard. Pulmonary/Chest: Breath sounds normal. No stridor. No respiratory distress. She has no wheezes. She has no rales.  Musculoskeletal: She exhibits no edema.  Lymphadenopathy:       Head (right side): No tonsillar adenopathy present.       Head (left side): No tonsillar adenopathy  present.    She has no cervical adenopathy.    She has no axillary adenopathy.  Neurological: She is alert. Gait normal.  Skin: No rash noted. She is not diaphoretic. No erythema. Nails show no clubbing.  Psychiatric: Mood and affect normal.    Diagnostics: None   Assessment and Plan:   1. Allergic urticaria   2. Allergic rhinoconjunctivitis   3. Hepatitis C virus infection, unspecified chronicity   4. Cigarette smoker one half pack a day or less     1. Increase cetirizine to 10 mg two times per day  2. Decrease Rhinocort one spray each nostril one time per day. None this week  3. Follow through with treatment for Hepatitis C  4. Return to clinic in 12 weeks  5. Stop smoking.  Overall Brittany Duffy has done relatively well and I see no need for changing her medical therapy at this point in time but certainly she needs to decrease her record use as she is getting some epistaxis from using this medication. Hopefully when she sees the hepatitis C treatment group she will receive a therapy that addresses her chronic hepatitis C infection which they help her pruritus and urticaria. I'll see her back in this clinic in 12 weeks or earlier if there is a problem.  Allena Katz, MD [place]

## 2015-12-31 NOTE — Patient Instructions (Addendum)
  1. Increase cetirizine to 10 mg two times per day  2. Decrease Rhinocort one spray each nostril one time per day. None this week and can use over-the-counter nasal saline spray multiple times a day  3. Follow through with treatment for Hepatitis C  4. Return to clinic in 12 weeks  5. Stop smoking.

## 2016-01-01 ENCOUNTER — Encounter: Payer: Self-pay | Admitting: Allergy and Immunology

## 2016-01-03 ENCOUNTER — Other Ambulatory Visit: Payer: Self-pay

## 2016-01-03 DIAGNOSIS — Z1231 Encounter for screening mammogram for malignant neoplasm of breast: Secondary | ICD-10-CM

## 2016-01-08 DIAGNOSIS — M8580 Other specified disorders of bone density and structure, unspecified site: Secondary | ICD-10-CM

## 2016-01-08 HISTORY — DX: Other specified disorders of bone density and structure, unspecified site: M85.80

## 2016-01-14 ENCOUNTER — Other Ambulatory Visit: Payer: Self-pay | Admitting: Gynecology

## 2016-01-14 ENCOUNTER — Ambulatory Visit (INDEPENDENT_AMBULATORY_CARE_PROVIDER_SITE_OTHER): Payer: BLUE CROSS/BLUE SHIELD

## 2016-01-14 ENCOUNTER — Encounter: Payer: Self-pay | Admitting: Gynecology

## 2016-01-14 DIAGNOSIS — M899 Disorder of bone, unspecified: Secondary | ICD-10-CM | POA: Diagnosis not present

## 2016-01-14 DIAGNOSIS — Z13820 Encounter for screening for osteoporosis: Secondary | ICD-10-CM | POA: Diagnosis not present

## 2016-01-14 DIAGNOSIS — M8580 Other specified disorders of bone density and structure, unspecified site: Secondary | ICD-10-CM

## 2016-01-15 ENCOUNTER — Ambulatory Visit: Payer: BLUE CROSS/BLUE SHIELD

## 2016-01-23 ENCOUNTER — Ambulatory Visit
Admission: RE | Admit: 2016-01-23 | Discharge: 2016-01-23 | Disposition: A | Discharge status: Home / Self Care | Payer: BLUE CROSS/BLUE SHIELD | Source: Ambulatory Visit

## 2016-01-23 DIAGNOSIS — Z1231 Encounter for screening mammogram for malignant neoplasm of breast: Secondary | ICD-10-CM

## 2016-02-04 ENCOUNTER — Ambulatory Visit (INDEPENDENT_AMBULATORY_CARE_PROVIDER_SITE_OTHER): Payer: BLUE CROSS/BLUE SHIELD | Admitting: Podiatry

## 2016-02-04 ENCOUNTER — Encounter: Payer: Self-pay | Admitting: Podiatry

## 2016-02-04 VITALS — BP 143/80 | HR 92 | Resp 12

## 2016-02-04 DIAGNOSIS — M2042 Other hammer toe(s) (acquired), left foot: Secondary | ICD-10-CM

## 2016-02-04 DIAGNOSIS — L84 Corns and callosities: Secondary | ICD-10-CM

## 2016-02-04 NOTE — Progress Notes (Signed)
   Subjective:    Patient ID: Brittany Duffy, female    DOB: 13-Nov-1955, 61 y.o.   MRN: [id number]  HPI  N-THICK SKIN, SORE L-LT FOOT 4TH, 5TH TOE D-15 YEARS O-SLOWLY C-WORSE A-PRESSURE T-RAZOR BLADE  This patient presents today with a multiple years history of a painful skin lesion on the fourth and fifth left toes they have become gradually more comfortable last several months. Patient trimmed disease corns at 4 - 6 -week intervals which provides some temporary relief. Also, patient was in the office 5+ years ago for the same problem at that time the areas were debrided back with acid. Patient is requesting that these areas are debrided and packed with acid  Patient has manufacturing job requiring continuous standing walking  Review of Systems  Skin: Positive for color change.       Objective:   Physical Exam  Orientated 3  Vascular: DP pulses 2/4 bilaterally PT pulses 2/4 bilaterally Capillary reflex immediate bilaterally  Neurological: Sensation to 10 g monofilament wire intact 5/5 bilaterally Vibratory sensation equal and reactive bilaterally Ankle reflex equal and reactive bilaterally  Dermatological: No open skin lesions bilaterally Nucleated keratoses lateral fourth left toe and lateral fifth left toe  Musculoskeletal: HAV left Adductovarus fifth toes bilaterally      Assessment & Plan:   Assessment: Satisfactory neurovascular status Hammertoe fifth bilaterally Keratoses 2 left  Plan: Discuss treatment options including repetitive debridement versus possible surgical intervention. Patient is interested in debridement only today. Keratoses 2 left foot were debrided without any bleeding Keratoses lateral fourth left toe packed with salinocaine  Reappoint at patient's request

## 2016-02-04 NOTE — Patient Instructions (Signed)
Corns and Calluses Corns are small areas of thickened skin that occur on the top, sides, or tip of a toe. They contain a cone-shaped core with a point that can press on a nerve below. This causes pain. Calluses are areas of thickened skin that can occur anywhere on the body including hands, fingers, palms, soles of the feet, and heels.Calluses are usually larger than corns.  CAUSES  Corns and calluses are caused by rubbing (friction) or pressure, such as from shoes that are too tight or do not fit properly.  RISK FACTORS Corns are more likely to develop in people who have toe deformities, such as hammer toes. Since calluses can occur with friction to any area of the skin, calluses are more likely to develop in people who:   Work with their hands.  Wear shoes that fit poorly, shoes that are too tight, or shoes that are high-heeled.  Have toes deformities. SYMPTOMS Symptoms of a corn or callus include:  A hard growth on the skin.   Pain or tenderness under the skin.   Redness and swelling.   Increased discomfort while wearing tight-fitting shoes. DIAGNOSIS  Corns and calluses may be diagnosed with a medical history and physical exam.  TREATMENT  Corns and calluses may be treated with:  Removing the cause of the friction or pressure. This may include:  Changing your shoes.  Wearing shoe inserts (orthotics) or other protective layers in your shoes, such as a corn pad.  Wearing gloves.  Medicines to help soften skin in the hardened, thickened areas.  Reducing the size of the corn or callus by removing the dead layers of skin.  Antibiotic medicines to treat infection.  Surgery, if a toe deformity is the cause. HOME CARE INSTRUCTIONS   Take medicines only as directed by your health care provider.  If you were prescribed an antibiotic, finish all of it even if you start to feel better.  Wear shoes that fit well. Avoid wearing high-heeled shoes and shoes that are  too tight or too loose.  Wear any padding, protective layers, gloves, or orthotics as directed by your health care provider.  Soak your hands or feet and then use a file or pumice stone to soften your corn or callus. Do this as directed by your health care provider.  Check your corn or callus every day for signs of infection. Watch for:  Redness, swelling, or pain.  Fluid, blood, or pus. SEEK MEDICAL CARE IF:   Your symptoms do not improve with treatment.  You have increased redness, swelling, or pain at the site of your corn or callus.  You have fluid, blood, or pus coming from your corn or callus.  You have new symptoms.   This information is not intended to replace advice given to you by your health care provider. Make sure you discuss any questions you have with your health care provider.   Document Released: 08/01/2004 Document Revised: 03/12/2015 Document Reviewed: 10/22/2014 Elsevier Interactive Patient Education 2016 [place] toes is a condition in which one or more of your toes is permanently flexed. CAUSES  This happens when a muscle imbalance or abnormal bone length makes your small toes buckle. This causes the toe joint to contract and the strong cord-like bands that attach muscles to the bones (tendons) in your toes to shorten.  SIGNS AND SYMPTOMS  Common symptoms of flexible hammer toes include:   A buildup of skin cells (corns). Corns occur  where boney bumps come in frequent contact with hard surfaces. For example, where your shoes press and rub.  Irritation.  Inflammation.  Pain.  Limited motion in your toes. DIAGNOSIS  Hammer toes are diagnosed through a physical exam of your toes. During the exam, your health care provider may try to reproduce your symptoms by manipulating your foot. Often, X-ray exams are done to determine the degree of deformity and to make sure that the cause is not a fracture.  TREATMENT  Hammer toes can be  treated with corrective surgery. There are several types of surgical procedures that can treat hammer toes. The most common procedures include:  Arthroplasty--A portion of the joint is surgically removed and your toe is straightened. The gap fills in with fibrous tissue. This procedure helps treat pain and deformity and helps restore function.  Fusion--Cartilage between the two bones of the affected joint is taken out and the bones fuse together into one longer bone. This helps keep your toe stable and reduces pain but leaves your toe stiff, yet straight.  Implantation--A portion of your bone is removed and replaced with an implant to restore motion.  Flexor tendon transfers--This procedure repositions the tendons that curl the toes down (flexor tendons). This may be done to release the deforming force that causes your toe to buckle. Several of these procedures require fixing your toe with a pin that is visible at the tip of your toe. The pin keeps the toe straight during healing. Your health care provider will remove the pin usually within 4-8 weeks after the procedure.    This information is not intended to replace advice given to you by your health care provider. Make sure you discuss any questions you have with your health care provider.   Document Released: 10/23/2000 Document Revised: 10/31/2013 Document Reviewed: 07/03/2013 Elsevier Interactive Patient Education [employer].

## 2016-02-13 ENCOUNTER — Other Ambulatory Visit (HOSPITAL_COMMUNITY): Payer: Self-pay | Admitting: Internal Medicine

## 2016-02-13 DIAGNOSIS — B182 Chronic viral hepatitis C: Secondary | ICD-10-CM

## 2016-03-10 ENCOUNTER — Ambulatory Visit (HOSPITAL_COMMUNITY): Payer: BLUE CROSS/BLUE SHIELD

## 2016-03-17 ENCOUNTER — Ambulatory Visit (HOSPITAL_COMMUNITY)
Admission: RE | Admit: 2016-03-17 | Discharge: 2016-03-17 | Disposition: A | Discharge status: Home / Self Care | Payer: BLUE CROSS/BLUE SHIELD | Source: Ambulatory Visit | Attending: Internal Medicine | Admitting: Internal Medicine

## 2016-03-17 DIAGNOSIS — B182 Chronic viral hepatitis C: Secondary | ICD-10-CM | POA: Diagnosis not present

## 2016-03-17 DIAGNOSIS — I700 Atherosclerosis of aorta: Secondary | ICD-10-CM | POA: Diagnosis not present

## 2016-04-15 ENCOUNTER — Other Ambulatory Visit: Payer: Self-pay | Admitting: Women's Health

## 2016-04-28 ENCOUNTER — Ambulatory Visit (INDEPENDENT_AMBULATORY_CARE_PROVIDER_SITE_OTHER): Payer: BLUE CROSS/BLUE SHIELD | Admitting: Allergy and Immunology

## 2016-04-28 ENCOUNTER — Encounter: Payer: Self-pay | Admitting: Allergy and Immunology

## 2016-04-28 VITALS — BP 128/88 | HR 76 | Resp 20

## 2016-04-28 DIAGNOSIS — Z720 Tobacco use: Secondary | ICD-10-CM | POA: Diagnosis not present

## 2016-04-28 DIAGNOSIS — L308 Other specified dermatitis: Secondary | ICD-10-CM | POA: Diagnosis not present

## 2016-04-28 DIAGNOSIS — L509 Urticaria, unspecified: Secondary | ICD-10-CM | POA: Diagnosis not present

## 2016-04-28 DIAGNOSIS — B1920 Unspecified viral hepatitis C without hepatic coma: Secondary | ICD-10-CM | POA: Diagnosis not present

## 2016-04-28 DIAGNOSIS — L989 Disorder of the skin and subcutaneous tissue, unspecified: Secondary | ICD-10-CM

## 2016-04-28 MED ORDER — MOMETASONE FUROATE 0.1 % EX CREA: TOPICAL_CREAM | CUTANEOUS | Status: DC

## 2016-04-28 NOTE — Progress Notes (Signed)
Follow-up Note  Referring Provider: [name], FNP Primary Provider: [name], FNP Date of Office Visit: 04/28/2016  Subjective:   Brittany Duffy (DOB: [birth year]) is a 60 y.o. female who returns to the [facility] [place] on 04/28/2016 in re-evaluation of the following:  HPI: Brittany Duffy returns to this clinic in reevaluation of her urticaria and allergic rhinoconjunctivitis and hepatitis C infection and tobacco smoke use. When I last saw her in this clinic in February 2017 she was using cetirizine twice a day and she feels as though this therapy along with treatment for her hepatitis C which she has been using for the past month has resulted in very good control of her urticaria. She still has a few areas that come up especially in the center of her chest and her right arm and her left leg. These lesions appear to be a little bit different and that they're lasting a little bit longer. They're still intensely itchy.    Medication List           CALCIUM PO  Take 1 tablet by mouth daily.     cetirizine 10 MG tablet  Commonly known as:  ZYRTEC  Take 10 mg by mouth 2 (two) times daily.     estradiol 0.0375 mg/24hr patch  Commonly known as:  CLIMARA - Dosed in mg/24 hr  PLACE 1 PATCH (0.0375 MG TOTAL) ONTO THE SKIN ONCE A WEEK.     fish oil-omega-3 fatty acids 1000 MG capsule  Take 2 g by mouth daily.     gabapentin 300 MG capsule  Commonly known as:  NEURONTIN  Take 1 capsule by mouth.     HARVONI 90-400 MG Tabs  Generic drug:  Ledipasvir-Sofosbuvir  Take 1 tablet by mouth daily.     mometasone 0.1 % cream  Commonly known as:  ELOCON  Use once daily on affected skin as directed.     MULTIVITAMIN PO  Take 1 tablet by mouth daily. Reported on 04/28/2016     NORVASC PO  Take by mouth.     RHINOCORT ALLERGY 32 MCG/ACT nasal spray  Generic drug:  budesonide  Place 1 spray into both nostrils daily.     VITAMIN C PO  Take 1 tablet by mouth  daily.     VITAMIN D PO  Take by mouth.     VITAMIN E PO  Take 1 tablet by mouth daily.     XANAX PO  Take by mouth as needed.        Past Medical History  Diagnosis Date  . Fibroid   . Dysplasia of cervix, low grade (CIN 1)     S/P CRYO  . Vaginal intraepithelial neoplasia III (VAIN III)   . Osteopenia 01/2016     T score -1.6 FRAX 3.9%/0.6%  stable from prior DEXA  . Hypertension   . Bronchitis   . Hepatitis C     Past Surgical History  Procedure Laterality Date  . Diverticulum of the bladder    . Total abdominal hysterectomy  2004    BSO  . Carpal tunnel release    . Co2 lsaer of vagina  07, 11  . Oophorectomy      BSO    No Known Allergies  Review of systems negative except as noted in HPI / PMHx or noted below:  Review of Systems  Constitutional: Negative.   HENT: Negative.   Eyes: Negative.   Respiratory: Negative.  Cardiovascular: Negative.   Gastrointestinal: Negative.   Genitourinary: Negative.   Musculoskeletal: Negative.   Skin: Negative.   Neurological: Negative.   Endo/Heme/Allergies: Negative.   Psychiatric/Behavioral: Negative.      Objective:   Filed Vitals:   04/28/16 1130  BP: 128/88  Pulse: 76  Resp: 20          Physical Exam  Constitutional: She is well-developed, well-nourished, and in no distress.  HENT:  Head: Normocephalic.  Right Ear: Tympanic membrane, external ear and ear canal normal.  Left Ear: Tympanic membrane, external ear and ear canal normal.  Nose: Nose normal. No mucosal edema or rhinorrhea.  Mouth/Throat: Uvula is midline, oropharynx is clear and moist and mucous membranes are normal. No oropharyngeal exudate.  Eyes: Conjunctivae are normal.  Neck: Trachea normal. No tracheal tenderness present. No tracheal deviation present. No thyromegaly present.  Cardiovascular: Normal rate, regular rhythm, S1 normal, S2 normal and normal heart sounds.   No murmur heard. Pulmonary/Chest: Breath sounds normal. No  stridor. No respiratory distress. She has no wheezes. She has no rales.  Musculoskeletal: She exhibits no edema.  Lymphadenopathy:       Head (right side): No tonsillar adenopathy present.       Head (left side): No tonsillar adenopathy present.    She has no cervical adenopathy.  Neurological: She is alert. Gait normal.  Skin: Rash (Hyperpigmented slightly scaly 0.5-2 cm somewhat oval lesions involving right shoulder and Center chest) noted. She is not diaphoretic. No erythema. Nails show no clubbing.  Psychiatric: Mood and affect normal.    Diagnostics: None   Assessment and Plan:   1. Urticaria   2. Inflammatory dermatosis   3. Hepatitis C virus infection, unspecified chronicity   4. Tobacco use     1. Continue cetirizine to 10 mg two times per day  2. Continue Rhinocort one spray each nostril one time per day.   3. Continue treatment for Hepatitis C  4. Can try mometasone 0.1% cream applied to skin lesions one time per day if needed  5. Return to clinic in November 2017  6. Stop smoking.  7. Obtain fall flu vaccine this year  Brittany Duffy appears to be doing relatively well although certainly she still has these skin lesions which appear to be a little bit more inflammatory than I remember on previous exam and will give her a topical steroid to apply one time per day and see how this works regarding these issues. Hopefully with continued use of her harvoni a lot of her immunological hyperreactivity will diminish once for hepatitis C is clear. I once again had a talk with her about the need to find a different hobby regarding her tobacco smoke exposure and she'll continue to use therapy for her allergic rhinitis which appears to be working relatively well. I'll see her back in this clinic in November or earlier if there is a problem.  Allena Katz, MD [place]

## 2016-04-28 NOTE — Patient Instructions (Signed)
  1. Continue cetirizine to 10 mg two times per day  2. Continue Rhinocort one spray each nostril one time per day.   3. Continue treatment for Hepatitis C  4. Can try mometasone 0.1% cream applied to skin lesions one time per day if needed  5. Return to clinic in November 2017  6. Stop smoking.  7. Obtain fall flu vaccine this year

## 2016-07-02 ENCOUNTER — Other Ambulatory Visit: Payer: Self-pay | Admitting: Nurse Practitioner

## 2016-07-02 DIAGNOSIS — K7400 Hepatic fibrosis, unspecified: Secondary | ICD-10-CM

## 2016-07-15 ENCOUNTER — Encounter: Payer: Self-pay | Admitting: Women's Health

## 2016-07-15 ENCOUNTER — Ambulatory Visit (INDEPENDENT_AMBULATORY_CARE_PROVIDER_SITE_OTHER): Payer: BLUE CROSS/BLUE SHIELD | Admitting: Women's Health

## 2016-07-15 VITALS — BP 125/86 | Ht 61.0 in | Wt 148.0 lb

## 2016-07-15 DIAGNOSIS — Z79890 Hormone replacement therapy (postmenopausal): Secondary | ICD-10-CM

## 2016-07-15 DIAGNOSIS — Z01419 Encounter for gynecological examination (general) (routine) without abnormal findings: Secondary | ICD-10-CM

## 2016-07-15 MED ORDER — ESTRADIOL 0.0375 MG/24HR TD PTWK: MEDICATED_PATCH | TRANSDERMAL | 4 refills | Status: DC

## 2016-07-15 NOTE — Patient Instructions (Signed)

## 2016-07-15 NOTE — Progress Notes (Signed)
Brittany Duffy 05-18-1956 [id number]    History:    Presents for annual exam.  2004 TAH with BSO for fibroids on Climara patch, cutting them in half which has been managing symptoms. 2007 VAIN 3 with CO2 laser normal Paps after. 2007 negative colonoscopy. 2017 T score -1.6 at spine and -1.6 at right femoral neck. Primary care manages hypertension and labs. Had shingles in 2015 has not received Zostavax. Mother history of breast cancer at age 62 survivor, diabetes and hypertension. Was treated for hepatitis C this past year with Harvoni. Smoker less than a pack per day. Same partner years /denies need for STD screening.  Past medical history, past surgical history, family history and social history were all reviewed and documented in the EPIC chart. Has one son. Reports stressful year with numerous family demands.  ROS:  A ROS was performed and pertinent positives and negatives are included.  Exam:  Vitals:   07/15/16 1150  BP: 125/86  Weight: 148 lb (67.1 kg)  Height: 5\' 1"  (1.549 m)   Body mass index is 27.96 kg/m.   General appearance:  Normal Thyroid:  Symmetrical, normal in size, without palpable masses or nodularity. Respiratory  Auscultation:  Clear without wheezing or rhonchi Cardiovascular  Auscultation:  Regular rate, without rubs, murmurs or gallops  Edema/varicosities:  Not grossly evident Abdominal  Soft,nontender, without masses, guarding or rebound.  Liver/spleen:  No organomegaly noted  Hernia:  None appreciated  Skin  Inspection:  Grossly normal   Breasts: Examined lying and sitting.     Right: Without masses, retractions, discharge or axillary adenopathy.     Left: Without masses, retractions, discharge or axillary adenopathy. Gentitourinary   Inguinal/mons:  Normal without inguinal adenopathy  External genitalia:  Normal  BUS/Urethra/Skene's glands:  Normal  Vagina:  Mild atrophy  Cervix:  And uterus absent  Adnexa/parametria:     Rt: Without  masses or tenderness.   Lt: Without masses or tenderness.  Anus and perineum: Normal  Digital rectal exam: Normal sphincter tone without palpated masses or tenderness  Assessment/Plan:  60 y.o. SBF G1 P1 for annual exam with no GYN complaints.  2004 TAH with BSO for fibroids on ERT 2007 CIN-3 CO2 laser normal Paps after Osteopenia without elevated FRAX Hypertension-primary care manages labs and meds 2016 treated for hepatitis C Smoker less than one pack per day  Plan: Reviewed importance of decreasing, stopping smoking for bone and heart health. Home safety, fall prevention and importance of weightbearing exercise daily reviewed. SBE's, continue annual screening mammogram. Instructed to have vitamin D level checked with next lab draw at annual exam. Zostavax recommended. Pap normal with negative HR HPV 2016 will repeat next year.    Huel Cote [facility], 12:43 PM 07/15/2016

## 2016-09-09 ENCOUNTER — Ambulatory Visit
Admission: RE | Admit: 2016-09-09 | Discharge: 2016-09-09 | Disposition: A | Discharge status: Home / Self Care | Payer: BLUE CROSS/BLUE SHIELD | Source: Ambulatory Visit | Attending: Nurse Practitioner | Admitting: Nurse Practitioner

## 2016-09-09 DIAGNOSIS — K7400 Hepatic fibrosis, unspecified: Secondary | ICD-10-CM

## 2016-09-15 ENCOUNTER — Encounter: Payer: Self-pay | Admitting: Allergy and Immunology

## 2016-09-15 ENCOUNTER — Encounter (INDEPENDENT_AMBULATORY_CARE_PROVIDER_SITE_OTHER): Payer: Self-pay

## 2016-09-15 ENCOUNTER — Ambulatory Visit (INDEPENDENT_AMBULATORY_CARE_PROVIDER_SITE_OTHER): Payer: BLUE CROSS/BLUE SHIELD | Admitting: Allergy and Immunology

## 2016-09-15 VITALS — BP 122/82 | HR 88 | Resp 24

## 2016-09-15 DIAGNOSIS — H1010 Acute atopic conjunctivitis, unspecified eye: Secondary | ICD-10-CM

## 2016-09-15 DIAGNOSIS — B1920 Unspecified viral hepatitis C without hepatic coma: Secondary | ICD-10-CM

## 2016-09-15 DIAGNOSIS — Z720 Tobacco use: Secondary | ICD-10-CM

## 2016-09-15 DIAGNOSIS — L509 Urticaria, unspecified: Secondary | ICD-10-CM | POA: Diagnosis not present

## 2016-09-15 DIAGNOSIS — L308 Other specified dermatitis: Secondary | ICD-10-CM

## 2016-09-15 DIAGNOSIS — F22 Delusional disorders: Secondary | ICD-10-CM

## 2016-09-15 DIAGNOSIS — L989 Disorder of the skin and subcutaneous tissue, unspecified: Secondary | ICD-10-CM

## 2016-09-15 DIAGNOSIS — J309 Allergic rhinitis, unspecified: Secondary | ICD-10-CM

## 2016-09-15 NOTE — Patient Instructions (Signed)
  1. Continue cetirizine 10 mg 1-2 times per day  2. Continue Rhinocort one spray each nostril one time per day.   3. Can try mometasone 0.1% cream applied to skin lesions one time per day if needed  4. Return to clinic in 6 months or earlier if problem  5. Stop smoking. Use nicotine replacements  

## 2016-09-15 NOTE — Progress Notes (Signed)
Follow-up Note  Referring Provider: [name], FNP Primary Provider: [name], FNP Date of Office Visit: 09/15/2016  Subjective:   Brittany Duffy (DOB: [birth year]) is a 60 y.o. female who returns to the [facility] [place] on 09/15/2016 in re-evaluation of the following:  HPI: Brittany Duffy returns to this clinic in reevaluation of her urticaria and inflammatory dermatosis and allergic rhinoconjunctivitis and hepatitis C. I last saw her in his clinic in June 2017.  She is much better regarding her skin. She has not had any episodes of urticaria and no rashes requiring her to use a topical steroid. However, she still feels as though she is infected with house dust mite and she is still somewhat concerned about this issue regarding how contagious she may be.  Brittany Duffy has apparently had successful therapy for her hepatitis C and it is really this therapy that has given rise to a diminution in her skin problem. Ever since she started therapy for hepatitis C her skin issue has basically resolved. Apparently she has had 2 blood tests identifying no hepatitis C RNA after therapy.  Her nose is doing very well while consistently using her Rhinocort. She has not required an antibiotic to treat an episode of sinusitis since I've seen her in this clinic.  She is interested in stopping smoking. She did speak with her family doctor who gave her Wellbutrin which she has not started yet.  She did receive the flu vaccine this year.    Medication List      ALPRAZolam 0.5 MG tablet Commonly known as:  XANAX   amLODipine 10 MG tablet Commonly known as:  NORVASC   buPROPion 150 MG 12 hr tablet Commonly known as:  WELLBUTRIN SR   CALCIUM PO Take 1 tablet by mouth daily.   cetirizine 10 MG tablet Commonly known as:  ZYRTEC Take 10 mg by mouth 2 (two) times daily.   Doxepin HCl 5 % Crea   estradiol 0.0375 mg/24hr patch Commonly known as:  CLIMARA - Dosed in mg/24  hr PLACE 1 PATCH (0.0375 MG TOTAL) ONTO THE SKIN ONCE A WEEK.   fish oil-omega-3 fatty acids 1000 MG capsule Take 2 g by mouth daily.   gabapentin 300 MG capsule Commonly known as:  NEURONTIN Take 1 capsule by mouth.   GARLIC PO Take by mouth.   levocetirizine 5 MG tablet Commonly known as:  XYZAL   lidocaine 5 % ointment Commonly known as:  XYLOCAINE   MILK THISTLE PO Take by mouth.   mometasone 0.1 % cream Commonly known as:  ELOCON Use once daily on affected skin as directed.   montelukast 10 MG tablet Commonly known as:  SINGULAIR   MULTIVITAMIN PO Take 1 tablet by mouth daily. Reported on 04/28/2016   RHINOCORT ALLERGY 32 MCG/ACT nasal spray Generic drug:  budesonide Place 1 spray into both nostrils daily.   VITAMIN C PO Take 1 tablet by mouth daily.   VITAMIN D PO Take by mouth.   VITAMIN E PO Take 1 tablet by mouth daily.       Past Medical History:  Diagnosis Date  . Bronchitis   . Dysplasia of cervix, low grade (CIN 1)    S/P CRYO  . Fibroid   . Hepatitis C   . Hypertension   . Osteopenia 01/2016    T score -1.6 FRAX 3.9%/0.6%  stable from prior DEXA  . Vaginal intraepithelial neoplasia III (VAIN III)     Past  Surgical History:  Procedure Laterality Date  . CARPAL TUNNEL RELEASE    . CO2 LSAER OF VAGINA  07, 11  . DIVERTICULUM OF THE BLADDER    . OOPHORECTOMY     BSO  . TOTAL ABDOMINAL HYSTERECTOMY  2004   BSO    No Known Allergies  Review of systems negative except as noted in HPI / PMHx or noted below:  Review of Systems  Constitutional: Negative.   HENT: Negative.   Eyes: Negative.   Respiratory: Negative.   Cardiovascular: Negative.   Gastrointestinal: Negative.   Genitourinary: Negative.   Musculoskeletal: Negative.   Skin: Negative.   Neurological: Negative.   Endo/Heme/Allergies: Negative.   Psychiatric/Behavioral: Negative.      Objective:   Vitals:   09/15/16 1121  BP: 122/82  Pulse: 88  Resp: (!) 24           Physical Exam  Constitutional: She is well-developed, well-nourished, and in no distress.  HENT:  Head: Normocephalic.  Right Ear: Tympanic membrane, external ear and ear canal normal.  Left Ear: Tympanic membrane, external ear and ear canal normal.  Nose: Nose normal. No mucosal edema or rhinorrhea.  Mouth/Throat: Uvula is midline, oropharynx is clear and moist and mucous membranes are normal. No oropharyngeal exudate.  Eyes: Conjunctivae are normal.  Neck: Trachea normal. No tracheal tenderness present. No tracheal deviation present. No thyromegaly present.  Cardiovascular: Normal rate, regular rhythm, S1 normal, S2 normal and normal heart sounds.   No murmur heard. Pulmonary/Chest: Breath sounds normal. No stridor. No respiratory distress. She has no wheezes. She has no rales.  Musculoskeletal: She exhibits no edema.  Lymphadenopathy:       Head (right side): No tonsillar adenopathy present.       Head (left side): No tonsillar adenopathy present.    She has no cervical adenopathy.  Neurological: She is alert. Gait normal.  Skin: No rash noted. She is not diaphoretic. No erythema. Nails show no clubbing.  Psychiatric: Mood and affect normal.    Diagnostics: none   Assessment and Plan:   1. Urticaria   2. Inflammatory dermatosis   3. Allergic rhinoconjunctivitis   4. Hepatitis C virus infection, unspecified chronicity   5. 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 Continue cetirizine 10 mg 1-2 times per day  2. Continue Rhinocort one spray each nostril one time per day.   3. Can try mometasone 0.1% cream applied to skin lesions one time per day if needed  4. Return to clinic in 6 months or earlier if problem  5. Stop smoking. Use nicotine replacements  Eulas Post is doing much better and she is certainly welcome to taper down her cetirizine as I suspect that most of her immunological hyperreactivity that was giving rise to her urticaria and to her  inflammatory dermatosis has basically abated since she was treated successfully for her hepatitis C. She does believe that she still infected with house dust mite and I had a long discussion with her today but I was not very successful in changing her beliefs regarding this issue. I did spend a fair amount of time as well talking about her tobacco use and recommended that she use nicotine supplements in conjunction with the Wellbutrin that was prescribed by her medical provider. Her upper airway disease appears to be under excellent control while using some Rhinocort and we'll continue to have her use this medication at this point. I will see her back  in this clinic in 6 months or earlier if there is a problem.  Allena Katz, MD [facility]

## 2016-12-24 ENCOUNTER — Other Ambulatory Visit: Payer: Self-pay | Admitting: Family

## 2016-12-24 DIAGNOSIS — Z1231 Encounter for screening mammogram for malignant neoplasm of breast: Secondary | ICD-10-CM

## 2017-01-25 ENCOUNTER — Ambulatory Visit
Admission: RE | Admit: 2017-01-25 | Discharge: 2017-01-25 | Disposition: A | Discharge status: Home / Self Care | Payer: BLUE CROSS/BLUE SHIELD | Source: Ambulatory Visit | Attending: Family | Admitting: Family

## 2017-01-25 DIAGNOSIS — Z1231 Encounter for screening mammogram for malignant neoplasm of breast: Secondary | ICD-10-CM

## 2017-03-16 ENCOUNTER — Encounter: Payer: Self-pay | Admitting: Allergy and Immunology

## 2017-03-16 ENCOUNTER — Ambulatory Visit (INDEPENDENT_AMBULATORY_CARE_PROVIDER_SITE_OTHER): Payer: BLUE CROSS/BLUE SHIELD | Admitting: Allergy and Immunology

## 2017-03-16 VITALS — BP 128/80 | HR 74 | Resp 16

## 2017-03-16 DIAGNOSIS — Z720 Tobacco use: Secondary | ICD-10-CM

## 2017-03-16 DIAGNOSIS — L989 Disorder of the skin and subcutaneous tissue, unspecified: Secondary | ICD-10-CM

## 2017-03-16 DIAGNOSIS — L308 Other specified dermatitis: Secondary | ICD-10-CM | POA: Diagnosis not present

## 2017-03-16 DIAGNOSIS — L509 Urticaria, unspecified: Secondary | ICD-10-CM

## 2017-03-16 DIAGNOSIS — J3089 Other allergic rhinitis: Secondary | ICD-10-CM | POA: Diagnosis not present

## 2017-03-16 MED ORDER — MOMETASONE FUROATE 0.1 % EX CREA: TOPICAL_CREAM | CUTANEOUS | 2 refills | Status: DC

## 2017-03-16 NOTE — Patient Instructions (Signed)
  1. Continue cetirizine 10 mg 1-2 times per day  2. Continue Rhinocort one spray each nostril one time per day.   3. Can try mometasone 0.1% cream applied to skin lesions one time per day if needed  4. Return to clinic in 6 months or earlier if problem  5. Stop smoking. Use nicotine replacements

## 2017-03-16 NOTE — Progress Notes (Signed)
Follow-up Note  Referring Provider: [name], FNP Primary Provider: [name], FNP Date of Office Visit: 03/16/2017  Subjective:   Brittany Duffy (DOB: 08-03-1956) is a 61 y.o. female who returns to the [facility] [place] on 03/16/2017 in re-evaluation of the following:  HPI: Brittany Duffy returns to this clinic in reevaluation of her urticaria and history of inflammatory dermatosis and history of allergic rhinoconjunctivitis. I have not seen her in this clinic since November 2017.  Ever since she was treated for hepatitis C almost all of her skin condition appears to have resolved. She only uses cetirizine one time per day and sometimes misses a dose here and there. She has not had any new inflammatory lesions and she has not had to use any topical mometasone cream in quite a long period in time.  However, she still thinks that she is infected with dust mite. She is reading a book about this issue at this point in time. She is going to use a fair amount of supplements in an attempt to treat this issue.  Her nose is really doing quite well and she's not had any significant problems requiring her to receive a systemic steroid or an antibiotic to treat his respiratory issue.  She still continues to smoke about 1 pack per day mostly because of stress.  Allergies as of 03/16/2017   No Known Allergies     Medication List      ALPRAZolam 0.5 MG tablet Commonly known as:  XANAX Take 0.5 mg by mouth.   amLODipine 10 MG tablet Commonly known as:  NORVASC   atorvastatin 10 MG tablet Commonly known as:  LIPITOR Take 10 mg by mouth daily.   buPROPion 150 MG 12 hr tablet Commonly known as:  WELLBUTRIN SR   CALCIUM PO Take 1 tablet by mouth daily.   cetirizine 10 MG tablet Commonly known as:  ZYRTEC Take 10 mg by mouth 2 (two) times daily.   Doxepin HCl 5 % Crea   estradiol 0.0375 mg/24hr patch Commonly known as:  CLIMARA - Dosed in mg/24 hr PLACE  1 PATCH (0.0375 MG TOTAL) ONTO THE SKIN ONCE A WEEK.   fish oil-omega-3 fatty acids 1000 MG capsule Take 2 g by mouth daily.   gabapentin 300 MG capsule Commonly known as:  NEURONTIN Take 1 capsule by mouth.   GARLIC PO Take by mouth.   levocetirizine 5 MG tablet Commonly known as:  XYZAL   lidocaine 5 % ointment Commonly known as:  XYLOCAINE   MILK THISTLE PO Take by mouth.   mometasone 0.1 % cream Commonly known as:  ELOCON Use once daily on affected skin as directed.   montelukast 10 MG tablet Commonly known as:  SINGULAIR   MULTIVITAMIN PO Take 1 tablet by mouth daily. Reported on 04/28/2016   NUTRITIONAL SUPPLEMENT PO Take by mouth. Oceans Alive: Raw Phytoplankton   RHINOCORT ALLERGY 32 MCG/ACT nasal spray Generic drug:  budesonide Place 1 spray into both nostrils daily.   VITAMIN A PO Take by mouth.   VITAMIN B 12 PO Take by mouth.   VITAMIN C PO Take 1 tablet by mouth daily.   VITAMIN D PO Take by mouth.   VITAMIN E PO Take 1 tablet by mouth daily.       Past Medical History:  Diagnosis Date  . Bronchitis   . Dysplasia of cervix, low grade (CIN 1)    S/P CRYO  . Fibroid   .  Hepatitis C   . Hypertension   . Osteopenia 01/2016    T score -1.6 FRAX 3.9%/0.6%  stable from prior DEXA  . Vaginal intraepithelial neoplasia III (VAIN III)     Past Surgical History:  Procedure Laterality Date  . CARPAL TUNNEL RELEASE    . CO2 LSAER OF VAGINA  07, 11  . DIVERTICULUM OF THE BLADDER    . OOPHORECTOMY     BSO  . TOTAL ABDOMINAL HYSTERECTOMY  2004   BSO    Review of systems negative except as noted in HPI / PMHx or noted below:  Review of Systems  Constitutional: Negative.   HENT: Negative.   Eyes: Negative.   Respiratory: Negative.   Cardiovascular: Negative.   Gastrointestinal: Negative.   Genitourinary: Negative.   Musculoskeletal: Negative.   Skin: Negative.   Neurological: Negative.   Endo/Heme/Allergies: Negative.     Psychiatric/Behavioral: Negative.      Objective:   Vitals:   03/16/17 1150  BP: 128/80  Pulse: 74  Resp: 16          Physical Exam  Constitutional: She is well-developed, well-nourished, and in no distress.  HENT:  Head: Normocephalic.  Right Ear: Tympanic membrane, external ear and ear canal normal.  Left Ear: Tympanic membrane, external ear and ear canal normal.  Nose: Nose normal. No mucosal edema or rhinorrhea.  Mouth/Throat: Uvula is midline, oropharynx is clear and moist and mucous membranes are normal. No oropharyngeal exudate.  Eyes: Conjunctivae are normal.  Neck: Trachea normal. No tracheal tenderness present. No tracheal deviation present. No thyromegaly present.  Cardiovascular: Normal rate, regular rhythm, S1 normal, S2 normal and normal heart sounds.   No murmur heard. Pulmonary/Chest: Breath sounds normal. No stridor. No respiratory distress. She has no wheezes. She has no rales.  Musculoskeletal: She exhibits no edema.  Lymphadenopathy:       Head (right side): No tonsillar adenopathy present.       Head (left side): No tonsillar adenopathy present.    She has no cervical adenopathy.  Neurological: She is alert. Gait normal.  Skin: No rash noted. She is not diaphoretic. No erythema. Nails show no clubbing.  Psychiatric: Mood and affect normal.    Diagnostics: none  Assessment and Plan:   1. Urticaria   2. Inflammatory dermatosis   3. Other allergic rhinitis   4. Tobacco use     1. Continue cetirizine 10 mg 1-2 times per day  2. Continue Rhinocort one spray each nostril one time per day.   3. Can try mometasone 0.1% cream applied to skin lesions one time per day if needed  4. Return to clinic in 6 months or earlier if problem  5. Stop smoking. Use nicotine replacements  Brittany Duffy appears to be doing relatively well at this point in time and she will continue to use the medical therapy specified above and I will see her back in this clinic in 6  months or earlier if there is a problem. I did mention to her that if she spent as much time and energy on her tobacco cessation plan as she does researching her delusions of parasitosis she would really do much better in the long run.  Allena Katz, MD [facility] [place]

## 2017-03-24 ENCOUNTER — Encounter: Payer: Self-pay | Admitting: Gynecology

## 2017-07-27 ENCOUNTER — Ambulatory Visit (INDEPENDENT_AMBULATORY_CARE_PROVIDER_SITE_OTHER): Payer: BLUE CROSS/BLUE SHIELD | Admitting: Women's Health

## 2017-07-27 ENCOUNTER — Encounter: Payer: Self-pay | Admitting: Women's Health

## 2017-07-27 VITALS — BP 152/80 | Ht 61.0 in | Wt 139.0 lb

## 2017-07-27 DIAGNOSIS — B3731 Acute candidiasis of vulva and vagina: Secondary | ICD-10-CM

## 2017-07-27 DIAGNOSIS — Z01419 Encounter for gynecological examination (general) (routine) without abnormal findings: Secondary | ICD-10-CM

## 2017-07-27 DIAGNOSIS — N898 Other specified noninflammatory disorders of vagina: Secondary | ICD-10-CM

## 2017-07-27 DIAGNOSIS — B373 Candidiasis of vulva and vagina: Secondary | ICD-10-CM

## 2017-07-27 DIAGNOSIS — Z79890 Hormone replacement therapy (postmenopausal): Secondary | ICD-10-CM | POA: Diagnosis not present

## 2017-07-27 LAB — WET PREP FOR TRICH, YEAST, CLUE

## 2017-07-27 MED ORDER — ESTRADIOL 0.0375 MG/24HR TD PTWK: MEDICATED_PATCH | TRANSDERMAL | 4 refills | Status: DC

## 2017-07-27 MED ORDER — TERCONAZOLE 0.4 % VA CREA: 1.0000 | TOPICAL_CREAM | Freq: Every day | VAGINAL | 0 refills | Status: DC

## 2017-07-27 NOTE — Progress Notes (Signed)
Brittany Duffy 1956/09/19 [id number]    History:    Presents for annual exam.  TAH with BSO for fibroids on Climara patch 0.375 weekly. Treated for hep C this past year has follow-up scheduled. Hypertension managed by primary care. 2007 VAIN 3 normal Paps after. Continues to smoke less than a pack per day. Mother breast cancer age 63. Osteopenia T score -1.6 at spine, -1.6 left femoral neck FRAX 3.9%/0.6%. Negative colonoscopy history.  Past medical history, past surgical history, family history and social history were all reviewed and documented in the EPIC chart. Mother diabetes and hypertension. Helping to care for numerous family members. Has one son.  ROS:  A ROS was performed and pertinent positives and negatives are included.  Exam:  Vitals:   07/27/17 1627  BP: (!) 152/80  Weight: 139 lb (63 kg)  Height: 5\' 1"  (1.549 m)   Body mass index is 26.26 kg/m.   General appearance:  Normal Thyroid:  Symmetrical, normal in size, without palpable masses or nodularity. Respiratory  Auscultation:  Clear without wheezing or rhonchi Cardiovascular  Auscultation:  Regular rate, without rubs, murmurs or gallops  Edema/varicosities:  Not grossly evident Abdominal  Soft,nontender, without masses, guarding or rebound.  Liver/spleen:  No organomegaly noted  Hernia:  None appreciated  Skin  Inspection:  Grossly normal   Breasts: Examined lying and sitting.     Right: Without masses, retractions, discharge or axillary adenopathy.     Left: Without masses, retractions, discharge or axillary adenopathy. Gentitourinary   Inguinal/mons:  Normal without inguinal adenopathy  External genitalia:  Erythematous at introitus  BUS/Urethra/Skene's glands:  Normal  Vagina:  Wet prep negative, mild atrophy  Cervix:  Uterus absent  Adnexa/parametria:     Rt: Without masses or tenderness.   Lt: Without masses or tenderness.  Anus and perineum: Normal  Digital rectal exam: Normal sphincter  tone without palpated masses or tenderness  Assessment/Plan:  61 y.o. SBF G2 P1 for annual exam with complaint of vaginal irritation.  TAH with BSO on Climara patch Hypertension primary care manages labs and meds 2007 VAIN-3 Osteopenia without elevated FRAX Smoker.  Plan: HRT reviewed risks of blood clots, strokes and breast cancer, continues to have hot flashes when off, will continue Climara  patch 0.0375 weekly. SBE's, continue annual screening mammogram, calcium rich diet, MVI daily vitamin D 2000 daily encouraged. Home safety, fall prevention and importance of weightbearing exercise reviewed. Reviewed Pap slightly elevated today. Pap, screening guidelines reviewed. Aware of hazards of smoking, reviewed tips to quit. Vaginal irritation with a negative wet prep will try  Terazol 7 externally will call if continued problems.    Huel Cote [facility], 5:12 PM 07/27/2017

## 2017-07-27 NOTE — Patient Instructions (Addendum)
Steps to Quit Smoking Smoking tobacco can be bad for your health. It can also affect almost every organ in your body. Smoking puts you and people around you at risk for many serious long-lasting (chronic) diseases. Quitting smoking is hard, but it is one of the best things that you can do for your health. It is never too late to quit. What are the benefits of quitting smoking? When you quit smoking, you lower your risk for getting serious diseases and conditions. They can include:  Lung cancer or lung disease.  Heart disease.  Stroke.  Heart attack.  Not being able to have children (infertility).  Weak bones (osteoporosis) and broken bones (fractures).  If you have coughing, wheezing, and shortness of breath, those symptoms may get better when you quit. You may also get sick less often. If you are pregnant, quitting smoking can help to lower your chances of having a baby of low birth weight. What can I do to help me quit smoking? Talk with your doctor about what can help you quit smoking. Some things you can do (strategies) include:  Quitting smoking totally, instead of slowly cutting back how much you smoke over a period of time.  Going to in-person counseling. You are more likely to quit if you go to many counseling sessions.  Using resources and support systems, such as: ? Online chats with a counselor. ? Phone quitlines. ? Printed self-help materials. ? Support groups or group counseling. ? Text messaging programs. ? Mobile phone apps or applications.  Taking medicines. Some of these medicines may have nicotine in them. If you are pregnant or breastfeeding, do not take any medicines to quit smoking unless your doctor says it is okay. Talk with your doctor about counseling or other things that can help you.  Talk with your doctor about using more than one strategy at the same time, such as taking medicines while you are also going to in-person counseling. This can help make  quitting easier. What things can I do to make it easier to quit? Quitting smoking might feel very hard at first, but there is a lot that you can do to make it easier. Take these steps:  Talk to your family and friends. Ask them to support and encourage you.  Call phone quitlines, reach out to support groups, or work with a counselor.  Ask people who smoke to not smoke around you.  Avoid places that make you want (trigger) to smoke, such as: ? Bars. ? Parties. ? Smoke-break areas at work.  Spend time with people who do not smoke.  Lower the stress in your life. Stress can make you want to smoke. Try these things to help your stress: ? Getting regular exercise. ? Deep-breathing exercises. ? Yoga. ? Meditating. ? Doing a body scan. To do this, close your eyes, focus on one area of your body at a time from head to toe, and notice which parts of your body are tense. Try to relax the muscles in those areas.  Download or buy apps on your mobile phone or tablet that can help you stick to your quit plan. There are many free apps, such as QuitGuide from the CDC (Centers for Disease Control and Prevention). You can find more support from smokefree.gov and other websites.  This information is not intended to replace advice given to you by your health care provider. Make sure you discuss any questions you have with your health care provider. Document Released: 08/22/2009 Document   Revised: 06/23/2016 Document Reviewed: 03/12/2015 Elsevier Interactive Patient Education  2018 [place] Maintenance for Postmenopausal Women Menopause is a normal process in which your reproductive ability comes to an end. This process happens gradually over a span of months to years, usually between the ages of 28 and 66. Menopause is complete when you have missed 12 consecutive menstrual periods. It is important to talk with your health care provider about some of the most common conditions that affect  postmenopausal women, such as heart disease, cancer, and bone loss (osteoporosis). Adopting a healthy lifestyle and getting preventive care can help to promote your health and wellness. Those actions can also lower your chances of developing some of these common conditions. What should I know about menopause? During menopause, you may experience a number of symptoms, such as:  Moderate-to-severe hot flashes.  Night sweats.  Decrease in sex drive.  Mood swings.  Headaches.  Tiredness.  Irritability.  Memory problems.  Insomnia.  Choosing to treat or not to treat menopausal changes is an individual decision that you make with your health care provider. What should I know about hormone replacement therapy and supplements? Hormone therapy products are effective for treating symptoms that are associated with menopause, such as hot flashes and night sweats. Hormone replacement carries certain risks, especially as you become older. If you are thinking about using estrogen or estrogen with progestin treatments, discuss the benefits and risks with your health care provider. What should I know about heart disease and stroke? Heart disease, heart attack, and stroke become more likely as you age. This may be due, in part, to the hormonal changes that your body experiences during menopause. These can affect how your body processes dietary fats, triglycerides, and cholesterol. Heart attack and stroke are both medical emergencies. There are many things that you can do to help prevent heart disease and stroke:  Have your blood pressure checked at least every 1-2 years. High blood pressure causes heart disease and increases the risk of stroke.  If you are 70-63 years old, ask your health care provider if you should take aspirin to prevent a heart attack or a stroke.  Do not use any tobacco products, including cigarettes, chewing tobacco, or electronic cigarettes. If you need help quitting, ask your  health care provider.  It is important to eat a healthy diet and maintain a healthy weight. ? Be sure to include plenty of vegetables, fruits, low-fat dairy products, and lean protein. ? Avoid eating foods that are high in solid fats, added sugars, or salt (sodium).  Get regular exercise. This is one of the most important things that you can do for your health. ? Try to exercise for at least 150 minutes each week. The type of exercise that you do should increase your heart rate and make you sweat. This is known as moderate-intensity exercise. ? Try to do strengthening exercises at least twice each week. Do these in addition to the moderate-intensity exercise.  Know your numbers.Ask your health care provider to check your cholesterol and your blood glucose. Continue to have your blood tested as directed by your health care provider.  What should I know about cancer screening? There are several types of cancer. Take the following steps to reduce your risk and to catch any cancer development as early as possible. Breast Cancer  Practice breast self-awareness. ? This means understanding how your breasts normally appear and feel. ? It also means doing regular breast self-exams. Let your health care  provider know about any changes, no matter how small.  If you are 79 or older, have a clinician do a breast exam (clinical breast exam or CBE) every year. Depending on your age, family history, and medical history, it may be recommended that you also have a yearly breast X-ray (mammogram).  If you have a family history of breast cancer, talk with your health care provider about genetic screening.  If you are at high risk for breast cancer, talk with your health care provider about having an MRI and a mammogram every year.  Breast cancer (BRCA) gene test is recommended for women who have family members with BRCA-related cancers. Results of the assessment will determine the need for genetic counseling  and BRCA1 and for BRCA2 testing. BRCA-related cancers include these types: ? Breast. This occurs in males or females. ? Ovarian. ? Tubal. This may also be called fallopian tube cancer. ? Cancer of the abdominal or pelvic lining (peritoneal cancer). ? Prostate. ? Pancreatic.  Cervical, Uterine, and Ovarian Cancer Your health care provider may recommend that you be screened regularly for cancer of the pelvic organs. These include your ovaries, uterus, and vagina. This screening involves a pelvic exam, which includes checking for microscopic changes to the surface of your cervix (Pap test).  For women ages 21-65, health care providers may recommend a pelvic exam and a Pap test every three years. For women ages 40-65, they may recommend the Pap test and pelvic exam, combined with testing for human papilloma virus (HPV), every five years. Some types of HPV increase your risk of cervical cancer. Testing for HPV may also be done on women of any age who have unclear Pap test results.  Other health care providers may not recommend any screening for nonpregnant women who are considered low risk for pelvic cancer and have no symptoms. Ask your health care provider if a screening pelvic exam is right for you.  If you have had past treatment for cervical cancer or a condition that could lead to cancer, you need Pap tests and screening for cancer for at least 20 years after your treatment. If Pap tests have been discontinued for you, your risk factors (such as having a new sexual partner) need to be reassessed to determine if you should start having screenings again. Some women have medical problems that increase the chance of getting cervical cancer. In these cases, your health care provider may recommend that you have screening and Pap tests more often.  If you have a family history of uterine cancer or ovarian cancer, talk with your health care provider about genetic screening.  If you have vaginal bleeding  after reaching menopause, tell your health care provider.  There are currently no reliable tests available to screen for ovarian cancer.  Lung Cancer Lung cancer screening is recommended for adults 31-83 years old who are at high risk for lung cancer because of a history of smoking. A yearly low-dose CT scan of the lungs is recommended if you:  Currently smoke.  Have a history of at least 30 pack-years of smoking and you currently smoke or have quit within the past 15 years. A pack-year is smoking an average of one pack of cigarettes per day for one year.  Yearly screening should:  Continue until it has been 15 years since you quit.  Stop if you develop a health problem that would prevent you from having lung cancer treatment.  Colorectal Cancer  This type of cancer can be  detected and can often be prevented.  Routine colorectal cancer screening usually begins at age 73 and continues through age 54.  If you have risk factors for colon cancer, your health care provider may recommend that you be screened at an earlier age.  If you have a family history of colorectal cancer, talk with your health care provider about genetic screening.  Your health care provider may also recommend using home test kits to check for hidden blood in your stool.  A small camera at the end of a tube can be used to examine your colon directly (sigmoidoscopy or colonoscopy). This is done to check for the earliest forms of colorectal cancer.  Direct examination of the colon should be repeated every 5-10 years until age 87. However, if early forms of precancerous polyps or small growths are found or if you have a family history or genetic risk for colorectal cancer, you may need to be screened more often.  Skin Cancer  Check your skin from head to toe regularly.  Monitor any moles. Be sure to tell your health care provider: ? About any new moles or changes in moles, especially if there is a change in a  mole's shape or color. ? If you have a mole that is larger than the size of a pencil eraser.  If any of your family members has a history of skin cancer, especially at a young age, talk with your health care provider about genetic screening.  Always use sunscreen. Apply sunscreen liberally and repeatedly throughout the day.  Whenever you are outside, protect yourself by wearing long sleeves, pants, a wide-brimmed hat, and sunglasses.  What should I know about osteoporosis? Osteoporosis is a condition in which bone destruction happens more quickly than new bone creation. After menopause, you may be at an increased risk for osteoporosis. To help prevent osteoporosis or the bone fractures that can happen because of osteoporosis, the following is recommended:  If you are 53-50 years old, get at least 1,000 mg of calcium and at least 600 mg of vitamin D per day.  If you are older than age 13 but younger than age 32, get at least 1,200 mg of calcium and at least 600 mg of vitamin D per day.  If you are older than age 44, get at least 1,200 mg of calcium and at least 800 mg of vitamin D per day.  Smoking and excessive alcohol intake increase the risk of osteoporosis. Eat foods that are rich in calcium and vitamin D, and do weight-bearing exercises several times each week as directed by your health care provider. What should I know about how menopause affects my mental health? Depression may occur at any age, but it is more common as you become older. Common symptoms of depression include:  Low or sad mood.  Changes in sleep patterns.  Changes in appetite or eating patterns.  Feeling an overall lack of motivation or enjoyment of activities that you previously enjoyed.  Frequent crying spells.  Talk with your health care provider if you think that you are experiencing depression. What should I know about immunizations? It is important that you get and maintain your immunizations. These  include:  Tetanus, diphtheria, and pertussis (Tdap) booster vaccine.  Influenza every year before the flu season begins.  Pneumonia vaccine.  Shingles vaccine.  Your health care provider may also recommend other immunizations. This information is not intended to replace advice given to you by your health care provider. Make sure  you discuss any questions you have with your health care provider. Document Released: 12/18/2005 Document Revised: 05/15/2016 Document Reviewed: 07/30/2015 Elsevier Interactive Patient Education  2018 [employer].

## 2017-07-28 NOTE — Addendum Note (Signed)
Addended by: Burnett Kanaris on: 07/28/2017 08:12 AM   Modules accepted: Orders

## 2017-07-30 LAB — PAP IG W/ RFLX HPV ASCU

## 2017-08-16 ENCOUNTER — Telehealth: Payer: Self-pay | Admitting: *Deleted

## 2017-08-16 DIAGNOSIS — B3731 Acute candidiasis of vulva and vagina: Secondary | ICD-10-CM

## 2017-08-16 DIAGNOSIS — B373 Candidiasis of vulva and vagina: Secondary | ICD-10-CM

## 2017-08-16 MED ORDER — TERCONAZOLE 0.4 % VA CREA: 1.0000 | TOPICAL_CREAM | Freq: Every day | VAGINAL | 0 refills | Status: DC

## 2017-08-16 NOTE — Telephone Encounter (Signed)
Left on voicemail Rx sent 

## 2017-08-16 NOTE — Telephone Encounter (Signed)
Okay for refill?  

## 2017-08-16 NOTE — Telephone Encounter (Signed)
Patient was seen on 07/27/17 was prescribed Terazol cream Rx, pt states she picked up Rx but lost Rx between travel. Pt asked if another Rx can be sent? Please advise

## 2017-12-17 ENCOUNTER — Other Ambulatory Visit: Payer: Self-pay | Admitting: Family

## 2017-12-17 DIAGNOSIS — Z1231 Encounter for screening mammogram for malignant neoplasm of breast: Secondary | ICD-10-CM

## 2018-01-26 ENCOUNTER — Ambulatory Visit
Admission: RE | Admit: 2018-01-26 | Discharge: 2018-01-26 | Disposition: A | Discharge status: Home / Self Care | Payer: BLUE CROSS/BLUE SHIELD | Source: Ambulatory Visit | Attending: Family | Admitting: Family

## 2018-01-26 DIAGNOSIS — Z1231 Encounter for screening mammogram for malignant neoplasm of breast: Secondary | ICD-10-CM

## 2018-08-02 ENCOUNTER — Ambulatory Visit: Payer: BLUE CROSS/BLUE SHIELD | Admitting: Women's Health

## 2018-08-02 ENCOUNTER — Encounter: Payer: Self-pay | Admitting: Women's Health

## 2018-08-02 VITALS — BP 132/80 | Ht 61.0 in | Wt 138.0 lb

## 2018-08-02 DIAGNOSIS — N898 Other specified noninflammatory disorders of vagina: Secondary | ICD-10-CM

## 2018-08-02 DIAGNOSIS — Z13820 Encounter for screening for osteoporosis: Secondary | ICD-10-CM | POA: Diagnosis not present

## 2018-08-02 DIAGNOSIS — Z01419 Encounter for gynecological examination (general) (routine) without abnormal findings: Secondary | ICD-10-CM | POA: Diagnosis not present

## 2018-08-02 DIAGNOSIS — Z79890 Hormone replacement therapy (postmenopausal): Secondary | ICD-10-CM | POA: Diagnosis not present

## 2018-08-02 LAB — WET PREP FOR TRICH, YEAST, CLUE

## 2018-08-02 MED ORDER — ESTRADIOL 0.0375 MG/24HR TD PTWK: MEDICATED_PATCH | TRANSDERMAL | 4 refills | Status: DC

## 2018-08-02 NOTE — Progress Notes (Signed)
ESTEFANIA KAMIYA 10/02/1956 [id number]    History:    Presents for annual exam.  2004 TAH with BSO for fibroids on Climara patch.  2007 VAIN 3 with normal Paps after.  Primary care  manages hypertension, hep C and hypercholesteremia.  Mother breast cancer at age 64 also with diabetes and hypertension.  01/2016 T score -1.6 at spine and femoral neck FRAX 3.9% / 0.6%.  Benign colon polyps on colonoscopy.  Past medical history, past surgical history, family history and social history were all reviewed and documented in the EPIC chart.  Helping to care for her brother with mental health issues.  Son is 18 doing well.  ROS:  A ROS was performed and pertinent positives and negatives are included.  Exam:  Vitals:   08/02/18 1451  Weight: 138 lb (62.6 kg)  Height: 5\' 1"  (1.549 m)   Body mass index is 26.07 kg/m.   General appearance:  Normal Thyroid:  Symmetrical, normal in size, without palpable masses or nodularity. Respiratory  Auscultation:  Clear without wheezing or rhonchi Cardiovascular  Auscultation:  Regular rate, without rubs, murmurs or gallops  Edema/varicosities:  Not grossly evident Abdominal  Soft,nontender, without masses, guarding or rebound.  Liver/spleen:  No organomegaly noted  Hernia:  None appreciated  Skin  Inspection:  Grossly normal   Breasts: Examined lying and sitting.     Right: Without masses, retractions, discharge or axillary adenopathy.     Left: Without masses, retractions, discharge or axillary adenopathy. Gentitourinary   Inguinal/mons:  Normal without inguinal adenopathy  External genitalia:  Normal  BUS/Urethra/Skene's glands:  Normal  Vagina:  Normal  Cervix: And uterus absent adnexa/parametria:     Rt: Without masses or tenderness.   Lt: Without masses or tenderness.  Anus and perineum: Normal  Digital rectal exam: Normal sphincter tone without palpated masses or tenderness  Assessment/Plan:  62 y.o. MWF G2, P1 for annual exam with  no complaints.  2004 TAH with BSO on Climara patch 0.0375 Smoker less than half pack per day Osteopenia without elevated FRAX 2007 VA IN 3 normal Paps after Pretension, hypercholesteremia, primary care manages labs and meds  Pain: HRT reviewed risks of blood clots, strokes and breast cancer, is on a low-dose Climara patch 0.0375 1 patch weekly, proper use reviewed, decreased risks with low-dose.  Would like to continue.  SBE's, continue annual 3D screening mammogram, calcium rich foods, vitamin D 2000 daily encouraged.  Aware of hazards of smoking tips to quit reviewed.  Repeat DEXA will schedule.  Reviewed importance of exercise, balance and weightbearing, home safety, fall prevention discussed.  Pap.   Huel Cote [facility], 2:53 PM 08/02/2018

## 2018-08-02 NOTE — Patient Instructions (Addendum)
Health Maintenance for Postmenopausal Women Menopause is a normal process in which your reproductive ability comes to an end. This process happens gradually over a span of months to years, usually between the ages of 22 and 9. Menopause is complete when you have missed 12 consecutive menstrual periods. It is important to talk with your health care provider about some of the most common conditions that affect postmenopausal women, such as heart disease, cancer, and bone loss (osteoporosis). Adopting a healthy lifestyle and getting preventive care can help to promote your health and wellness. Those actions can also lower your chances of developing some of these common conditions. What should I know about menopause? During menopause, you may experience a number of symptoms, such as:  Moderate-to-severe hot flashes.  Night sweats.  Decrease in sex drive.  Mood swings.  Headaches.  Tiredness.  Irritability.  Memory problems.  Insomnia.  Choosing to treat or not to treat menopausal changes is an individual decision that you make with your health care provider. What should I know about hormone replacement therapy and supplements? Hormone therapy products are effective for treating symptoms that are associated with menopause, such as hot flashes and night sweats. Hormone replacement carries certain risks, especially as you become older. If you are thinking about using estrogen or estrogen with progestin treatments, discuss the benefits and risks with your health care provider. What should I know about heart disease and stroke? Heart disease, heart attack, and stroke become more likely as you age. This may be due, in part, to the hormonal changes that your body experiences during menopause. These can affect how your body processes dietary fats, triglycerides, and cholesterol. Heart attack and stroke are both medical emergencies. There are many things that you can do to help prevent heart disease  and stroke:  Have your blood pressure checked at least every 1-2 years. High blood pressure causes heart disease and increases the risk of stroke.  If you are 53-22 years old, ask your health care provider if you should take aspirin to prevent a heart attack or a stroke.  Do not use any tobacco products, including cigarettes, chewing tobacco, or electronic cigarettes. If you need help quitting, ask your health care provider.  It is important to eat a healthy diet and maintain a healthy weight. ? Be sure to include plenty of vegetables, fruits, low-fat dairy products, and lean protein. ? Avoid eating foods that are high in solid fats, added sugars, or salt (sodium).  Get regular exercise. This is one of the most important things that you can do for your health. ? Try to exercise for at least 150 minutes each week. The type of exercise that you do should increase your heart rate and make you sweat. This is known as moderate-intensity exercise. ? Try to do strengthening exercises at least twice each week. Do these in addition to the moderate-intensity exercise.  Know your numbers.Ask your health care provider to check your cholesterol and your blood glucose. Continue to have your blood tested as directed by your health care provider.  What should I know about cancer screening? There are several types of cancer. Take the following steps to reduce your risk and to catch any cancer development as early as possible. Breast Cancer  Practice breast self-awareness. ? This means understanding how your breasts normally appear and feel. ? It also means doing regular breast self-exams. Let your health care provider know about any changes, no matter how small.  If you are 40  or older, have a clinician do a breast exam (clinical breast exam or CBE) every year. Depending on your age, family history, and medical history, it may be recommended that you also have a yearly breast X-ray (mammogram).  If you  have a family history of breast cancer, talk with your health care provider about genetic screening.  If you are at high risk for breast cancer, talk with your health care provider about having an MRI and a mammogram every year.  Breast cancer (BRCA) gene test is recommended for women who have family members with BRCA-related cancers. Results of the assessment will determine the need for genetic counseling and BRCA1 and for BRCA2 testing. BRCA-related cancers include these types: ? Breast. This occurs in males or females. ? Ovarian. ? Tubal. This may also be called fallopian tube cancer. ? Cancer of the abdominal or pelvic lining (peritoneal cancer). ? Prostate. ? Pancreatic.  Cervical, Uterine, and Ovarian Cancer Your health care provider may recommend that you be screened regularly for cancer of the pelvic organs. These include your ovaries, uterus, and vagina. This screening involves a pelvic exam, which includes checking for microscopic changes to the surface of your cervix (Pap test).  For women ages 21-65, health care providers may recommend a pelvic exam and a Pap test every three years. For women ages 79-65, they may recommend the Pap test and pelvic exam, combined with testing for human papilloma virus (HPV), every five years. Some types of HPV increase your risk of cervical cancer. Testing for HPV may also be done on women of any age who have unclear Pap test results.  Other health care providers may not recommend any screening for nonpregnant women who are considered low risk for pelvic cancer and have no symptoms. Ask your health care provider if a screening pelvic exam is right for you.  If you have had past treatment for cervical cancer or a condition that could lead to cancer, you need Pap tests and screening for cancer for at least 20 years after your treatment. If Pap tests have been discontinued for you, your risk factors (such as having a new sexual partner) need to be  reassessed to determine if you should start having screenings again. Some women have medical problems that increase the chance of getting cervical cancer. In these cases, your health care provider may recommend that you have screening and Pap tests more often.  If you have a family history of uterine cancer or ovarian cancer, talk with your health care provider about genetic screening.  If you have vaginal bleeding after reaching menopause, tell your health care provider.  There are currently no reliable tests available to screen for ovarian cancer.  Lung Cancer Lung cancer screening is recommended for adults 69-62 years old who are at high risk for lung cancer because of a history of smoking. A yearly low-dose CT scan of the lungs is recommended if you:  Currently smoke.  Have a history of at least 30 pack-years of smoking and you currently smoke or have quit within the past 15 years. A pack-year is smoking an average of one pack of cigarettes per day for one year.  Yearly screening should:  Continue until it has been 15 years since you quit.  Stop if you develop a health problem that would prevent you from having lung cancer treatment.  Colorectal Cancer  This type of cancer can be detected and can often be prevented.  Routine colorectal cancer screening usually begins at  age 42 and continues through age 45.  If you have risk factors for colon cancer, your health care provider may recommend that you be screened at an earlier age.  If you have a family history of colorectal cancer, talk with your health care provider about genetic screening.  Your health care provider may also recommend using home test kits to check for hidden blood in your stool.  A small camera at the end of a tube can be used to examine your colon directly (sigmoidoscopy or colonoscopy). This is done to check for the earliest forms of colorectal cancer.  Direct examination of the colon should be repeated every  5-10 years until age 71. However, if early forms of precancerous polyps or small growths are found or if you have a family history or genetic risk for colorectal cancer, you may need to be screened more often.  Skin Cancer  Check your skin from head to toe regularly.  Monitor any moles. Be sure to tell your health care provider: ? About any new moles or changes in moles, especially if there is a change in a mole's shape or color. ? If you have a mole that is larger than the size of a pencil eraser.  If any of your family members has a history of skin cancer, especially at a young age, talk with your health care provider about genetic screening.  Always use sunscreen. Apply sunscreen liberally and repeatedly throughout the day.  Whenever you are outside, protect yourself by wearing long sleeves, pants, a wide-brimmed hat, and sunglasses.  What should I know about osteoporosis? Osteoporosis is a condition in which bone destruction happens more quickly than new bone creation. After menopause, you may be at an increased risk for osteoporosis. To help prevent osteoporosis or the bone fractures that can happen because of osteoporosis, the following is recommended:  If you are 46-71 years old, get at least 1,000 mg of calcium and at least 600 mg of vitamin D per day.  If you are older than age 55 but younger than age 65, get at least 1,200 mg of calcium and at least 600 mg of vitamin D per day.  If you are older than age 54, get at least 1,200 mg of calcium and at least 800 mg of vitamin D per day.  Smoking and excessive alcohol intake increase the risk of osteoporosis. Eat foods that are rich in calcium and vitamin D, and do weight-bearing exercises several times each week as directed by your health care provider. What should I know about how menopause affects my mental health? Depression may occur at any age, but it is more common as you become older. Common symptoms of depression  include:  Low or sad mood.  Changes in sleep patterns.  Changes in appetite or eating patterns.  Feeling an overall lack of motivation or enjoyment of activities that you previously enjoyed.  Frequent crying spells.  Talk with your health care provider if you think that you are experiencing depression. What should I know about immunizations? It is important that you get and maintain your immunizations. These include:  Tetanus, diphtheria, and pertussis (Tdap) booster vaccine.  Influenza every year before the flu season begins.  Pneumonia vaccine.  Shingles vaccine.  Your health care provider may also recommend other immunizations. This information is not intended to replace advice given to you by your health care provider. Make sure you discuss any questions you have with your health care provider. Document Released: 12/18/2005  Document Revised: 05/15/2016 Document Reviewed: 07/30/2015 Elsevier Interactive Patient Education  2018 [employer] Coping with Quitting Smoking Quitting smoking is a physical and mental challenge. You will face cravings, withdrawal symptoms, and temptation. Before quitting, work with your health care provider to make a plan that can help you cope. Preparation can help you quit and keep you from giving in. How can I cope with cravings? Cravings usually last for 5-10 minutes. If you get through it, the craving will pass. Consider taking the following actions to help you cope with cravings:  Keep your mouth busy: ? Chew sugar-free gum. ? Suck on hard candies or a straw. ? Brush your teeth.  Keep your hands and body busy: ? Immediately change to a different activity when you feel a craving. ? Squeeze or play with a ball. ? Do an activity or a hobby, like making bead jewelry, practicing needlepoint, or working with wood. ? Mix up your normal routine. ? Take a short exercise break. Go for a quick walk or run up and down stairs. ? Spend time in public  places where smoking is not allowed.  Focus on doing something kind or helpful for someone else.  Call a friend or family member to talk during a craving.  Join a support group.  Call a quit line, such as 1-800-QUIT-NOW.  Talk with your health care provider about medicines that might help you cope with cravings and make quitting easier for you.  How can I deal with withdrawal symptoms? Your body may experience negative effects as it tries to get used to not having nicotine in the system. These effects are called withdrawal symptoms. They may include:  Feeling hungrier than normal.  Trouble concentrating.  Irritability.  Trouble sleeping.  Feeling depressed.  Restlessness and agitation.  Craving a cigarette.  To manage withdrawal symptoms:  Avoid places, people, and activities that trigger your cravings.  Remember why you want to quit.  Get plenty of sleep.  Avoid coffee and other caffeinated drinks. These may worsen some of your symptoms.  How can I handle social situations? Social situations can be difficult when you are quitting smoking, especially in the first few weeks. To manage this, you can:  Avoid parties, bars, and other social situations where people might be smoking.  Avoid alcohol.  Leave right away if you have the urge to smoke.  Explain to your family and friends that you are quitting smoking. Ask for understanding and support.  Plan activities with friends or family where smoking is not an option.  What are some ways I can cope with stress? Wanting to smoke may cause stress, and stress can make you want to smoke. Find ways to manage your stress. Relaxation techniques can help. For example:  Breathe slowly and deeply, in through your nose and out through your mouth.  Listen to soothing, relaxing music.  Talk with a family member or friend about your stress.  Light a candle.  Soak in a bath or take a shower.  Think about a peaceful  place.  What are some ways I can prevent weight gain? Be aware that many people gain weight after they quit smoking. However, not everyone does. To keep from gaining weight, have a plan in place before you quit and stick to the plan after you quit. Your plan should include:  Having healthy snacks. When you have a craving, it may help to: ? Eat plain popcorn, crunchy carrots, celery, or other cut vegetables. ? Chew   sugar-free gum.  Changing how you eat: ? Eat small portion sizes at meals. ? Eat 4-6 small meals throughout the day instead of 1-2 large meals a day. ? Be mindful when you eat. Do not watch television or do other things that might distract you as you eat.  Exercising regularly: ? Make time to exercise each day. If you do not have time for a long workout, do short bouts of exercise for 5-10 minutes several times a day. ? Do some form of strengthening exercise, like weight lifting, and some form of aerobic exercise, like running or swimming.  Drinking plenty of water or other low-calorie or no-calorie drinks. Drink 6-8 glasses of water daily, or as much as instructed by your health care provider.  Summary  Quitting smoking is a physical and mental challenge. You will face cravings, withdrawal symptoms, and temptation to smoke again. Preparation can help you as you go through these challenges.  You can cope with cravings by keeping your mouth busy (such as by chewing gum), keeping your body and hands busy, and making calls to family, friends, or a helpline for people who want to quit smoking.  You can cope with withdrawal symptoms by avoiding places where people smoke, avoiding drinks with caffeine, and getting plenty of rest.  Ask your health care provider about the different ways to prevent weight gain, avoid stress, and handle social situations. This information is not intended to replace advice given to you by your health care provider. Make sure you discuss any questions you  have with your health care provider. Document Released: 10/23/2016 Document Revised: 10/23/2016 Document Reviewed: 10/23/2016 Elsevier Interactive Patient Education  2018 [employer] Inc[employer]  

## 2018-08-02 NOTE — Addendum Note (Signed)
Addended by: Lorine Bears on: 08/02/2018 03:39 PM   Modules accepted: Orders

## 2018-08-03 LAB — PAP IG W/ RFLX HPV ASCU

## 2018-08-15 ENCOUNTER — Other Ambulatory Visit: Payer: Self-pay | Admitting: Gynecology

## 2018-08-15 ENCOUNTER — Ambulatory Visit (INDEPENDENT_AMBULATORY_CARE_PROVIDER_SITE_OTHER): Payer: BLUE CROSS/BLUE SHIELD

## 2018-08-15 ENCOUNTER — Other Ambulatory Visit: Payer: Self-pay | Admitting: Women's Health

## 2018-08-15 DIAGNOSIS — M8589 Other specified disorders of bone density and structure, multiple sites: Secondary | ICD-10-CM

## 2018-08-15 DIAGNOSIS — Z13820 Encounter for screening for osteoporosis: Secondary | ICD-10-CM | POA: Diagnosis not present

## 2018-08-23 ENCOUNTER — Encounter: Payer: Self-pay | Admitting: Gynecology

## 2019-01-31 ENCOUNTER — Other Ambulatory Visit: Payer: Self-pay | Admitting: Family

## 2019-01-31 DIAGNOSIS — Z1231 Encounter for screening mammogram for malignant neoplasm of breast: Secondary | ICD-10-CM

## 2019-03-27 ENCOUNTER — Ambulatory Visit: Payer: BLUE CROSS/BLUE SHIELD

## 2019-08-08 ENCOUNTER — Encounter: Payer: BLUE CROSS/BLUE SHIELD | Admitting: Women's Health

## 2019-08-16 ENCOUNTER — Encounter: Payer: Self-pay | Admitting: Gynecology

## 2019-08-21 ENCOUNTER — Other Ambulatory Visit: Payer: Self-pay

## 2019-08-22 ENCOUNTER — Encounter: Payer: Self-pay | Admitting: Women's Health

## 2019-08-22 ENCOUNTER — Ambulatory Visit (INDEPENDENT_AMBULATORY_CARE_PROVIDER_SITE_OTHER): Payer: Medicare Other | Admitting: Women's Health

## 2019-08-22 VITALS — BP 134/80 | Ht 61.0 in | Wt 148.0 lb

## 2019-08-22 DIAGNOSIS — N898 Other specified noninflammatory disorders of vagina: Secondary | ICD-10-CM

## 2019-08-22 DIAGNOSIS — Z01419 Encounter for gynecological examination (general) (routine) without abnormal findings: Secondary | ICD-10-CM | POA: Diagnosis not present

## 2019-08-22 DIAGNOSIS — Z9189 Other specified personal risk factors, not elsewhere classified: Secondary | ICD-10-CM | POA: Diagnosis not present

## 2019-08-22 MED ORDER — ESTRADIOL 0.025 MG/24HR TD PTWK: 0.0250 mg | MEDICATED_PATCH | TRANSDERMAL | 4 refills | Status: DC

## 2019-08-22 NOTE — Patient Instructions (Addendum)
Mammogram [id number]  Breast center Vit D3 2000 iu daily  Health Maintenance After Age 65 After age 11, you are at a higher risk for certain long-term diseases and infections as well as injuries from falls. Falls are a major cause of broken bones and head injuries in people who are older than age 76. Getting regular preventive care can help to keep you healthy and well. Preventive care includes getting regular testing and making lifestyle changes as recommended by your health care provider. Talk with your health care provider about:  Which screenings and tests you should have. A screening is a test that checks for a disease when you have no symptoms.  A diet and exercise plan that is right for you. What should I know about screenings and tests to prevent falls? Screening and testing are the best ways to find a health problem early. Early diagnosis and treatment give you the best chance of managing medical conditions that are common after age 18. Certain conditions and lifestyle choices may make you more likely to have a fall. Your health care provider may recommend:  Regular vision checks. Poor vision and conditions such as cataracts can make you more likely to have a fall. If you wear glasses, make sure to get your prescription updated if your vision changes.  Medicine review. Work with your health care provider to regularly review all of the medicines you are taking, including over-the-counter medicines. Ask your health care provider about any side effects that may make you more likely to have a fall. Tell your health care provider if any medicines that you take make you feel dizzy or sleepy.  Osteoporosis screening. Osteoporosis is a condition that causes the bones to get weaker. This can make the bones weak and cause them to break more easily.  Blood pressure screening. Blood pressure changes and medicines to control blood pressure can make you feel dizzy.  Strength and balance checks. Your  health care provider may recommend certain tests to check your strength and balance while standing, walking, or changing positions.  Foot health exam. Foot pain and numbness, as well as not wearing proper footwear, can make you more likely to have a fall.  Depression screening. You may be more likely to have a fall if you have a fear of falling, feel emotionally low, or feel unable to do activities that you used to do.  Alcohol use screening. Using too much alcohol can affect your balance and may make you more likely to have a fall. What actions can I take to lower my risk of falls? General instructions  Talk with your health care provider about your risks for falling. Tell your health care provider if: ? You fall. Be sure to tell your health care provider about all falls, even ones that seem minor. ? You feel dizzy, sleepy, or off-balance.  Take over-the-counter and prescription medicines only as told by your health care provider. These include any supplements.  Eat a healthy diet and maintain a healthy weight. A healthy diet includes low-fat dairy products, low-fat (lean) meats, and fiber from whole grains, beans, and lots of fruits and vegetables. Home safety  Remove any tripping hazards, such as rugs, cords, and clutter.  Install safety equipment such as grab bars in bathrooms and safety rails on stairs.  Keep rooms and walkways well-lit. Activity   Follow a regular exercise program to stay fit. This will help you maintain your balance. Ask your health care provider what types of exercise  are appropriate for you.  If you need a cane or walker, use it as recommended by your health care provider.  Wear supportive shoes that have nonskid soles. Lifestyle  Do not drink alcohol if your health care provider tells you not to drink.  If you drink alcohol, limit how much you have: ? 0-1 drink a day for women. ? 0-2 drinks a day for men.  Be aware of how much alcohol is in your  drink. In the [place] one drink equals one typical bottle of beer (12 oz), one-half glass of wine (5 oz), or one shot of hard liquor (1 oz).  Do not use any products that contain nicotine or tobacco, such as cigarettes and e-cigarettes. If you need help quitting, ask your health care provider. Summary  Having a healthy lifestyle and getting preventive care can help to protect your health and wellness after age 43.  Screening and testing are the best way to find a health problem early and help you avoid having a fall. Early diagnosis and treatment give you the best chance for managing medical conditions that are more common for people who are older than age 35.  Falls are a major cause of broken bones and head injuries in people who are older than age 54. Take precautions to prevent a fall at home.  Work with your health care provider to learn what changes you can make to improve your health and wellness and to prevent falls. This information is not intended to replace advice given to you by your health care provider. Make sure you discuss any questions you have with your health care provider. Document Released: 09/08/2017 Document Revised: 02/16/2019 Document Reviewed: 09/08/2017 Elsevier Patient Education  2020 [employer].

## 2019-08-22 NOTE — Progress Notes (Signed)
Brittany Duffy Mar 20, 1956 [id number]    History:    Presents for breast and pelvic exam with no complaints.  2004 TAH and BSO for fibroids on HRT.  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  Normal mammogram history.  2019 T score -1.9 FRAX 4.7% / 1%.  History of benign colon polyps 2015 10-year follow-up.  Primary care manages hypertension, hypercholesteremia and anxiety.  Current on vaccines, had Shingrix last year.  Past medical history, past surgical history, family history and social history were all reviewed and documented in the EPIC chart.  Retired, helping to care for her disabled brother.  1 son age 66 doing well.  ROS:  A ROS was performed and pertinent positives and negatives are included.  Exam:  Vitals:   08/22/19 1617  BP: 134/80  Weight: 148 lb (67.1 kg)  Height: 5\' 1"  (1.549 m)   Body mass index is 27.96 kg/m.   General appearance:  Normal Thyroid:  Symmetrical, normal in size, without palpable masses or nodularity. Respiratory  Auscultation:  Clear without wheezing or rhonchi Cardiovascular  Auscultation:  Regular rate, without rubs, murmurs or gallops  Edema/varicosities:  Not grossly evident Abdominal  Soft,nontender, without masses, guarding or rebound.  Liver/spleen:  No organomegaly noted  Hernia:  None appreciated  Skin  Inspection:  Grossly normal   Breasts: Examined lying and sitting.     Right: Without masses, retractions, discharge or axillary adenopathy.     Left: Without masses, retractions, discharge or axillary adenopathy. Gentitourinary   Inguinal/mons:  Normal without inguinal adenopathy  External genitalia:  Normal  BUS/Urethra/Skene's glands:  Normal  Vagina:  Normal  Cervix: And uterus absent  Adnexa/parametria:     Rt: Without masses or tenderness.   Lt: Without masses or tenderness.  Anus and perineum: Normal  Digital rectal exam: Normal sphincter tone without palpated masses or tenderness  Assessment/Plan:  63 y.o. S BF G2, P1  for breast and pelvic exam with occasional vaginal itching/burning.  2004 TAH with BSO on estradiol patch Hypertension, hypercholesterolemia, anxiety/depression, asthma -primary care manages labs and meds Smoker Osteopenia without elevated FRAX 2007 [facility] 3 normal Paps after  Plan: Estrogen risks of blood clots, strokes and breast cancer reviewed, currently on Climara 0.0375 patch will try 0.025 patch weekly prescription given instructed to stop patches altogether if able to tolerate hot flushes.  Reviewed risk for blood clots strokes and breast cancer decreased with lower dose.  Aware of hazards of smoking, tips to quit reviewed.  Reviewed importance of self-care, leisure activities and eliciting help as needed with caring for her disabled brother.  Aware of importance of weightbearing and balance type exercise, yoga encouraged, home safety and fall prevention discussed.  Wet prep negative, reviewed normality of wet prep and exam.    Huel Cote [facility], 5:02 PM 08/22/2019

## 2019-08-23 LAB — WET PREP FOR TRICH, YEAST, CLUE

## 2020-01-25 ENCOUNTER — Other Ambulatory Visit: Payer: Self-pay

## 2020-01-25 ENCOUNTER — Ambulatory Visit
Admission: RE | Admit: 2020-01-25 | Discharge: 2020-01-25 | Disposition: A | Discharge status: Home / Self Care | Payer: Medicare Other | Source: Ambulatory Visit | Attending: Family | Admitting: Family

## 2020-01-25 DIAGNOSIS — Z1231 Encounter for screening mammogram for malignant neoplasm of breast: Secondary | ICD-10-CM

## 2020-08-23 ENCOUNTER — Other Ambulatory Visit: Payer: Self-pay | Admitting: Family

## 2020-08-23 ENCOUNTER — Ambulatory Visit
Admission: RE | Admit: 2020-08-23 | Discharge: 2020-08-23 | Disposition: A | Discharge status: Home / Self Care | Payer: Medicare Other | Source: Ambulatory Visit | Attending: Family | Admitting: Family

## 2020-08-23 DIAGNOSIS — R059 Cough, unspecified: Secondary | ICD-10-CM

## 2020-08-27 ENCOUNTER — Ambulatory Visit (INDEPENDENT_AMBULATORY_CARE_PROVIDER_SITE_OTHER): Payer: Medicare Other | Admitting: Nurse Practitioner

## 2020-08-27 ENCOUNTER — Other Ambulatory Visit: Payer: Self-pay

## 2020-08-27 VITALS — BP 152/80 | Ht 61.0 in | Wt 149.0 lb

## 2020-08-27 DIAGNOSIS — Z01419 Encounter for gynecological examination (general) (routine) without abnormal findings: Secondary | ICD-10-CM

## 2020-08-27 DIAGNOSIS — Z23 Encounter for immunization: Secondary | ICD-10-CM

## 2020-08-27 DIAGNOSIS — Z90710 Acquired absence of both cervix and uterus: Secondary | ICD-10-CM | POA: Diagnosis not present

## 2020-08-27 DIAGNOSIS — Z79890 Hormone replacement therapy (postmenopausal): Secondary | ICD-10-CM

## 2020-08-27 DIAGNOSIS — Z780 Asymptomatic menopausal state: Secondary | ICD-10-CM | POA: Diagnosis not present

## 2020-08-27 DIAGNOSIS — M8580 Other specified disorders of bone density and structure, unspecified site: Secondary | ICD-10-CM | POA: Diagnosis not present

## 2020-08-27 DIAGNOSIS — Z13820 Encounter for screening for osteoporosis: Secondary | ICD-10-CM

## 2020-08-27 MED ORDER — ESTRADIOL 0.025 MG/24HR TD PTWK: 0.0250 mg | MEDICATED_PATCH | TRANSDERMAL | 4 refills | Status: DC

## 2020-08-27 NOTE — Progress Notes (Signed)
   Brittany Duffy [birth year] [id number]   History:  64 y.o. G2P1011 presents for breast and pelvic exam. 2004 TAH BSO for fibroids on HRT. She has decreased her Climara patch to half a patch weekly and is tolerated. 2007 VAIN 3, subsequent paps normal. Osteopenia. Normal mammogram history.   Gynecologic History Patient's last menstrual period was 01/23/2012.   Contraception: status post hysterectomy Last Pap: 08/02/2018. Results were: normal Last mammogram: 01/26/2020. Results were: normal Last colonoscopy: patient unsure of year. Results were: polyps Last Dexa: 08/15/2018. Results were: t-score -1.9, FRAX 4.7% / 1.0%  Past medical history, past surgical history, family history and social history were all reviewed and documented in the EPIC chart.  ROS:  A ROS was performed and pertinent positives and negatives are included.  Exam:  Vitals:   08/27/20 1537  BP: (!) 152/80  Weight: 149 lb (67.6 kg)  Height: 5\' 1"  (1.549 m)   Body mass index is 28.15 kg/m.  General appearance:  Normal Thyroid:  Symmetrical, normal in size, without palpable masses or nodularity. Respiratory  Auscultation:  Clear without wheezing or rhonchi Cardiovascular  Auscultation:  Regular rate, without rubs, murmurs or gallops  Edema/varicosities:  Not grossly evident Abdominal  Soft,nontender, without masses, guarding or rebound.  Liver/spleen:  No organomegaly noted  Hernia:  None appreciated  Skin  Inspection:  Grossly normal   Breasts: Examined lying and sitting.   Right: Without masses, retractions, discharge or axillary adenopathy.   Left: Without masses, retractions, discharge or axillary adenopathy. Gentitourinary   Inguinal/mons:  Normal without inguinal adenopathy  External genitalia:  Normal  BUS/Urethra/Skene's glands:  Normal  Vagina:  Normal  Cervix:  Absent  Uterus:  Absent  Adnexa/parametria:     Rt: Without masses or tenderness.   Lt: Without masses or tenderness.  Anus  and perineum: Normal  Digital rectal exam: Normal sphincter tone without palpated masses or tenderness  Assessment/Plan:  64 y.o. G2P1011 for breast and pelvic exam.   Well female exam with routine gynecological exam - Education provided on SBEs, importance of preventative screenings, current guidelines, high calcium diet, regular exercise, and multivitamin daily. Labs with PCP.   Osteopenia, unspecified location - Plan: DG Bone Density. Taking daily Vitamin D supplement. Encouraged to perform weightbearing exercises and walk daily.   History of total abdominal hysterectomy - 2004 for fibroids and menorrhagia  Screening for osteoporosis - Plan: DG Bone Density  Hormone replacement therapy - Plan: estradiol (CLIMARA) 0.025 mg/24hr patch. She has decreased to 1/2 patch weekly and is tolerating. She is aware of the risk for blood clots, heart attack, stroke, and breast cancer. Recommended she try to wean off this year. She is agreeable but would like refills in case she does not tolerate.  Need for immunization against influenza - Plan: Flu Vaccine QUAD 36+ mos IM  Screening for cervical cancer - 2007 VAIN 3, subsequent paps normal. Pap today.   Screening for breast cancer - normal mammogram history. Normal breast exam today.   Screening for colon cancer - she will return to screening colonoscopy at recommended interval.  Follow up in 1 year for annual.      Tamela Gammon [facility], 3:49 PM 08/27/2020

## 2020-08-27 NOTE — Patient Instructions (Signed)

## 2020-08-28 NOTE — Addendum Note (Signed)
Addended by: Lorine Bears on: 08/28/2020 08:27 AM   Modules accepted: Orders

## 2020-08-29 LAB — PAP IG W/ RFLX HPV ASCU

## 2020-10-16 ENCOUNTER — Ambulatory Visit (INDEPENDENT_AMBULATORY_CARE_PROVIDER_SITE_OTHER): Payer: Medicare Other

## 2020-10-16 ENCOUNTER — Encounter (INDEPENDENT_AMBULATORY_CARE_PROVIDER_SITE_OTHER): Payer: Self-pay

## 2020-10-16 ENCOUNTER — Other Ambulatory Visit: Payer: Self-pay

## 2020-10-16 ENCOUNTER — Other Ambulatory Visit: Payer: Self-pay | Admitting: Nurse Practitioner

## 2020-10-16 DIAGNOSIS — M8580 Other specified disorders of bone density and structure, unspecified site: Secondary | ICD-10-CM

## 2020-10-16 DIAGNOSIS — M8589 Other specified disorders of bone density and structure, multiple sites: Secondary | ICD-10-CM

## 2020-10-16 DIAGNOSIS — Z780 Asymptomatic menopausal state: Secondary | ICD-10-CM

## 2020-10-16 DIAGNOSIS — Z13820 Encounter for screening for osteoporosis: Secondary | ICD-10-CM

## 2021-02-05 ENCOUNTER — Other Ambulatory Visit: Payer: Self-pay | Admitting: Internal Medicine

## 2021-04-06 NOTE — Progress Notes (Signed)
Virtual Visit via Telephone Note  I connected with Brittany Duffy, on 04/08/2021 at 4:05 PM by telephone due to the COVID-19 pandemic and verified that I am speaking with the correct person using two identifiers.  Due to current restrictions/limitations of in-office visits due to the COVID-19 pandemic, this scheduled clinical appointment was converted to a telehealth visit.   Consent: I discussed the limitations, risks, security and privacy concerns of performing an evaluation and management service by telephone and the availability of in person appointments. I also discussed with the patient that there may be a patient responsible charge related to this service. The patient expressed understanding and agreed to proceed.   Location of Patient: Home  Location of Provider: [facility] participating in Telemedicine visit: Janann Colonel, NP Elmon Else, CMA   History of Present Illness: Brittany Duffy is a 65 year-old female who presents to establish care. PMH significant for hypertension, osteopenia, and fibroids.   Current issues and/or concerns: Reports she does take Alprazolam for anxiety management.    Past Medical History:  Diagnosis Date  . Bronchitis   . Dysplasia of cervix, low grade (CIN 1)    S/P CRYO  . Fibroid   . Hepatitis C   . Hypertension   . Osteopenia 08/2018   T score -1.9 FRAX 4.7% / 1%  . Vaginal intraepithelial neoplasia III (VAIN III)    No Known Allergies  Current Outpatient Medications on File Prior to Visit  Medication Sig Dispense Refill  . ALPRAZolam (XANAX) 0.5 MG tablet Take 0.5 mg by mouth.   1  . amLODipine (NORVASC) 10 MG tablet   2  . Ascorbic Acid (VITAMIN C PO) Take 1 tablet by mouth daily.     Marland Kitchen atorvastatin (LIPITOR) 10 MG tablet Take 10 mg by mouth daily.    Marland Kitchen CALCIUM PO Take 1 tablet by mouth daily.     . cetirizine (ZYRTEC) 10 MG tablet Take 10 mg  by mouth 2 (two) times daily.    . Cholecalciferol (VITAMIN D PO) Take by mouth.    . Cyanocobalamin (VITAMIN B 12 PO) Take by mouth.    . estradiol (CLIMARA) 0.025 mg/24hr patch Place 1 patch (0.025 mg total) onto the skin once a week. 12 patch 4  . fish oil-omega-3 fatty acids 1000 MG capsule Take 2 g by mouth daily.      Marland Kitchen gabapentin (NEURONTIN) 300 MG capsule Take 1 capsule by mouth.  1  . GARLIC PO Take by mouth.    . lidocaine (XYLOCAINE) 5 % ointment   3  . losartan (COZAAR) 25 MG tablet Take 25 mg by mouth daily.    Marland Kitchen MILK THISTLE PO Take by mouth.    . mometasone (ELOCON) 0.1 % cream Use once daily on affected skin as directed. 90 g 2  . montelukast (SINGULAIR) 10 MG tablet   1  . Multiple Vitamin (MULTIVITAMIN PO) Take 1 tablet by mouth daily. Reported on 04/28/2016    . Nutritional Supplements (NUTRITIONAL SUPPLEMENT PO) Take by mouth. Oceans Alive: [employer]    . VITAMIN A PO Take by mouth.    Marland Kitchen VITAMIN E PO Take 1 tablet by mouth daily.      No current facility-administered medications on file prior to visit.    Observations/Objective: Alert and oriented x 3. Not in acute distress. Physical examination not completed as this is a telemedicine visit.  Assessment and  Plan: 1. Encounter to establish care: - Patient presents today to establish care.  - Return for annual physical examination, labs, and health maintenance. Arrive fasting meaning having no food for at least 8 hours prior to appointment. You may have only water or black coffee. Please take scheduled medications as normal.  2. Anxiety and depression: - Referral to Psychiatry for further evaluation and management of Alprazolam.  - Ambulatory referral to Psychiatry   Follow Up Instructions: Return for annual physical exam.   Patient was given clear instructions to go to Emergency Department or return to medical center if symptoms don't improve, worsen, or new problems develop.The patient verbalized  understanding.  I discussed the assessment and treatment plan with the patient. The patient was provided an opportunity to ask questions and all were answered. The patient agreed with the plan and demonstrated an understanding of the instructions.   The patient was advised to call back or seek an in-person evaluation if the symptoms worsen or if the condition fails to improve as anticipated.   I provided 10 minutes total of non-face-to-face time during this encounter.   Camillia Herter, NP  [facility] [address] [place], [place] 04/08/2021, 4:05 PM

## 2021-04-08 ENCOUNTER — Other Ambulatory Visit: Payer: Self-pay

## 2021-04-08 ENCOUNTER — Telehealth (INDEPENDENT_AMBULATORY_CARE_PROVIDER_SITE_OTHER): Payer: Medicare Other | Admitting: Family

## 2021-04-08 DIAGNOSIS — F32A Depression, unspecified: Secondary | ICD-10-CM | POA: Diagnosis not present

## 2021-04-08 DIAGNOSIS — F419 Anxiety disorder, unspecified: Secondary | ICD-10-CM | POA: Diagnosis not present

## 2021-04-08 DIAGNOSIS — Z7689 Persons encountering health services in other specified circumstances: Secondary | ICD-10-CM | POA: Diagnosis not present

## 2021-04-08 NOTE — Progress Notes (Signed)
Establish care

## 2021-05-08 NOTE — Progress Notes (Signed)
Patient ID: Brittany Duffy, female    DOB: [birth year]  MRN: [id number]  CC: Annual Physical Exam  Subjective: Brittany Duffy is a 65 y.o. female who presents for annual physical exam.  Her concerns today include:   HYPERTENSION: Doing well on current regimen. No side effects. No issues/concerns.  Currently taking: see medication list Med Adherence: $RemoveBefore'[x]'arnBnCNeazkDl$  Yes    '[]'$  No Medication side effects: $RemoveBefor'[]'rBAKDofUGdpY$  Yes    '[x]'$  No  2. LUNG CANCER SCREENING: Right upper back pain since 2020. Today initially reported she would like an MRI of the right upper back.   Later during the visit decided she will wait for the MRI and would rather be screened for lung cancer. Reports lingering cough and history of chronic bronchitis. Smoking on average 1.5 pack cigarettes daily for at least 30 years or more.    Patient Active Problem List   Diagnosis Date Noted   History of total abdominal hysterectomy 08/27/2020   Hepatitis, viral 09/25/2015   Allergic urticaria 09/25/2015   Cigarette smoker one half pack a day or less 02/17/2013   Ovarian abscess    Vaginal intraepithelial neoplasia III (VAIN III)    Osteopenia      Current Outpatient Medications on File Prior to Visit  Medication Sig Dispense Refill   ALPRAZolam (XANAX) 0.5 MG tablet Take 0.5 mg by mouth.   1   Ascorbic Acid (VITAMIN C PO) Take 1 tablet by mouth daily.      atorvastatin (LIPITOR) 10 MG tablet Take 10 mg by mouth daily.     CALCIUM PO Take 1 tablet by mouth daily.      cetirizine (ZYRTEC) 10 MG tablet Take 10 mg by mouth 2 (two) times daily.     Cholecalciferol (VITAMIN D PO) Take by mouth.     Cyanocobalamin (VITAMIN B 12 PO) Take by mouth.     estradiol (CLIMARA) 0.025 mg/24hr patch Place 1 patch (0.025 mg total) onto the skin once a week. 12 patch 4   fish oil-omega-3 fatty acids 1000 MG capsule Take 2 g by mouth daily.       gabapentin (NEURONTIN) 300 MG capsule Take 1 capsule by mouth.  1   GARLIC PO Take by mouth.      lidocaine (XYLOCAINE) 5 % ointment   3   MILK THISTLE PO Take by mouth.     mometasone (ELOCON) 0.1 % cream Use once daily on affected skin as directed. 90 g 2   montelukast (SINGULAIR) 10 MG tablet   1   Multiple Vitamin (MULTIVITAMIN PO) Take 1 tablet by mouth daily. Reported on 04/28/2016     Nutritional Supplements (NUTRITIONAL SUPPLEMENT PO) Take by mouth. Oceans Alive: [employer]ytoplankton     VITAMIN A PO Take by mouth.     VITAMIN E PO Take 1 tablet by mouth daily.      No current facility-administered medications on file prior to visit.    No Known Allergies  Social History   Socioeconomic History   Marital status: Single    Spouse name: Not on file   Number of children: Not on file   Years of education: Not on file   Highest education level: Not on file  Occupational History   Not on file  Tobacco Use   Smoking status: Every Day    Packs/day: 1.50    Pack years: 0.00    Types: Cigarettes   Smokeless tobacco: Current  Vaping Use   Vaping Use: Never  used  Substance and Sexual Activity   Alcohol use: Yes    Alcohol/week: 0.0 standard drinks    Comment: occ.   Drug use: No   Sexual activity: Yes    Partners: Male    Birth control/protection: Surgical, None, Condom    Comment: Hyst., INTERCOURSE AGE 74, SEXUAL PARTNERS MORE THAN 5  Other Topics Concern   Not on file  Social History Narrative   Not on file   Social Determinants of Health   Financial Resource Strain: Not on file  Food Insecurity: Not on file  Transportation Needs: Not on file  Physical Activity: Not on file  Stress: Not on file  Social Connections: Not on file  Intimate Partner Violence: Not on file    Family History  Problem Relation Age of Onset   Diabetes Mother    Hypertension Mother    Breast cancer Mother        Age Late 73's   Hypertension Maternal Grandfather     Past Surgical History:  Procedure Laterality Date   CARPAL TUNNEL RELEASE     CO2 LSAER OF VAGINA  07, 11    DIVERTICULUM OF THE BLADDER     OOPHORECTOMY     BSO   TOTAL ABDOMINAL HYSTERECTOMY  2004   BSO    ROS: Review of Systems Negative except as stated above  PHYSICAL EXAM: BP 139/86 (BP Location: Left Arm, Patient Position: Sitting, Cuff Size: Normal)   Pulse 94   Temp 98.4 F (36.9 C)   Resp 16   Ht 5' 0.98" (1.549 m)   Wt 143 lb 3.2 oz (65 kg)   LMP 01/23/2012   SpO2 96%   BMI 27.07 kg/m   Physical Exam HENT:     Head: Normocephalic and atraumatic.     Right Ear: Tympanic membrane, ear canal and external ear normal.     Left Ear: Tympanic membrane, ear canal and external ear normal.  Eyes:     Extraocular Movements: Extraocular movements intact.     Conjunctiva/sclera: Conjunctivae normal.     Pupils: Pupils are equal, round, and reactive to light.  Cardiovascular:     Rate and Rhythm: Normal rate and regular rhythm.     Pulses: Normal pulses.     Heart sounds: Normal heart sounds.  Pulmonary:     Effort: Pulmonary effort is normal.     Breath sounds: Normal breath sounds.  Abdominal:     General: Bowel sounds are normal.     Palpations: Abdomen is soft.  Musculoskeletal:        General: Normal range of motion.     Cervical back: Normal range of motion and neck supple.     Comments: Tenderness to palpation of right upper back.  Skin:    General: Skin is warm and dry.     Capillary Refill: Capillary refill takes less than 2 seconds.  Neurological:     General: No focal deficit present.     Mental Status: She is alert and oriented to person, place, and time.  Psychiatric:        Mood and Affect: Mood normal.        Behavior: Behavior normal.    ASSESSMENT AND PLAN: 1. Annual physical exam: - Counseled on 150 minutes of exercise per week as tolerated, healthy eating (including decreased daily intake of saturated fats, cholesterol, added sugars, sodium), STI prevention, and routine healthcare maintenance.  2. Screening for metabolic disorder: - BXU38+BFXO to  check kidney  function, liver function, and electrolyte balance.  - CMP14+EGFR  3. Screening for deficiency anemia: - CBC to screen for anemia. - CBC  4. Diabetes mellitus screening: - Hemoglobin A1c to screen for pre-diabetes/diabetes. - Hemoglobin A1c  5. Thyroid disorder screen: - TSH to check thyroid function.  - TSH  6. Hyperlipidemia, unspecified hyperlipidemia type: -Practice low-fat heart healthy diet and at least 150 minutes of moderate intensity exercise weekly as tolerated.  - Continue Atorvastatin as prescribed.  - Lipid panel to screen for high cholesterol.  - Follow-up with primary provider as scheduled.  - Lipid panel - atorvastatin (LIPITOR) 10 MG tablet; Take 1 tablet (10 mg total) by mouth daily.  Dispense: 120 tablet; Refill: 0  7. Pap smear for cervical cancer screening: - Patient declined and reported most recent PAP was October 2021 with Gynecology.  8. Colon cancer screening: - Referral to Gastroenterology for colon cancer screening by colonoscopy. - Ambulatory referral to Gastroenterology  9. Essential hypertension: - Continue Amlodipine and Losartan as prescribed.  - Counseled on blood pressure goal of less than 140/90, low-sodium, DASH diet, medication compliance, 150 minutes of moderate intensity exercise per week as tolerated. Discussed medication compliance, adverse effects. - Follow-up with primary provider in 3 months or sooner if needed.  - amLODipine (NORVASC) 10 MG tablet; Take 1 tablet (10 mg total) by mouth daily.  Dispense: 90 tablet; Refill: 0 - losartan (COZAAR) 25 MG tablet; Take 1 tablet (25 mg total) by mouth daily.  Dispense: 90 tablet; Refill: 0  10. Perennial allergic rhinitis: - Continue Fluticasone as prescribed.  - Follow-up with primary provider as scheduled.  - fluticasone (FLONASE) 50 MCG/ACT nasal spray; Place 2 sprays into both nostrils daily.  Dispense: 16 g; Refill: 0  11. Screening for lung cancer: - CT chest for lung  cancer screening.  - CT CHEST LUNG CA SCREEN LOW DOSE W/O CM; Future  12. Chronic right-sided thoracic back pain: - Patient elected to hold management until after lung cancer screening.  - Follow-up with primary provider as scheduled.    Patient was given the opportunity to ask questions.  Patient verbalized understanding of the plan and was able to repeat key elements of the plan. Patient was given clear instructions to go to Emergency Department or return to medical center if symptoms don't improve, worsen, or new problems develop.The patient verbalized understanding.   Orders Placed This Encounter  Procedures   CT CHEST LUNG CA SCREEN LOW DOSE W/O CM   CBC   Lipid panel   TSH   Hemoglobin A1c   CMP14+EGFR   Ambulatory referral to Gastroenterology     Requested Prescriptions   Signed Prescriptions Disp Refills   fluticasone (FLONASE) 50 MCG/ACT nasal spray 16 g 0    Sig: Place 2 sprays into both nostrils daily.   amLODipine (NORVASC) 10 MG tablet 90 tablet 0    Sig: Take 1 tablet (10 mg total) by mouth daily.   losartan (COZAAR) 25 MG tablet 90 tablet 0    Sig: Take 1 tablet (25 mg total) by mouth daily.    Return in about 3 months (around 08/09/2021) for Follow-Up hypertension .  Camillia Herter, NP

## 2021-05-09 ENCOUNTER — Ambulatory Visit (INDEPENDENT_AMBULATORY_CARE_PROVIDER_SITE_OTHER): Payer: Medicare Other | Admitting: Family

## 2021-05-09 ENCOUNTER — Encounter: Payer: Self-pay | Admitting: Family

## 2021-05-09 ENCOUNTER — Other Ambulatory Visit: Payer: Self-pay

## 2021-05-09 VITALS — BP 139/86 | HR 94 | Temp 98.4°F | Resp 16 | Ht 60.98 in | Wt 143.2 lb

## 2021-05-09 DIAGNOSIS — I10 Essential (primary) hypertension: Secondary | ICD-10-CM | POA: Diagnosis not present

## 2021-05-09 DIAGNOSIS — Z130 Encounter for screening for diseases of the blood and blood-forming organs and certain disorders involving the immune mechanism: Secondary | ICD-10-CM

## 2021-05-09 DIAGNOSIS — Z1329 Encounter for screening for other suspected endocrine disorder: Secondary | ICD-10-CM

## 2021-05-09 DIAGNOSIS — Z0000 Encounter for general adult medical examination without abnormal findings: Secondary | ICD-10-CM | POA: Diagnosis not present

## 2021-05-09 DIAGNOSIS — J3089 Other allergic rhinitis: Secondary | ICD-10-CM | POA: Diagnosis not present

## 2021-05-09 DIAGNOSIS — Z131 Encounter for screening for diabetes mellitus: Secondary | ICD-10-CM

## 2021-05-09 DIAGNOSIS — R7303 Prediabetes: Secondary | ICD-10-CM

## 2021-05-09 DIAGNOSIS — Z122 Encounter for screening for malignant neoplasm of respiratory organs: Secondary | ICD-10-CM

## 2021-05-09 DIAGNOSIS — Z124 Encounter for screening for malignant neoplasm of cervix: Secondary | ICD-10-CM

## 2021-05-09 DIAGNOSIS — M546 Pain in thoracic spine: Secondary | ICD-10-CM

## 2021-05-09 DIAGNOSIS — Z13228 Encounter for screening for other metabolic disorders: Secondary | ICD-10-CM

## 2021-05-09 DIAGNOSIS — Z1211 Encounter for screening for malignant neoplasm of colon: Secondary | ICD-10-CM

## 2021-05-09 DIAGNOSIS — E785 Hyperlipidemia, unspecified: Secondary | ICD-10-CM | POA: Diagnosis not present

## 2021-05-09 DIAGNOSIS — G8929 Other chronic pain: Secondary | ICD-10-CM

## 2021-05-09 MED ORDER — FLUTICASONE PROPIONATE 50 MCG/ACT NA SUSP: 2.0000 | Freq: Every day | NASAL | 0 refills | Status: DC

## 2021-05-09 MED ORDER — AMLODIPINE BESYLATE 10 MG PO TABS: 10.0000 mg | ORAL_TABLET | Freq: Every day | ORAL | 0 refills | Status: DC

## 2021-05-09 MED ORDER — LOSARTAN POTASSIUM 25 MG PO TABS: 25.0000 mg | ORAL_TABLET | Freq: Every day | ORAL | 0 refills | Status: DC

## 2021-05-09 NOTE — Patient Instructions (Signed)
Preventive Care 67 Years and Older, Female Preventive care refers to lifestyle choices and visits with your health care provider that can promote health and wellness. This includes: A yearly physical exam. This is also called an annual wellness visit. Regular dental and eye exams. Immunizations. Screening for certain conditions. Healthy lifestyle choices, such as: Eating a healthy diet. Getting regular exercise. Not using drugs or products that contain nicotine and tobacco. Limiting alcohol use. What can I expect for my preventive care visit? Physical exam Your health care provider will check your: Height and weight. These may be used to calculate your BMI (body mass index). BMI is a measurement that tells if you are at a healthy weight. Heart rate and blood pressure. Body temperature. Skin for abnormal spots. Counseling Your health care provider may ask you questions about your: Past medical problems. Family's medical history. Alcohol, tobacco, and drug use. Emotional well-being. Home life and relationship well-being. Sexual activity. Diet, exercise, and sleep habits. History of falls. Memory and ability to understand (cognition). Work and work Statistician. Pregnancy and menstrual history. Access to firearms. What immunizations do I need?  Vaccines are usually given at various ages, according to a schedule. Your health care provider will recommend vaccines for you based on your age, medicalhistory, and lifestyle or other factors, such as travel or where you work. What tests do I need? Blood tests Lipid and cholesterol levels. These may be checked every 5 years, or more often depending on your overall health. Hepatitis C test. Hepatitis B test. Screening Lung cancer screening. You may have this screening every year starting at age 44 if you have a 30-pack-year history of smoking and currently smoke or have quit within the past 15 years. Colorectal cancer screening. All  adults should have this screening starting at age 39 and continuing until age 65. Your health care provider may recommend screening at age 61 if you are at increased risk. You will have tests every 1-10 years, depending on your results and the type of screening test. Diabetes screening. This is done by checking your blood sugar (glucose) after you have not eaten for a while (fasting). You may have this done every 1-3 years. Mammogram. This may be done every 1-2 years. Talk with your health care provider about how often you should have regular mammograms. Abdominal aortic aneurysm (AAA) screening. You may need this if you are a current or former smoker. BRCA-related cancer screening. This may be done if you have a family history of breast, ovarian, tubal, or peritoneal cancers. Other tests STD (sexually transmitted disease) testing, if you are at risk. Bone density scan. This is done to screen for osteoporosis. You may have this done starting at age 54. Talk with your health care provider about your test results, treatment options,and if necessary, the need for more tests. Follow these instructions at home: Eating and drinking  Eat a diet that includes fresh fruits and vegetables, whole grains, lean protein, and low-fat dairy products. Limit your intake of foods with high amounts of sugar, saturated fats, and salt. Take vitamin and mineral supplements as recommended by your health care provider. Do not drink alcohol if your health care provider tells you not to drink. If you drink alcohol: Limit how much you have to 0-1 drink a day. Be aware of how much alcohol is in your drink. In the [place] one drink equals one 12 oz bottle of beer (355 mL), one 5 oz glass of wine (148 mL), or one 1  oz glass of hard liquor (44 mL).  Lifestyle Take daily care of your teeth and gums. Brush your teeth every morning and night with fluoride toothpaste. Floss one time each day. Stay active. Exercise for at  least 30 minutes 5 or more days each week. Do not use any products that contain nicotine or tobacco, such as cigarettes, e-cigarettes, and chewing tobacco. If you need help quitting, ask your health care provider. Do not use drugs. If you are sexually active, practice safe sex. Use a condom or other form of protection in order to prevent STIs (sexually transmitted infections). Talk with your health care provider about taking a low-dose aspirin or statin. Find healthy ways to cope with stress, such as: Meditation, yoga, or listening to music. Journaling. Talking to a trusted person. Spending time with friends and family. Safety Always wear your seat belt while driving or riding in a vehicle. Do not drive: If you have been drinking alcohol. Do not ride with someone who has been drinking. When you are tired or distracted. While texting. Wear a helmet and other protective equipment during sports activities. If you have firearms in your house, make sure you follow all gun safety procedures. What's next? Visit your health care provider once a year for an annual wellness visit. Ask your health care provider how often you should have your eyes and teeth checked. Stay up to date on all vaccines. This information is not intended to replace advice given to you by your health care provider. Make sure you discuss any questions you have with your healthcare provider. Document Revised: 10/16/2020 Document Reviewed: 10/20/2018 Elsevier Patient Education  2022 [employer].

## 2021-05-09 NOTE — Progress Notes (Signed)
Pt presents for annual physical exam, pt declines pap states has GYN, last pap 08/2020.pt thinks she may need MRI due to pain in right shoulder that is radiating to her back

## 2021-05-10 DIAGNOSIS — R7303 Prediabetes: Secondary | ICD-10-CM | POA: Insufficient documentation

## 2021-05-10 DIAGNOSIS — E785 Hyperlipidemia, unspecified: Secondary | ICD-10-CM | POA: Insufficient documentation

## 2021-05-10 LAB — CBC
Hematocrit: 38.1 % (ref 34.0–46.6)
Hemoglobin: 13.4 g/dL (ref 11.1–15.9)
MCH: 31.5 pg (ref 26.6–33.0)
MCHC: 35.2 g/dL (ref 31.5–35.7)
MCV: 90 fL (ref 79–97)
Platelets: 176 10*3/uL (ref 150–450)
RBC: 4.25 x10E6/uL (ref 3.77–5.28)
RDW: 12.8 % (ref 11.7–15.4)
WBC: 5.7 10*3/uL (ref 3.4–10.8)

## 2021-05-10 LAB — CMP14+EGFR
ALT: 12 IU/L (ref 0–32)
AST: 12 IU/L (ref 0–40)
Albumin/Globulin Ratio: 1.8 (ref 1.2–2.2)
Albumin: 4.8 g/dL (ref 3.8–4.8)
Alkaline Phosphatase: 75 IU/L (ref 44–121)
BUN/Creatinine Ratio: 13 (ref 12–28)
BUN: 10 mg/dL (ref 8–27)
Bilirubin Total: 0.3 mg/dL (ref 0.0–1.2)
CO2: 22 mmol/L (ref 20–29)
Calcium: 10 mg/dL (ref 8.7–10.3)
Chloride: 101 mmol/L (ref 96–106)
Creatinine, Ser: 0.8 mg/dL (ref 0.57–1.00)
Globulin, Total: 2.7 g/dL (ref 1.5–4.5)
Glucose: 81 mg/dL (ref 65–99)
Potassium: 4.1 mmol/L (ref 3.5–5.2)
Sodium: 141 mmol/L (ref 134–144)
Total Protein: 7.5 g/dL (ref 6.0–8.5)
eGFR: 81 mL/min/{1.73_m2} (ref >59)

## 2021-05-10 LAB — LIPID PANEL
Chol/HDL Ratio: 3.8 ratio (ref 0.0–4.4)
Cholesterol, Total: 205 mg/dL — ABNORMAL HIGH (ref 100–199)
HDL: 54 mg/dL (ref >39)
LDL Chol Calc (NIH): 129 mg/dL — ABNORMAL HIGH (ref 0–99)
Triglycerides: 124 mg/dL (ref 0–149)
VLDL Cholesterol Cal: 22 mg/dL (ref 5–40)

## 2021-05-10 LAB — TSH: TSH: 0.944 u[IU]/mL (ref 0.450–4.500)

## 2021-05-10 LAB — HEMOGLOBIN A1C
Est. average glucose Bld gHb Est-mCnc: 131 mg/dL
Hgb A1c MFr Bld: 6.2 % — ABNORMAL HIGH (ref 4.8–5.6)

## 2021-05-10 MED ORDER — ATORVASTATIN CALCIUM 10 MG PO TABS: 10.0000 mg | ORAL_TABLET | Freq: Every day | ORAL | 0 refills | Status: DC

## 2021-05-10 NOTE — Progress Notes (Signed)
Please call patient with update.   Kidney function normal.   Liver function normal.   Thyroid function normal.   Hemoglobin A1c is consistent with pre-diabetes. Practice healthy eating habits of fresh fruit and vegetables, lean baked meats such as chicken, fish, and [place]; limit breads, rice, pastas, and desserts; practice regular aerobic exercise (at least 150 minutes a week as tolerated). No medication needed at the moment. Encouraged to recheck in 6 months.   Cholesterol higher than expected. High cholesterol may increase risk of heart attack and/or stroke. Consider eating more fruits, vegetables, and lean baked meats such as chicken or fish. Moderate intensity exercise at least 150 minutes as tolerated per week may help as well. Continue Atorvastatin at current dose.

## 2021-05-15 ENCOUNTER — Other Ambulatory Visit: Payer: Self-pay

## 2021-05-15 DIAGNOSIS — J3089 Other allergic rhinitis: Secondary | ICD-10-CM

## 2021-05-20 ENCOUNTER — Telehealth: Payer: Self-pay

## 2021-05-20 NOTE — Telephone Encounter (Signed)
Patient call

## 2021-08-07 NOTE — Progress Notes (Deleted)
Patient ID: Brittany Duffy, female    DOB: 1955-12-01  MRN: [id number]  CC: Hypertension Follow-Up  Subjective: Brittany Duffy is a 65 y.o. female who presents for hypertension follow-up.   Her concerns today include:   HYPERTENSION FOLLOW-UP: 05/09/2021: - Continue Amlodipine and Losartan as prescribed.   10/32022:  Patient Active Problem List   Diagnosis Date Noted   Prediabetes 05/10/2021   Hyperlipidemia 05/10/2021   History of total abdominal hysterectomy 08/27/2020   Hepatitis, viral 09/25/2015   Allergic urticaria 09/25/2015   Cigarette smoker one half pack a day or less 02/17/2013   Ovarian abscess    Vaginal intraepithelial neoplasia III (VAIN III)    Osteopenia      Current Outpatient Medications on File Prior to Visit  Medication Sig Dispense Refill   ALPRAZolam (XANAX) 0.5 MG tablet Take 0.5 mg by mouth.   1   amLODipine (NORVASC) 10 MG tablet Take 1 tablet (10 mg total) by mouth daily. 90 tablet 0   Ascorbic Acid (VITAMIN C PO) Take 1 tablet by mouth daily.      atorvastatin (LIPITOR) 10 MG tablet Take 1 tablet (10 mg total) by mouth daily. 120 tablet 0   CALCIUM PO Take 1 tablet by mouth daily.      cetirizine (ZYRTEC) 10 MG tablet Take 10 mg by mouth 2 (two) times daily.     Cholecalciferol (VITAMIN D PO) Take by mouth.     Cyanocobalamin (VITAMIN B 12 PO) Take by mouth.     estradiol (CLIMARA) 0.025 mg/24hr patch Place 1 patch (0.025 mg total) onto the skin once a week. 12 patch 4   fish oil-omega-3 fatty acids 1000 MG capsule Take 2 g by mouth daily.       fluticasone (FLONASE) 50 MCG/ACT nasal spray Place 2 sprays into both nostrils daily. 16 g 0   gabapentin (NEURONTIN) 300 MG capsule Take 1 capsule by mouth.  1   GARLIC PO Take by mouth.     lidocaine (XYLOCAINE) 5 % ointment   3   losartan (COZAAR) 25 MG tablet Take 1 tablet (25 mg total) by mouth daily. 90 tablet 0   MILK THISTLE PO Take by mouth.     mometasone (ELOCON) 0.1 % cream  Use once daily on affected skin as directed. 90 g 2   montelukast (SINGULAIR) 10 MG tablet   1   Multiple Vitamin (MULTIVITAMIN PO) Take 1 tablet by mouth daily. Reported on 04/28/2016     Nutritional Supplements (NUTRITIONAL SUPPLEMENT PO) Take by mouth. Oceans Alive: Raw Phytoplankton     VITAMIN A PO Take by mouth.     VITAMIN E PO Take 1 tablet by mouth daily.      No current facility-administered medications on file prior to visit.    No Known Allergies  Social History   Socioeconomic History   Marital status: Single    Spouse name: Not on file   Number of children: Not on file   Years of education: Not on file   Highest education level: Not on file  Occupational History   Not on file  Tobacco Use   Smoking status: Every Day    Packs/day: 1.50    Types: Cigarettes   Smokeless tobacco: Current  Vaping Use   Vaping Use: Never used  Substance and Sexual Activity   Alcohol use: Yes    Alcohol/week: 0.0 standard drinks    Comment: occ.   Drug use: No   Sexual  activity: Yes    Partners: Male    Birth control/protection: Surgical, None, Condom    Comment: Hyst., INTERCOURSE AGE 47, SEXUAL PARTNERS MORE THAN 5  Other Topics Concern   Not on file  Social History Narrative   Not on file   Social Determinants of Health   Financial Resource Strain: Not on file  Food Insecurity: Not on file  Transportation Needs: Not on file  Physical Activity: Not on file  Stress: Not on file  Social Connections: Not on file  Intimate Partner Violence: Not on file    Family History  Problem Relation Age of Onset   Diabetes Mother    Hypertension Mother    Breast cancer Mother        Age Late 17's   Hypertension Maternal Grandfather     Past Surgical History:  Procedure Laterality Date   CARPAL TUNNEL RELEASE     CO2 LSAER OF VAGINA  07, 11   DIVERTICULUM OF THE BLADDER     OOPHORECTOMY     BSO   TOTAL ABDOMINAL HYSTERECTOMY  2004   BSO    ROS: Review of  Systems Negative except as stated above  PHYSICAL EXAM: LMP 01/23/2012   Physical Exam  {female adult master:310786} {female adult master:310785}  CMP Latest Ref Rng & Units 05/09/2021 07/31/2011 12/08/2010  Glucose 65 - 99 mg/dL 81 84 99  BUN 8 - 27 mg/dL 10 11 7   Creatinine 0.57 - 1.00 mg/dL 0.80 0.61 0.71  Sodium 134 - 144 mmol/L 141 140 139  Potassium 3.5 - 5.2 mmol/L 4.1 3.8 4.0  Chloride 96 - 106 mmol/L 101 102 103  CO2 20 - 29 mmol/L 22 31 28   Calcium 8.7 - 10.3 mg/dL 10.0 10.5 9.4  Total Protein 6.0 - 8.5 g/dL 7.5 7.4 7.2  Total Bilirubin 0.0 - 1.2 mg/dL 0.3 0.3 0.5  Alkaline Phos 44 - 121 IU/L 75 66 61  AST 0 - 40 IU/L 12 34 31  ALT 0 - 32 IU/L 12 32 31   Lipid Panel     Component Value Date/Time   CHOL 205 (H) 05/09/2021 1527   TRIG 124 05/09/2021 1527   HDL 54 05/09/2021 1527   CHOLHDL 3.8 05/09/2021 1527   LDLCALC 129 (H) 05/09/2021 1527    CBC    Component Value Date/Time   WBC 5.7 05/09/2021 1527   WBC 5.0 07/31/2011 1519   RBC 4.25 05/09/2021 1527   RBC 4.15 07/31/2011 1519   HGB 13.4 05/09/2021 1527   HGB 13.9 07/31/2011 1519   HCT 38.1 05/09/2021 1527   HCT 39.7 07/31/2011 1519   PLT 176 05/09/2021 1527   MCV 90 05/09/2021 1527   MCV 95.7 07/31/2011 1519   MCH 31.5 05/09/2021 1527   MCH 33.4 07/31/2011 1519   MCH 32.6 12/08/2010 1029   MCHC 35.2 05/09/2021 1527   MCHC 34.9 07/31/2011 1519   RDW 12.8 05/09/2021 1527   RDW 12.4 07/31/2011 1519   LYMPHSABS 2.5 07/31/2011 1519   MONOABS 0.4 07/31/2011 1519   EOSABS 0.1 07/31/2011 1519   BASOSABS 0.0 07/31/2011 1519    ASSESSMENT AND PLAN:  There are no diagnoses linked to this encounter.   Patient was given the opportunity to ask questions.  Patient verbalized understanding of the plan and was able to repeat key elements of the plan. Patient was given clear instructions to go to Emergency Department or return to medical center if symptoms don't improve, worsen, or new problems develop.The  patient verbalized understanding.   No orders of the defined types were placed in this encounter.    Requested Prescriptions    No prescriptions requested or ordered in this encounter    No follow-ups on file.  Camillia Herter, NP

## 2021-08-11 ENCOUNTER — Ambulatory Visit: Payer: Medicare Other | Admitting: Family

## 2021-08-11 DIAGNOSIS — I10 Essential (primary) hypertension: Secondary | ICD-10-CM

## 2021-08-21 NOTE — Progress Notes (Signed)
Patient ID: Brittany Duffy, female    DOB: Jul 11, 1956  MRN: [id number]  CC: Hypertension Follow-Up   Subjective: Brittany Duffy is a 65 y.o. female who presents for hypertension follow-up.   Her concerns today include:   HYPERTENSION FOLLOW-UP: 05/09/2021: - Continue Amlodipine and Losartan as prescribed.   08/27/2021: Doing well on current regimen. No side effects. No issues/concerns. Denies chest pain and shortness of breath.   2. ANXIETY DEPRESSION FOLLOW-UP: 04/08/2021: - Referral to Psychiatry for further evaluation and management of Alprazolam.   08/27/2021: Requesting Xanax refill. 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 Reports she did not receive a call from Psychiatry or that she may have missed it because her voicemail box was full. Denies thoughts of self-harm, suicidal ideations, and homicidal ideations.   Depression screen [facility] 2/9 08/27/2021 05/09/2021 04/08/2021  Decreased Interest 1 0 0  Down, Depressed, Hopeless 1 0 0  PHQ - 2 Score 2 0 0  Altered sleeping 3 0 0  Tired, decreased energy 0 0 0  Change in appetite 0 0 0  Feeling bad or failure about yourself  0 0 0  Trouble concentrating 0 0 0  Moving slowly or fidgety/restless 0 0 0  Suicidal thoughts 0 0 0  PHQ-9 Score 5 0 0  Difficult doing work/chores - Not difficult at all Not difficult at all    Patient Active Problem List   Diagnosis Date Noted   Prediabetes 05/10/2021   Hyperlipidemia 05/10/2021   History of total abdominal hysterectomy 08/27/2020   Hepatitis, viral 09/25/2015   Allergic urticaria 09/25/2015   Cigarette smoker one half pack a day or less 02/17/2013   Ovarian abscess    Vaginal intraepithelial neoplasia III (VAIN III)    Osteopenia      Current Outpatient Medications on File Prior to Visit  Medication Sig Dispense Refill   ALPRAZolam (XANAX) 0.5 MG tablet Take 0.5 mg by mouth.   1   Ascorbic Acid (VITAMIN C PO) Take 1 tablet by mouth daily.       atorvastatin (LIPITOR) 10 MG tablet Take 1 tablet (10 mg total) by mouth daily. 120 tablet 0   Cholecalciferol (VITAMIN D PO) Take by mouth.     Cyanocobalamin (VITAMIN B 12 PO) Take by mouth.     estradiol (CLIMARA) 0.025 mg/24hr patch Place 1 patch (0.025 mg total) onto the skin once a week. 12 patch 4   fish oil-omega-3 fatty acids 1000 MG capsule Take 2 g by mouth daily.       fluticasone (FLONASE) 50 MCG/ACT nasal spray Place 2 sprays into both nostrils daily. 16 g 0   gabapentin (NEURONTIN) 300 MG capsule Take 1 capsule by mouth.  1   GARLIC PO Take by mouth.     mometasone (ELOCON) 0.1 % cream Use once daily on affected skin as directed. 90 g 2   Multiple Vitamin (MULTIVITAMIN PO) Take 1 tablet by mouth daily. Reported on 04/28/2016     Nutritional Supplements (NUTRITIONAL SUPPLEMENT PO) Take by mouth. Oceans Alive: Raw Phytoplankton     VITAMIN A PO Take by mouth.     VITAMIN E PO Take 1 tablet by mouth daily.      CALCIUM PO Take 1 tablet by mouth daily.      cetirizine (ZYRTEC) 10 MG tablet Take 10 mg by mouth 2 (two) times daily.     lidocaine (XYLOCAINE) 5 % ointment  (Patient not taking: Reported on  08/27/2021)  3   MILK THISTLE PO Take by mouth. (Patient not taking: Reported on 08/27/2021)     montelukast (SINGULAIR) 10 MG tablet  (Patient not taking: Reported on 08/27/2021)  1   No current facility-administered medications on file prior to visit.    No Known Allergies  Social History   Socioeconomic History   Marital status: Single    Spouse name: Not on file   Number of children: Not on file   Years of education: Not on file   Highest education level: Not on file  Occupational History   Not on file  Tobacco Use   Smoking status: Every Day    Packs/day: 1.50    Types: Cigarettes   Smokeless tobacco: Current  Vaping Use   Vaping Use: Never used  Substance and Sexual Activity   Alcohol use: Yes    Alcohol/week: 0.0 standard drinks    Comment: occ.   Drug  use: No   Sexual activity: Yes    Partners: Male    Birth control/protection: Surgical, None, Condom    Comment: Hyst., INTERCOURSE AGE 21, SEXUAL PARTNERS MORE THAN 5  Other Topics Concern   Not on file  Social History Narrative   Not on file   Social Determinants of Health   Financial Resource Strain: Not on file  Food Insecurity: Not on file  Transportation Needs: Not on file  Physical Activity: Not on file  Stress: Not on file  Social Connections: Not on file  Intimate Partner Violence: Not on file    Family History  Problem Relation Age of Onset   Diabetes Mother    Hypertension Mother    Breast cancer Mother        Age Late 75's   Hypertension Maternal Grandfather     Past Surgical History:  Procedure Laterality Date   CARPAL TUNNEL RELEASE     CO2 LSAER OF VAGINA  07, 11   DIVERTICULUM OF THE BLADDER     OOPHORECTOMY     BSO   TOTAL ABDOMINAL HYSTERECTOMY  2004   BSO    ROS: Review of Systems Negative except as stated above  PHYSICAL EXAM: BP 135/87   Pulse 85   Resp 16   Wt 140 lb (63.5 kg)   LMP 01/23/2012   SpO2 96%   BMI 26.47 kg/m   Physical Exam HENT:     Head: Normocephalic and atraumatic.  Eyes:     Extraocular Movements: Extraocular movements intact.     Conjunctiva/sclera: Conjunctivae normal.     Pupils: Pupils are equal, round, and reactive to light.  Cardiovascular:     Rate and Rhythm: Normal rate and regular rhythm.     Pulses: Normal pulses.     Heart sounds: Normal heart sounds.  Pulmonary:     Effort: Pulmonary effort is normal.     Breath sounds: Normal breath sounds.  Musculoskeletal:     Cervical back: Normal range of motion and neck supple.  Neurological:     General: No focal deficit present.     Mental Status: She is alert and oriented to person, place, and time.  Psychiatric:        Mood and Affect: Mood normal.        Behavior: Behavior normal.   ASSESSMENT AND PLAN: 1. Essential hypertension: - Continue  Amlodipine and Losartan as prescribed.  - Counseled on blood pressure goal of less than 140/90, low-sodium, DASH diet, medication compliance, 150 minutes of moderate intensity  exercise per week as tolerated. Discussed medication compliance, adverse effects. - Follow-up with primary provider in 4 months or sooner if needed.  - amLODipine (NORVASC) 10 MG tablet; Take 1 tablet (10 mg total) by mouth daily.  Dispense: 120 tablet; Refill: 0 - losartan (COZAAR) 25 MG tablet; Take 1 tablet (25 mg total) by mouth daily. Needs OV in December for additional refills.  Dispense: 120 tablet; Refill: 0  2. Anxiety and depression: - Patient denies suicidal ideations, homicidal ideations, and thoughts of self-harm. - Referral to Psychiatry for further evaluation and management of Alprazolam.  - Follow-up with primary provider as scheduled.  - Ambulatory referral to Psychiatry  3. Need for immunization against influenza: - Administered today in office.  - Flu Vaccine QUAD 82mo+IM (Fluarix, Fluzone & Alfiuria Quad PF)  Patient was given the opportunity to ask questions.  Patient verbalized understanding of the plan and was able to repeat key elements of the plan. Patient was given clear instructions to go to Emergency Department or return to medical center if symptoms don't improve, worsen, or new problems develop.The patient verbalized understanding.   Orders Placed This Encounter  Procedures   Flu Vaccine QUAD 17mo+IM (Fluarix, Fluzone & Alfiuria Quad PF)   Ambulatory referral to Psychiatry    Requested Prescriptions   Signed Prescriptions Disp Refills   amLODipine (NORVASC) 10 MG tablet 120 tablet 0    Sig: Take 1 tablet (10 mg total) by mouth daily.   losartan (COZAAR) 25 MG tablet 120 tablet 0    Sig: Take 1 tablet (25 mg total) by mouth daily. Needs OV in December for additional refills.    Return in about 4 months (around 12/28/2021) for Follow-Up or next available hypertension .  Camillia Herter, NP

## 2021-08-26 ENCOUNTER — Other Ambulatory Visit: Payer: Self-pay | Admitting: *Deleted

## 2021-08-26 DIAGNOSIS — I10 Essential (primary) hypertension: Secondary | ICD-10-CM

## 2021-08-26 MED ORDER — LOSARTAN POTASSIUM 25 MG PO TABS: 25.0000 mg | ORAL_TABLET | Freq: Every day | ORAL | 0 refills | Status: DC

## 2021-08-27 ENCOUNTER — Encounter: Payer: Self-pay | Admitting: Family

## 2021-08-27 ENCOUNTER — Other Ambulatory Visit: Payer: Self-pay

## 2021-08-27 ENCOUNTER — Ambulatory Visit (INDEPENDENT_AMBULATORY_CARE_PROVIDER_SITE_OTHER): Payer: Medicare Other | Admitting: Family

## 2021-08-27 VITALS — BP 135/87 | HR 85 | Resp 16 | Wt 140.0 lb

## 2021-08-27 DIAGNOSIS — I10 Essential (primary) hypertension: Secondary | ICD-10-CM | POA: Diagnosis not present

## 2021-08-27 DIAGNOSIS — F419 Anxiety disorder, unspecified: Secondary | ICD-10-CM | POA: Diagnosis not present

## 2021-08-27 DIAGNOSIS — F32A Depression, unspecified: Secondary | ICD-10-CM

## 2021-08-27 DIAGNOSIS — Z23 Encounter for immunization: Secondary | ICD-10-CM | POA: Diagnosis not present

## 2021-08-27 MED ORDER — AMLODIPINE BESYLATE 10 MG PO TABS: 10.0000 mg | ORAL_TABLET | Freq: Every day | ORAL | 0 refills | Status: DC

## 2021-08-27 MED ORDER — LOSARTAN POTASSIUM 25 MG PO TABS: 25.0000 mg | ORAL_TABLET | Freq: Every day | ORAL | 0 refills | Status: DC

## 2021-08-27 NOTE — Patient Instructions (Signed)

## 2021-08-27 NOTE — Progress Notes (Signed)
F/u HTN Request for Xanax  Rx

## 2021-08-28 ENCOUNTER — Ambulatory Visit: Payer: Self-pay | Admitting: Nurse Practitioner

## 2021-09-01 ENCOUNTER — Other Ambulatory Visit: Payer: Self-pay | Admitting: *Deleted

## 2021-09-01 NOTE — Telephone Encounter (Signed)
Gabapentin (Neurontin) is showing up as a historical medication for the patient. Also, upon chart review I am unable to see the exact  reason patient was taking the same in the past.   If we can confirm with the patient the reason that she is currently taking Gabapentin (Neurontin) so that I can select appropriate diagnoses required before sending to pharmacy.

## 2021-09-03 ENCOUNTER — Other Ambulatory Visit: Payer: Self-pay | Admitting: *Deleted

## 2021-09-03 NOTE — Telephone Encounter (Signed)
Gabapentin (Neurontin) is showing up as a historical medication for the patient. Also, upon chart review I am unable to see the exact  reason patient was taking the same in the past.   If we can confirm with the patient the reason that she is currently taking Gabapentin (Neurontin) so that I can select appropriate diagnoses required before sending to pharmacy.

## 2021-09-09 ENCOUNTER — Ambulatory Visit: Payer: Medicare Other | Admitting: Nurse Practitioner

## 2021-09-09 ENCOUNTER — Ambulatory Visit (INDEPENDENT_AMBULATORY_CARE_PROVIDER_SITE_OTHER): Payer: Medicare Other | Admitting: Nurse Practitioner

## 2021-09-09 ENCOUNTER — Encounter: Payer: Self-pay | Admitting: Nurse Practitioner

## 2021-09-09 ENCOUNTER — Other Ambulatory Visit: Payer: Self-pay

## 2021-09-09 VITALS — BP 124/84 | Ht 60.0 in | Wt 141.0 lb

## 2021-09-09 DIAGNOSIS — Z01419 Encounter for gynecological examination (general) (routine) without abnormal findings: Secondary | ICD-10-CM | POA: Diagnosis not present

## 2021-09-09 DIAGNOSIS — Z79890 Hormone replacement therapy (postmenopausal): Secondary | ICD-10-CM

## 2021-09-09 DIAGNOSIS — Z780 Asymptomatic menopausal state: Secondary | ICD-10-CM

## 2021-09-09 DIAGNOSIS — Z9189 Other specified personal risk factors, not elsewhere classified: Secondary | ICD-10-CM | POA: Diagnosis not present

## 2021-09-09 DIAGNOSIS — M8589 Other specified disorders of bone density and structure, multiple sites: Secondary | ICD-10-CM

## 2021-09-09 NOTE — Progress Notes (Signed)
   Brittany Duffy Mar 23, 1956 [id number]   History:  65 y.o. G2P1011 presents for breast and pelvic exam. No GYN complaints. 2004 TAH BSO for fibroids. Was using Climarar 0.025 mg 1/2 patch weekly but ran out and is doing fine without. 2011 VAIN 3 with CO2 laser, cryosurgery for CIN-1 years ago, subsequent paps normal. Osteopenia. Normal mammogram history.   Gynecologic History Patient's last menstrual period was 01/23/2012.   Contraception: status post hysterectomy  Health maintenance Last Pap: 08/29/2020. Results were: Normal Last mammogram: 01/26/2020. Results were: normal Last colonoscopy: around 10 years per patient Results were: benign polyps Last Dexa: 10/16/2020. Results were: T-score -1.8, FRAX 4.6% / 1.0%  Past medical history, past surgical history, family history and social history were all reviewed and documented in the EPIC chart. Retired. Son. Mother diagnosed with breast cancer in late 68s.   ROS:  A ROS was performed and pertinent positives and negatives are included.  Exam:  Vitals:   09/09/21 1436  BP: 124/84  Weight: 141 lb (64 kg)  Height: 5' (1.524 m)    Body mass index is 27.54 kg/m.  General appearance:  Normal Thyroid:  Symmetrical, normal in size, without palpable masses or nodularity. Respiratory  Auscultation:  Clear without wheezing or rhonchi Cardiovascular  Auscultation:  Regular rate, without rubs, murmurs or gallops  Edema/varicosities:  Not grossly evident Abdominal  Soft,nontender, without masses, guarding or rebound.  Liver/spleen:  No organomegaly noted  Hernia:  None appreciated  Skin  Inspection:  Grossly normal   Breasts: Examined lying and sitting.   Right: Without masses, retractions, discharge or axillary adenopathy.   Left: Without masses, retractions, discharge or axillary adenopathy. Gentitourinary   Inguinal/mons:  Normal without inguinal adenopathy  External genitalia:  Normal  BUS/Urethra/Skene's glands:   Normal  Vagina:  Normal  Cervix:  Absent  Uterus:  Absent  Adnexa/parametria:     Rt: Without masses or tenderness.   Lt: Without masses or tenderness.  Anus and perineum: Normal  Digital rectal exam: Normal sphincter tone without palpated masses or tenderness  Assessment/Plan:  65 y.o. G2P1011 for breast and pelvic exam.   Well female exam with routine gynecological exam - Education provided on SBEs, importance of preventative screenings, current guidelines, high calcium diet, regular exercise, and multivitamin daily.  Labs with PCP.   Postmenopausal - ran out of Climara patches and has been doing fine. She was asking to restart. We discussed risk of continued use and mild symptoms and she is agreeable to remain off of them.   Osteopenia of multiple sites - T-score -1.8. Continue Vitamin D and Calcium supplements and increase exercise. Will repeat DXA at 2-year interval per recommendation.   Screening for cervical cancer - 2011 VAIN 3 with laser Co2, cryosurgery many years ago for CIN-1 subsequent paps normal.  Will repeat vaginal pap at 3-year interval.   Screening for breast cancer - Normal mammogram history.  Overdue and encouraged to schedule soon. Normal breast exam today.  Screening for colon cancer - Believes her last colonoscopy was about 10 years ago. Recommend calling GI to find out when she is due.  Follow up in 1 year for annual.      Tamela Gammon [facility], 3:27 PM 09/09/2021

## 2021-09-09 NOTE — Progress Notes (Deleted)
   Brittany Duffy 13-Nov-1955 [id number]   History:  65 y.o. G2P1011 presents for breast and pelvic exam. 2004 TAH BSO for fibroids on HRT. She has decreased her Climara patch to half a patch weekly. 2011 VAIN 3 with CO2 laser, cryosurgery for CIN-1 years ago, subsequent paps normal. Osteopenia. Normal mammogram history.   Gynecologic History Patient's last menstrual period was 01/23/2012.   Contraception: status post hysterectomy  Health maintenance Last Pap: 08/29/2020. Results were: Normal Last mammogram: 01/26/2020. Results were: normal Last colonoscopy: patient unsure of year. Results were: benign polyps Last Dexa: 10/16/2020. Results were: T-score -1.8, FRAX 4.6% / 1.0%  Past medical history, past surgical history, family history and social history were all reviewed and documented in the EPIC chart. Retired. Son. Mother diagnosed with breast cancer in late 63s.   ROS:  A ROS was performed and pertinent positives and negatives are included.  Exam:  There were no vitals filed for this visit.  There is no height or weight on file to calculate BMI.  General appearance:  Normal Thyroid:  Symmetrical, normal in size, without palpable masses or nodularity. Respiratory  Auscultation:  Clear without wheezing or rhonchi Cardiovascular  Auscultation:  Regular rate, without rubs, murmurs or gallops  Edema/varicosities:  Not grossly evident Abdominal  Soft,nontender, without masses, guarding or rebound.  Liver/spleen:  No organomegaly noted  Hernia:  None appreciated  Skin  Inspection:  Grossly normal   Breasts: Examined lying and sitting.   Right: Without masses, retractions, discharge or axillary adenopathy.   Left: Without masses, retractions, discharge or axillary adenopathy. Gentitourinary   Inguinal/mons:  Normal without inguinal adenopathy  External genitalia:  Normal  BUS/Urethra/Skene's glands:  Normal  Vagina:  Normal  Cervix:  Absent  Uterus:   Absent  Adnexa/parametria:     Rt: Without masses or tenderness.   Lt: Without masses or tenderness.  Anus and perineum: Normal  Digital rectal exam: Normal sphincter tone without palpated masses or tenderness  Assessment/Plan:  65 y.o. G2P1011 for breast and pelvic exam.    Screening for cervical cancer - 2011 VAIN 3, subsequent paps normal.  No longer screening per guidelines.   Screening for breast cancer - Normal mammogram history.  Continue annual screenings.  Normal breast exam today.  Screening for colon cancer - Will repeat at GI's recommended interval.   Follow up in 1 year for annual.      Tamela Gammon [facility], 12:09 PM 09/09/2021

## 2021-12-03 ENCOUNTER — Other Ambulatory Visit: Payer: Self-pay | Admitting: Family

## 2021-12-03 DIAGNOSIS — I10 Essential (primary) hypertension: Secondary | ICD-10-CM

## 2021-12-23 ENCOUNTER — Other Ambulatory Visit: Payer: Self-pay

## 2021-12-23 ENCOUNTER — Encounter: Payer: Self-pay | Admitting: Family

## 2021-12-23 ENCOUNTER — Ambulatory Visit (INDEPENDENT_AMBULATORY_CARE_PROVIDER_SITE_OTHER): Payer: Medicare Other | Admitting: Family

## 2021-12-23 VITALS — BP 132/74 | HR 79 | Temp 98.3°F | Resp 18 | Ht 60.0 in | Wt 145.0 lb

## 2021-12-23 DIAGNOSIS — Z113 Encounter for screening for infections with a predominantly sexual mode of transmission: Secondary | ICD-10-CM

## 2021-12-23 DIAGNOSIS — I10 Essential (primary) hypertension: Secondary | ICD-10-CM

## 2021-12-23 DIAGNOSIS — M792 Neuralgia and neuritis, unspecified: Secondary | ICD-10-CM

## 2021-12-23 DIAGNOSIS — Z1329 Encounter for screening for other suspected endocrine disorder: Secondary | ICD-10-CM

## 2021-12-23 DIAGNOSIS — F17210 Nicotine dependence, cigarettes, uncomplicated: Secondary | ICD-10-CM

## 2021-12-23 DIAGNOSIS — Z0001 Encounter for general adult medical examination with abnormal findings: Secondary | ICD-10-CM | POA: Diagnosis not present

## 2021-12-23 DIAGNOSIS — Z124 Encounter for screening for malignant neoplasm of cervix: Secondary | ICD-10-CM

## 2021-12-23 DIAGNOSIS — F419 Anxiety disorder, unspecified: Secondary | ICD-10-CM

## 2021-12-23 DIAGNOSIS — Z1231 Encounter for screening mammogram for malignant neoplasm of breast: Secondary | ICD-10-CM

## 2021-12-23 DIAGNOSIS — Z130 Encounter for screening for diseases of the blood and blood-forming organs and certain disorders involving the immune mechanism: Secondary | ICD-10-CM

## 2021-12-23 DIAGNOSIS — E785 Hyperlipidemia, unspecified: Secondary | ICD-10-CM

## 2021-12-23 DIAGNOSIS — R7303 Prediabetes: Secondary | ICD-10-CM | POA: Diagnosis not present

## 2021-12-23 DIAGNOSIS — Z0000 Encounter for general adult medical examination without abnormal findings: Secondary | ICD-10-CM

## 2021-12-23 DIAGNOSIS — Z1211 Encounter for screening for malignant neoplasm of colon: Secondary | ICD-10-CM

## 2021-12-23 DIAGNOSIS — Z13228 Encounter for screening for other metabolic disorders: Secondary | ICD-10-CM

## 2021-12-23 DIAGNOSIS — Z720 Tobacco use: Secondary | ICD-10-CM

## 2021-12-23 DIAGNOSIS — Z87891 Personal history of nicotine dependence: Secondary | ICD-10-CM

## 2021-12-23 DIAGNOSIS — F32A Depression, unspecified: Secondary | ICD-10-CM

## 2021-12-23 DIAGNOSIS — Z122 Encounter for screening for malignant neoplasm of respiratory organs: Secondary | ICD-10-CM

## 2021-12-23 MED ORDER — LOSARTAN POTASSIUM 25 MG PO TABS: 25.0000 mg | ORAL_TABLET | Freq: Every day | ORAL | 0 refills | Status: DC

## 2021-12-23 MED ORDER — ATORVASTATIN CALCIUM 10 MG PO TABS: 10.0000 mg | ORAL_TABLET | Freq: Every day | ORAL | 0 refills | Status: DC

## 2021-12-23 MED ORDER — AMLODIPINE BESYLATE 10 MG PO TABS: 10.0000 mg | ORAL_TABLET | Freq: Every day | ORAL | 0 refills | Status: DC

## 2021-12-23 MED ORDER — GABAPENTIN 300 MG PO CAPS: 300.0000 mg | ORAL_CAPSULE | Freq: Two times a day (BID) | ORAL | 2 refills | Status: DC

## 2021-12-23 NOTE — Progress Notes (Signed)
Subjective:   Brittany Duffy is a 66 y.o. female who presents for Medicare Annual (Subsequent) preventive examination.  Review of Systems:   No issues/concerns today.   HYPERTENSION FOLLOW-UP: 08/27/2021: - Continue Amlodipine and Losartan as prescribed.   12/23/2021: Not checking blood pressures at home. Doing well on current regimen. No side effects. No issues/concerns. Denies chest pain and shortness of breath.   2. HYPERLIPIDEMIA FOLLOW-UP: Reports taking Atorvastatin most of the time.  3. NEUROPATHIC PAIN: Began after having Shingles years ago. Taking Gabapentin, doing well no issues/concerns.     Objective:    Vitals with BMI 12/23/2021 09/09/2021 08/27/2021  Height _0  _1  -  Weight 145 lbs 141 lbs -  BMI 35.68 61.68 -  Systolic 372 902 111  Diastolic 74 84 87  Pulse 79 - -    Advanced Directives 11/11/2015  Does Patient Have a Medical Advance Directive? No   Current Medications (verified) Outpatient Encounter Medications as of 12/23/2021  Medication Sig   ALPRAZolam (XANAX) 0.5 MG tablet Take 0.5 mg by mouth.    amLODipine (NORVASC) 10 MG tablet Take 1 tablet (10 mg total) by mouth daily.   Ascorbic Acid (VITAMIN C PO) Take 1 tablet by mouth daily.    atorvastatin (LIPITOR) 10 MG tablet Take 1 tablet (10 mg total) by mouth daily.   CALCIUM PO Take 1 tablet by mouth daily.    cetirizine (ZYRTEC) 10 MG tablet Take 10 mg by mouth 2 (two) times daily.   Cholecalciferol (VITAMIN D PO) Take by mouth.   Cyanocobalamin (VITAMIN B 12 PO) Take by mouth.   fish oil-omega-3 fatty acids 1000 MG capsule Take 2 g by mouth daily.     fluticasone (FLONASE) 50 MCG/ACT nasal spray Place 2 sprays into both nostrils daily.   gabapentin (NEURONTIN) 300 MG capsule Take 1 capsule by mouth.   GARLIC PO Take by mouth.   lidocaine (XYLOCAINE) 5 % ointment  (Patient not taking: Reported on 09/09/2021)   losartan (COZAAR) 25 MG tablet TAKE 1 TABLET(25 MG) BY MOUTH DAILY   MILK  THISTLE PO Take by mouth.   mometasone (ELOCON) 0.1 % cream Use once daily on affected skin as directed. (Patient not taking: Reported on 09/09/2021)   montelukast (SINGULAIR) 10 MG tablet    Multiple Vitamin (MULTIVITAMIN PO) Take 1 tablet by mouth daily. Reported on 04/28/2016   Nutritional Supplements (NUTRITIONAL SUPPLEMENT PO) Take by mouth. Oceans Alive: Raw Phytoplankton   VITAMIN A PO Take by mouth.   VITAMIN E PO Take 1 tablet by mouth daily.    No facility-administered encounter medications on file as of 12/23/2021.    Allergies (verified) Patient has no known allergies.   History: Past Medical History:  Diagnosis Date   Bronchitis    Dysplasia of cervix, low grade (CIN 1)    S/P CRYO   Fibroid    Hepatitis C    Hypertension    Osteopenia 08/2018   T score -1.9 FRAX 4.7% / 1%   Vaginal intraepithelial neoplasia III (VAIN III)    Past Surgical History:  Procedure Laterality Date   CARPAL TUNNEL RELEASE     CO2 LSAER OF VAGINA  07, 70   DIVERTICULUM OF THE BLADDER     OOPHORECTOMY     BSO   TOTAL ABDOMINAL HYSTERECTOMY  2004   BSO   Family History  Problem Relation Age of Onset   Diabetes Mother    Hypertension Mother    Breast  cancer Mother        Age Late 19's   Hypertension Maternal Grandfather    Social History   Socioeconomic History   Marital status: Single    Spouse name: Not on file   Number of children: Not on file   Years of education: Not on file   Highest education level: Not on file  Occupational History   Not on file  Tobacco Use   Smoking status: Every Day    Packs/day: 1.50    Types: Cigarettes   Smokeless tobacco: Current  Vaping Use   Vaping Use: Never used  Substance and Sexual Activity   Alcohol use: Yes    Alcohol/week: 0.0 standard drinks    Comment: occ.   Drug use: No   Sexual activity: Yes    Partners: Male    Birth control/protection: Surgical    Comment: Hyst., INTERCOURSE AGE 31, SEXUAL PARTNERS MORE THAN 5  Other  Topics Concern   Not on file  Social History Narrative   Not on file   Social Determinants of Health   Financial Resource Strain: Not on file  Food Insecurity: Not on file  Transportation Needs: Not on file  Physical Activity: Not on file  Stress: Not on file  Social Connections: Not on file    Tobacco Counseling Ready to quit: Not Answered Counseling given: Not Answered   Clinical Intake: Pre-visit preparation completed: Yes Pain : 0-10 Pain Score: 0-No pain Diabetes: No Interpreter Needed?: No    Activities of Daily Living In your present state of health, do you have any difficulty performing the following activities: 04/08/2021  Hearing? N  Vision? N  Difficulty concentrating or making decisions? N  Walking or climbing stairs? N  Dressing or bathing? N  Doing errands, shopping? N  Some recent data might be hidden   Patient Care Team: Camillia Herter, NP as PCP - General (Family Medicine)  Indicate any recent Medical Services you may have received from other than [facility] providers in the past year (date may be approximate). N/A    Assessment:   This is a routine wellness examination for Brittany Duffy.  Hearing/Vision screen No results found.  Dietary issues and exercise activities discussed: yes    Goals Addressed: Would like to quit smoking but doesn't think its possible. Smoking for at least 40 years. Currently smoking 0.5 pack daily or a little more. Hasn't tried any medication to help quit. Not ready to begin medication presently.    Depression screen [facility] 2/9 12/23/2021 08/27/2021 05/09/2021 04/08/2021  Decreased Interest 1 1 0 0  Down, Depressed, Hopeless 0 1 0 0  PHQ - 2 Score 1 2 0 0  Altered sleeping - 3 0 0  Tired, decreased energy - 0 0 0  Change in appetite - 0 0 0  Feeling bad or failure about yourself  - 0 0 0  Trouble concentrating - 0 0 0  Moving slowly or fidgety/restless - 0 0 0  Suicidal thoughts - 0 0 0  PHQ-9 Score - 5 0 0  Difficult doing  work/chores - - Not difficult at all Not difficult at all    Fall Risk Fall Risk  08/27/2021 04/08/2021  Falls in the past year? 0 0  Number falls in past yr: 0 0  Injury with Fall? 0 0  Risk for fall due to : No Fall Risks No Fall Risks  Follow up Falls evaluation completed Falls evaluation completed    [place]  PERTAINING TO THE HOME: Any stairs in or around the home? No  If so, are there any without handrails? N/A Home free of loose throw rugs in walkways, pet beds, electrical cords, etc? Yes Adequate lighting in your home to reduce risk of falls? Yes  ASSISTIVE DEVICES UTILIZED TO PREVENT FALLS: Life alert? No  Use of a cane, walker or w/c? No  Grab bars in the bathroom? No  Shower chair or bench in shower? No  Elevated toilet seat or a handicapped toilet? No   TIMED UP AND GO: Was the test performed? Yes.  Length of time to ambulate 10 feet: 10 seconds  Gait steady and fast without use of assistive device  Immunizations Immunization History  Administered Date(s) Administered   Influenza,inj,Quad PF,6+ Mos 09/14/2016, 09/29/2018, 08/27/2020, 08/27/2021   Tdap 11/12/2015    TDAP status: Up to date  Flu Vaccine status: Up to date  Pneumococcal vaccine status: Up to date  Covid-19 vaccine status: Completed vaccines  Qualifies for Shingles Vaccine? Yes, completed at external office   Screening Tests Health Maintenance  Topic Date Due   COVID-19 Vaccine (1) Never done   Pneumonia Vaccine 58+ Years old (1 - PCV) Never done   COLONOSCOPY (Pts 45-40yr Insurance coverage will need to be confirmed)  Never done   Zoster Vaccines- Shingrix (1 of 2) Never done   PAP SMEAR-Modifier  08/27/2021   MAMMOGRAM  01/24/2022   TETANUS/TDAP  11/11/2025   INFLUENZA VACCINE  Completed   DEXA SCAN  Completed   Hepatitis C Screening  Completed   HPV VACCINES  Aged Out    Health Maintenance  Health Maintenance Due  Topic Date Due   COVID-19 Vaccine (1) Never  done   Pneumonia Vaccine 68 Years old (1 - PCV) Never done   COLONOSCOPY (Pts 45-461yrInsurance coverage will need to be confirmed)  Never done   Zoster Vaccines- Shingrix (1 of 2) Never done   PAP SMEAR-Modifier  08/27/2021    Colorectal cancer screening: Referral to GI placed 12/23/21. Pt aware the office will call re: appt.  Mammogram status: Completed 01/25/20. Repeat every year  Bone Density status: Completed 11/05/2020. Results reflect: Bone density results: OSTEOPENIA. Repeat every 2 years.  Lung Cancer Screening: (Low Dose CT Chest recommended if Age 68-80ears, 30 pack-year currently smoking OR have quit w/in 15years.) does qualify.   Lung Cancer Screening Referral: 12/23/2021  Additional Screening:  Hepatitis C Screening: does not qualify; Completed 09/15/2016  Vision Screening: Recommended annual ophthalmology exams for early detection of glaucoma and other disorders of the eye. Is the patient up to date with their annual eye exam?  Yes  Who is the provider or what is the name of the office in which the patient attends annual eye exams? [facility]  If pt is not established with a provider, would they like to be referred to a provider to establish care? N/A.   Dental Screening: Recommended annual dental exams for proper oral hygiene  Community Resource Referral / Chronic Care Management: CRR required this visit?  No   CCM required this visit?  No      Plan:  1. Medicare annual wellness visit, subsequent: - Counseled on 150 minutes of exercise per week as tolerated, healthy eating (including decreased daily intake of saturated fats, cholesterol, added sugars, sodium), STI prevention, and routine healthcare maintenance.  2. Screening for metabolic disorder: - [id number] check kidney function, liver function, and electrolyte balance.  - CMP14+EGFR  3. Screening for deficiency anemia: - CBC to screen for anemia. - CBC  4. Thyroid disorder screen: -  TSH to check thyroid function.  - TSH  5. Essential (primary) hypertension: - Continue Amlodipine and Losartan as prescribed.  - Counseled on blood pressure goal of less than 140/90, low-sodium, DASH diet, medication compliance, 150 minutes of moderate intensity exercise per week as tolerated. Discussed medication compliance, adverse effects. - Follow-up with primary provider in 4 months or sooner if needed. - amLODipine (NORVASC) 10 MG tablet; Take 1 tablet (10 mg total) by mouth daily.  Dispense: 120 tablet; Refill: 0 - losartan (COZAAR) 25 MG tablet; Take 1 tablet (25 mg total) by mouth daily.  Dispense: 120 tablet; Refill: 0  6. Hyperlipidemia, unspecified hyperlipidemia type: - Continue Atorvastatin as prescribed.  - Follow-up with primary provider as scheduled.  - atorvastatin (LIPITOR) 10 MG tablet; Take 1 tablet (10 mg total) by mouth daily.  Dispense: 120 tablet; Refill: 0  7. Prediabetes: - Update screening today.  - Hemoglobin A1c  8. Anxiety and depression: - Patient denies thoughts of self-harm, suicidal ideations, homicidal ideations. - Referral to Psychiatry for further evaluation and management.  - Ambulatory referral to Psychiatry  9. Encounter for screening mammogram for malignant neoplasm of breast: - Referral for breast cancer screening by mammogram.  - MM Digital Screening; Future  10. Pap smear for cervical cancer screening: 11. Routine screening for STI (sexually transmitted infection): - Patient declined.   12. Colon cancer screening: - Referral to Gastroenterology for colon cancer screening by colonoscopy. - Ambulatory referral to Gastroenterology  13. Screening for lung cancer: 17. Stopped smoking with greater than 40 pack year history: 15. Declined smoking cessation: - CT chest for lung cancer screening.  - CT CHEST LUNG CA SCREEN LOW DOSE W/O CM; Future  16. Neuropathic pain: - Continue Gabapentin as prescribed.  - Follow-up with primary  provider as scheduled. - gabapentin (NEURONTIN) 300 MG capsule; Take 1 capsule (300 mg total) by mouth 2 (two) times daily.  Dispense: 60 capsule; Refill: 2   I have personally reviewed and noted the following in the patients chart:   Medical and social history Use of alcohol, tobacco or illicit drugs  Current medications and supplements including opioid prescriptions.  Functional ability and status Nutritional status Physical activity Advanced directives List of other physicians Hospitalizations, surgeries, and ER visits in previous 12 months Vitals Screenings to include cognitive, depression, and falls Referrals and appointments  In addition, I have reviewed and discussed with patient certain preventive protocols, quality metrics, and best practice recommendations. A written personalized care plan for preventive services as well as general preventive health recommendations were provided to patient.    Durene Fruits, NP  12/23/2021

## 2021-12-23 NOTE — Progress Notes (Signed)
Pt presents for medicare annual physical, pt needs gabapentin refill

## 2021-12-23 NOTE — Progress Notes (Signed)
Subjective:   Brittany Duffy is a 66 y.o. female who presents for Medicare Annual (Subsequent) preventive examination.  Review of Systems           Objective:    Today's Vitals   12/23/21 1401  PainSc: 0-No pain   There is no height or weight on file to calculate BMI.  Advanced Directives 11/11/2015  Does Patient Have a Medical Advance Directive? No    Current Medications (verified) Outpatient Encounter Medications as of 12/23/2021  Medication Sig   ALPRAZolam (XANAX) 0.5 MG tablet Take 0.5 mg by mouth.    amLODipine (NORVASC) 10 MG tablet Take 1 tablet (10 mg total) by mouth daily.   Ascorbic Acid (VITAMIN C PO) Take 1 tablet by mouth daily.    atorvastatin (LIPITOR) 10 MG tablet Take 1 tablet (10 mg total) by mouth daily.   CALCIUM PO Take 1 tablet by mouth daily.    cetirizine (ZYRTEC) 10 MG tablet Take 10 mg by mouth 2 (two) times daily.   Cholecalciferol (VITAMIN D PO) Take by mouth.   Cyanocobalamin (VITAMIN B 12 PO) Take by mouth.   fish oil-omega-3 fatty acids 1000 MG capsule Take 2 g by mouth daily.     fluticasone (FLONASE) 50 MCG/ACT nasal spray Place 2 sprays into both nostrils daily.   gabapentin (NEURONTIN) 300 MG capsule Take 1 capsule by mouth.   GARLIC PO Take by mouth.   lidocaine (XYLOCAINE) 5 % ointment  (Patient not taking: Reported on 09/09/2021)   losartan (COZAAR) 25 MG tablet TAKE 1 TABLET(25 MG) BY MOUTH DAILY   MILK THISTLE PO Take by mouth.   mometasone (ELOCON) 0.1 % cream Use once daily on affected skin as directed. (Patient not taking: Reported on 09/09/2021)   montelukast (SINGULAIR) 10 MG tablet    Multiple Vitamin (MULTIVITAMIN PO) Take 1 tablet by mouth daily. Reported on 04/28/2016   Nutritional Supplements (NUTRITIONAL SUPPLEMENT PO) Take by mouth. Oceans Alive: Raw Phytoplankton   VITAMIN A PO Take by mouth.   VITAMIN E PO Take 1 tablet by mouth daily.    No facility-administered encounter medications on file as of 12/23/2021.     Allergies (verified) Patient has no known allergies.   History: Past Medical History:  Diagnosis Date   Bronchitis    Dysplasia of cervix, low grade (CIN 1)    S/P CRYO   Fibroid    Hepatitis C    Hypertension    Osteopenia 08/2018   T score -1.9 FRAX 4.7% / 1%   Vaginal intraepithelial neoplasia III (VAIN III)    Past Surgical History:  Procedure Laterality Date   CARPAL TUNNEL RELEASE     CO2 LSAER OF VAGINA  07, 11   DIVERTICULUM OF THE BLADDER     OOPHORECTOMY     BSO   TOTAL ABDOMINAL HYSTERECTOMY  2004   BSO   Family History  Problem Relation Age of Onset   Diabetes Mother    Hypertension Mother    Breast cancer Mother        Age Late 76's   Hypertension Maternal Grandfather    Social History   Socioeconomic History   Marital status: Single    Spouse name: Not on file   Number of children: Not on file   Years of education: Not on file   Highest education level: Not on file  Occupational History   Not on file  Tobacco Use   Smoking status: Every Day  Packs/day: 1.50    Types: Cigarettes   Smokeless tobacco: Current  Vaping Use   Vaping Use: Never used  Substance and Sexual Activity   Alcohol use: Yes    Alcohol/week: 0.0 standard drinks    Comment: occ.   Drug use: No   Sexual activity: Yes    Partners: Male    Birth control/protection: Surgical    Comment: Hyst., INTERCOURSE AGE 2, SEXUAL PARTNERS MORE THAN 5  Other Topics Concern   Not on file  Social History Narrative   Not on file   Social Determinants of Health   Financial Resource Strain: Not on file  Food Insecurity: Not on file  Transportation Needs: Not on file  Physical Activity: Not on file  Stress: Not on file  Social Connections: Not on file    Tobacco Counseling Ready to quit: Not Answered Counseling given: Not Answered   Clinical Intake:  Pre-visit preparation completed: Yes  Pain : 0-10 Pain Score: 0-No pain     Diabetes: No     Interpreter  Needed?: No      Activities of Daily Living In your present state of health, do you have any difficulty performing the following activities: 04/08/2021  Hearing? N  Vision? N  Difficulty concentrating or making decisions? N  Walking or climbing stairs? N  Dressing or bathing? N  Doing errands, shopping? N  Some recent data might be hidden    Patient Care Team: Camillia Herter, NP as PCP - General (Nurse Practitioner)  Indicate any recent Medical Services you may have received from other than [facility] providers in the past year (date may be approximate).     Assessment:   This is a routine wellness examination for Brittany Duffy.  Hearing/Vision screen No results found.  Dietary issues and exercise activities discussed:     Goals Addressed   None    Depression Screen PHQ 2/9 Scores 08/27/2021 05/09/2021 04/08/2021  PHQ - 2 Score 2 0 0  PHQ- 9 Score 5 0 0    Fall Risk Fall Risk  08/27/2021 04/08/2021  Falls in the past year? 0 0  Number falls in past yr: 0 0  Injury with Fall? 0 0  Risk for fall due to : No Fall Risks No Fall Risks  Follow up Falls evaluation completed Falls evaluation completed    [place]:  Any stairs in or around the home? No  If so, are there any without handrails? No  Home free of loose throw rugs in walkways, pet beds, electrical cords, etc? No  Adequate lighting in your home to reduce risk of falls? No   ASSISTIVE DEVICES UTILIZED TO PREVENT FALLS:  Life alert? No  Use of a cane, walker or w/c? No  Grab bars in the bathroom? No  Shower chair or bench in shower? No  Elevated toilet seat or a handicapped toilet? No   TIMED UP AND GO:  Was the test performed? No .  Length of time to ambulate 10 feet: N/A sec.   Gait steady and fast without use of assistive device  Cognitive Function:        Immunizations Immunization History  Administered Date(s) Administered   Influenza,inj,Quad PF,6+ Mos  09/14/2016, 09/29/2018, 08/27/2020, 08/27/2021   Tdap 11/12/2015    TDAP status: Up to date  Flu Vaccine status: Up to date  Pneumococcal vaccine status: Up to date  Covid-19 vaccine status: Completed vaccines  Qualifies for Shingles Vaccine? Yes  Zostavax completed Yes   Shingrix Completed?: Yes  Screening Tests Health Maintenance  Topic Date Due   COVID-19 Vaccine (1) Never done   Pneumonia Vaccine 59+ Years old (1 - PCV) Never done   COLONOSCOPY (Pts 45-52yrs Insurance coverage will need to be confirmed)  Never done   Zoster Vaccines- Shingrix (1 of 2) Never done   PAP SMEAR-Modifier  08/27/2021   MAMMOGRAM  01/24/2022   TETANUS/TDAP  11/11/2025   INFLUENZA VACCINE  Completed   DEXA SCAN  Completed   Hepatitis C Screening  Completed   HPV VACCINES  Aged Out    Health Maintenance  Health Maintenance Due  Topic Date Due   COVID-19 Vaccine (1) Never done   Pneumonia Vaccine 4+ Years old (1 - PCV) Never done   COLONOSCOPY (Pts 45-29yrs Insurance coverage will need to be confirmed)  Never done   Zoster Vaccines- Shingrix (1 of 2) Never done   PAP SMEAR-Modifier  08/27/2021    Colorectal cancer screening: Referral to GI placed 12/23/21. Pt aware the office will call re: appt.  Mammogram status: Completed 01/25/20. Repeat every year  Bone Density status: Completed 11/05/2020. Results reflect: Bone density results: OSTEOPENIA. Repeat every 2 years.  Lung Cancer Screening: (Low Dose CT Chest recommended if Age 73-80 years, 30 pack-year currently smoking OR have quit w/in 15years.) does qualify.   Lung Cancer Screening Referral: 12/23/21  Additional Screening:  Hepatitis C Screening: does not qualify; Completed 09/15/2016  Vision Screening: Recommended annual ophthalmology exams for early detection of glaucoma and other disorders of the eye. Is the patient up to date with their annual eye exam?  Yes  Who is the provider or what is the name of the office in which  the patient attends annual eye exams? Dr Benjaman Lobe  If pt is not established with a provider, would they like to be referred to a provider to establish care?  N/a .   Dental Screening: Recommended annual dental exams for proper oral hygiene  Community Resource Referral / Chronic Care Management: CRR required this visit?  No   CCM required this visit?  No      Plan:     I have personally reviewed and noted the following in the patients chart:   Medical and social history Use of alcohol, tobacco or illicit drugs  Current medications and supplements including opioid prescriptions.  Functional ability and status Nutritional status Physical activity Advanced directives List of other physicians Hospitalizations, surgeries, and ER visits in previous 12 months Vitals Screenings to include cognitive, depression, and falls Referrals and appointments  In addition, I have reviewed and discussed with patient certain preventive protocols, quality metrics, and best practice recommendations. A written personalized care plan for preventive services as well as general preventive health recommendations were provided to patient.     Elmon Else, [facility]   12/23/2021

## 2021-12-24 LAB — CBC
Hematocrit: 39.4 % (ref 34.0–46.6)
Hemoglobin: 13.7 g/dL (ref 11.1–15.9)
MCH: 31.5 pg (ref 26.6–33.0)
MCHC: 34.8 g/dL (ref 31.5–35.7)
MCV: 91 fL (ref 79–97)
Platelets: 173 10*3/uL (ref 150–450)
RBC: 4.35 x10E6/uL (ref 3.77–5.28)
RDW: 12.7 % (ref 11.7–15.4)
WBC: 5.1 10*3/uL (ref 3.4–10.8)

## 2021-12-24 LAB — CMP14+EGFR
ALT: 10 IU/L (ref 0–32)
AST: 15 IU/L (ref 0–40)
Albumin/Globulin Ratio: 1.8 (ref 1.2–2.2)
Albumin: 5 g/dL — ABNORMAL HIGH (ref 3.8–4.8)
Alkaline Phosphatase: 81 IU/L (ref 44–121)
BUN/Creatinine Ratio: 13 (ref 12–28)
BUN: 10 mg/dL (ref 8–27)
Bilirubin Total: 0.3 mg/dL (ref 0.0–1.2)
CO2: 23 mmol/L (ref 20–29)
Calcium: 10.2 mg/dL (ref 8.7–10.3)
Chloride: 104 mmol/L (ref 96–106)
Creatinine, Ser: 0.78 mg/dL (ref 0.57–1.00)
Globulin, Total: 2.8 g/dL (ref 1.5–4.5)
Glucose: 84 mg/dL (ref 70–99)
Potassium: 4.3 mmol/L (ref 3.5–5.2)
Sodium: 144 mmol/L (ref 134–144)
Total Protein: 7.8 g/dL (ref 6.0–8.5)
eGFR: 83 mL/min/{1.73_m2} (ref >59)

## 2021-12-24 LAB — TSH: TSH: 0.825 u[IU]/mL (ref 0.450–4.500)

## 2021-12-24 LAB — HEMOGLOBIN A1C
Est. average glucose Bld gHb Est-mCnc: 128 mg/dL
Hgb A1c MFr Bld: 6.1 % — ABNORMAL HIGH (ref 4.8–5.6)

## 2021-12-24 NOTE — Progress Notes (Signed)
Make patient aware we need a fasting cholesterol lab to be obtained when best for her.

## 2021-12-24 NOTE — Progress Notes (Signed)
Please call patient with update.   The following abnormalities are noted:   - Prediabetes stable. No medication needed as of present. Recheck in 6 months.   All other values are normal, stable or within acceptable limits.  Medication changes / Follow up labs / Other changes or recommendations:   - Recheck prediabetes in 6 months.   Camillia Herter, NP 12/24/2021 7:34 AM

## 2021-12-27 ENCOUNTER — Telehealth: Payer: Self-pay | Admitting: Family

## 2021-12-27 NOTE — Telephone Encounter (Signed)
Received vm from patient asking for a call back in reference to her lab results. Please advise.

## 2021-12-29 ENCOUNTER — Telehealth: Payer: Self-pay | Admitting: Family

## 2021-12-29 NOTE — Telephone Encounter (Signed)
Received vm from patient asking for a call back in reference to her lab results. Please advise.

## 2021-12-29 NOTE — Telephone Encounter (Signed)
Left message on voicemail to return call.

## 2021-12-31 ENCOUNTER — Other Ambulatory Visit: Payer: Self-pay

## 2021-12-31 ENCOUNTER — Ambulatory Visit
Admission: RE | Admit: 2021-12-31 | Discharge: 2021-12-31 | Disposition: A | Discharge status: Home / Self Care | Payer: Medicare Other | Source: Ambulatory Visit | Attending: Family | Admitting: Family

## 2021-12-31 DIAGNOSIS — Z1231 Encounter for screening mammogram for malignant neoplasm of breast: Secondary | ICD-10-CM

## 2022-01-01 NOTE — Telephone Encounter (Signed)
Pt has been contacted.

## 2022-01-01 NOTE — Progress Notes (Signed)
Please call patient with update.   No mammogram evidence of malignancy. Repeat in 12 months.

## 2022-01-12 ENCOUNTER — Ambulatory Visit (HOSPITAL_COMMUNITY)
Admission: RE | Admit: 2022-01-12 | Discharge: 2022-01-12 | Disposition: A | Discharge status: Home / Self Care | Payer: Medicare Other | Source: Ambulatory Visit | Attending: Family | Admitting: Family

## 2022-01-12 ENCOUNTER — Other Ambulatory Visit: Payer: Self-pay

## 2022-01-12 DIAGNOSIS — I2510 Atherosclerotic heart disease of native coronary artery without angina pectoris: Secondary | ICD-10-CM | POA: Diagnosis not present

## 2022-01-12 DIAGNOSIS — Z122 Encounter for screening for malignant neoplasm of respiratory organs: Secondary | ICD-10-CM | POA: Diagnosis not present

## 2022-01-12 DIAGNOSIS — F17210 Nicotine dependence, cigarettes, uncomplicated: Secondary | ICD-10-CM | POA: Insufficient documentation

## 2022-01-12 DIAGNOSIS — I700 Atherosclerosis of aorta: Secondary | ICD-10-CM | POA: Diagnosis not present

## 2022-01-12 DIAGNOSIS — J439 Emphysema, unspecified: Secondary | ICD-10-CM | POA: Diagnosis not present

## 2022-01-13 ENCOUNTER — Other Ambulatory Visit: Payer: Self-pay | Admitting: Family

## 2022-01-13 DIAGNOSIS — I700 Atherosclerosis of aorta: Secondary | ICD-10-CM

## 2022-01-13 DIAGNOSIS — J449 Chronic obstructive pulmonary disease, unspecified: Secondary | ICD-10-CM

## 2022-01-13 DIAGNOSIS — J439 Emphysema, unspecified: Secondary | ICD-10-CM

## 2022-01-13 DIAGNOSIS — I2510 Atherosclerotic heart disease of native coronary artery without angina pectoris: Secondary | ICD-10-CM

## 2022-01-13 NOTE — Progress Notes (Signed)
Call patient with update.  ? ?Referral to Pulmonology for COPD and emphysema.  ? ?Referral to Cardiology for coronary artery calcifications and aortic atherosclerosis.  ? ?Their offices should call within 2 weeks with appointment details. Update primary provider if not.

## 2022-01-26 ENCOUNTER — Other Ambulatory Visit: Payer: Self-pay | Admitting: Family

## 2022-01-26 DIAGNOSIS — E785 Hyperlipidemia, unspecified: Secondary | ICD-10-CM

## 2022-02-09 NOTE — Progress Notes (Signed)
?Cardiology Office Note:   ? ?Date:  02/10/2022  ? ?ID:  Brittany Duffy, DOB [birth year], MRN [id number] ? ?PCP:  Camillia Herter, NP  ?Cardiologist:  None  ? ?Referring MD: Camillia Herter, NP  ? ?Chief Complaint  ?Patient presents with  ? Advice Only  ?  Asymptomatic coronary artery disease as noted by three-vessel coronary calcification on non-coronary CT  ? Hyperlipidemia  ? Hypertension  ? ? ?History of Present Illness:   ? ?Brittany Duffy is a 66 y.o. female with a hx of coronary artery calcification for evaluation. ? ?Patient smokes cigarettes greater than 1 pack/day for greater than 20 years, has hypertension, prediabetes, and family history of CAD. ? ? ?She is physically active and does not have exertion related chest discomfort or excessive dyspnea.  She denies orthopnea, PND, palpitations. ? ?We discussed risk factors and genetic predisposition to atherosclerosis.  She is overweight, loves starchy foods, does not particularly monitor the salt intake, and eats fried and animal related foods.  She is not compliant with statin therapy. ? ?Past Medical History:  ?Diagnosis Date  ? Bronchitis   ? Dysplasia of cervix, low grade (CIN 1)   ? S/P CRYO  ? Fibroid   ? Hepatitis C   ? Hypertension   ? Osteopenia 08/2018  ? T score -1.9 FRAX 4.7% / 1%  ? Vaginal intraepithelial neoplasia III (VAIN III)   ? ? ?Past Surgical History:  ?Procedure Laterality Date  ? CARPAL TUNNEL RELEASE    ? CO2 LSAER OF VAGINA  07, 11  ? DIVERTICULUM OF THE BLADDER    ? OOPHORECTOMY    ? BSO  ? TOTAL ABDOMINAL HYSTERECTOMY  2004  ? BSO  ? ? ?Current Medications: ?Current Meds  ?Medication Sig  ? ALPRAZolam (XANAX) 0.5 MG tablet Take 0.5 mg by mouth.   ? amLODipine (NORVASC) 10 MG tablet Take 1 tablet (10 mg total) by mouth daily.  ? Ascorbic Acid (VITAMIN C PO) Take 1 tablet by mouth daily.   ? CALCIUM PO Take 1 tablet by mouth daily.   ? cetirizine (ZYRTEC) 10 MG tablet Take 10 mg by mouth 2 (two) times daily.  ?  Cholecalciferol (VITAMIN D PO) Take by mouth.  ? Cyanocobalamin (VITAMIN B 12 PO) Take by mouth.  ? fish oil-omega-3 fatty acids 1000 MG capsule Take 2 g by mouth daily.    ? fluticasone (FLONASE) 50 MCG/ACT nasal spray Place 2 sprays into both nostrils daily.  ? gabapentin (NEURONTIN) 300 MG capsule Take 1 capsule (300 mg total) by mouth 2 (two) times daily.  ? GARLIC PO Take by mouth.  ? losartan (COZAAR) 25 MG tablet Take 1 tablet (25 mg total) by mouth daily.  ? MILK THISTLE PO Take by mouth.  ? mometasone (ELOCON) 0.1 % cream Use once daily on affected skin as directed.  ? Multiple Vitamin (MULTIVITAMIN PO) Take 1 tablet by mouth daily. Reported on 04/28/2016  ? rosuvastatin (CRESTOR) 20 MG tablet Take 1 tablet (20 mg total) by mouth daily.  ? VITAMIN A PO Take by mouth.  ? VITAMIN E PO Take 1 tablet by mouth daily.   ? [DISCONTINUED] atorvastatin (LIPITOR) 10 MG tablet TAKE 1 TABLET BY MOUTH DAILY  ?  ? ?Allergies:   Patient has no known allergies.  ? ?Social History  ? ?Socioeconomic History  ? Marital status: Single  ?  Spouse name: Not on file  ? Number of children: Not on file  ?  Years of education: Not on file  ? Highest education level: Not on file  ?Occupational History  ? Not on file  ?Tobacco Use  ? Smoking status: Every Day  ?  Packs/day: 1.50  ?  Types: Cigarettes  ? Smokeless tobacco: Current  ?Vaping Use  ? Vaping Use: Never used  ?Substance and Sexual Activity  ? Alcohol use: Yes  ?  Alcohol/week: 0.0 standard drinks  ?  Comment: occ.  ? Drug use: No  ? Sexual activity: Yes  ?  Partners: Male  ?  Birth control/protection: Surgical  ?  Comment: Hyst., INTERCOURSE AGE 36, SEXUAL PARTNERS MORE THAN 5  ?Other Topics Concern  ? Not on file  ?Social History Narrative  ? Not on file  ? ?Social Determinants of Health  ? ?Financial Resource Strain: Not on file  ?Food Insecurity: Not on file  ?Transportation Needs: Not on file  ?Physical Activity: Not on file  ?Stress: Not on file  ?Social Connections: Not  on file  ?  ? ?Family History: ?The patient's family history includes Breast cancer in her mother; Diabetes in her mother; Hypertension in her maternal grandfather and mother. ? ?ROS:   ?Please see the history of present illness.    ?Positive stress at home.  Does not sleep well.  Chronic back pain.  All other systems reviewed and are negative. ? ?EKGs/Labs/Other Studies Reviewed:   ? ?The following studies were reviewed today: ?CT chest cancer screening study 01/12/2022: ?IMPRESSION: ?1. Lung-RADS 2, benign appearance or behavior. Continue annual ?screening with low-dose chest CT without contrast in 12 months. ?2. Coronary artery calcifications. ?3. Diffuse bronchial wall thickening with emphysema, as above; ?imaging findings suggestive of underlying COPD. ?4. Aortic Atherosclerosis (ICD10-I70.0) and Emphysema (ICD10-J43.9). ?  ?EKG:  EKG normal sinus rhythm with overall normal appearance. ? ?Recent Labs: ?12/23/2021: ALT 10; BUN 10; Creatinine, Ser 0.78; Hemoglobin 13.7; Platelets 173; Potassium 4.3; Sodium 144; TSH 0.825  ?Recent Lipid Panel ?   ?Component Value Date/Time  ? CHOL 205 (H) 05/09/2021 1527  ? TRIG 124 05/09/2021 1527  ? HDL 54 05/09/2021 1527  ? CHOLHDL 3.8 05/09/2021 1527  ? LDLCALC 129 (H) 05/09/2021 1527  ? ? ?Physical Exam:   ? ?VS:  BP 118/74   Pulse 73   Ht 5' (1.524 m)   Wt 148 lb 6.4 oz (67.3 kg)   LMP 01/23/2012   SpO2 96%   BMI 28.98 kg/m?    ? ?Wt Readings from Last 3 Encounters:  ?02/10/22 148 lb 6.4 oz (67.3 kg)  ?12/23/21 145 lb (65.8 kg)  ?09/09/21 141 lb (64 kg)  ?  ? ?GEN: Overweight. No acute distress ?HEENT: Normal ?NECK: No JVD. ?LYMPHATICS: No lymphadenopathy ?CARDIAC: No murmur. RRR no gallop, or edema. ?VASCULAR:  Normal Pulses. No bruits. ?RESPIRATORY:  Clear to auscultation without rales, wheezing or rhonchi  ?ABDOMEN: Soft, non-tender, non-distended, No pulsatile mass, ?MUSCULOSKELETAL: No deformity.  Pulses are 2+ and symmetrical in upper and lower extremities. ?SKIN:  Warm and dry ?NEUROLOGIC:  Alert and oriented x 3 ?PSYCHIATRIC:  Normal affect  ? ?ASSESSMENT:   ? ?1. Coronary artery calcification   ?2. Hyperlipidemia, unspecified hyperlipidemia type   ?3. Prediabetes   ?4. Cigarette smoker one half pack a day or less   ?5. Chronic active hepatitis C ([place])   ? ?PLAN:   ? ?In order of problems listed above: ? ?Coronary artery disease without angina.  Aggressive secondary prevention as outlined below is discussed in great detail. ?  Target LDL should be less than 70 in which she use high intensity statin therapy.  Recommend switching to rosuvastatin 20 mg daily and checking a liver and lipid panel in 6 to 8 weeks. ?Keep hemoglobin A1c less than 7.  Diet, exercise, decrease carbohydrate intake. ?Smoking cessation discussed in great detail.  She is not ready to commit. ?Did not discuss ? ? ?Overall education and awareness concerning secondary risk prevention was discussed in detail: LDL less than 70, hemoglobin A1c less than 7, blood pressure target less than 130/80 mmHg, >150 minutes of moderate aerobic activity per week, avoidance of smoking, weight control (via diet and exercise), and continued surveillance/management of/for obstructive sleep apnea. ? ? ? ?Medication Adjustments/Labs and Tests Ordered: ?Current medicines are reviewed at length with the patient today.  Concerns regarding medicines are outlined above.  ?Orders Placed This Encounter  ?Procedures  ? Hepatic function panel  ? Lipid panel  ? EKG 12-Lead  ? ?Meds ordered this encounter  ?Medications  ? rosuvastatin (CRESTOR) 20 MG tablet  ?  Sig: Take 1 tablet (20 mg total) by mouth daily.  ?  Dispense:  90 tablet  ?  Refill:  3  ?  D/c Atorvastatin  ? ? ?Patient Instructions  ?Medication Instructions:  ?1) DISCONTINUE Atorvastatin  ?2) START Rosuvastatin '20mg'$  once daily ? ?*If you need a refill on your cardiac medications before your next appointment, please call your pharmacy* ? ? ?Lab Work: ?Liver and Lipid in 6-8  weeks.  You will need to be fasting for these labs (nothing to eat or drink after midnight except water and black coffee). ? ?If you have labs (blood work) drawn today and your tests are completely normal, you will receiv

## 2022-02-10 ENCOUNTER — Ambulatory Visit: Payer: Medicare Other | Admitting: Interventional Cardiology

## 2022-02-10 ENCOUNTER — Encounter: Payer: Self-pay | Admitting: Interventional Cardiology

## 2022-02-10 VITALS — BP 118/74 | HR 73 | Ht 60.0 in | Wt 148.4 lb

## 2022-02-10 DIAGNOSIS — E785 Hyperlipidemia, unspecified: Secondary | ICD-10-CM | POA: Diagnosis not present

## 2022-02-10 DIAGNOSIS — R7303 Prediabetes: Secondary | ICD-10-CM

## 2022-02-10 DIAGNOSIS — F17210 Nicotine dependence, cigarettes, uncomplicated: Secondary | ICD-10-CM | POA: Diagnosis not present

## 2022-02-10 DIAGNOSIS — B182 Chronic viral hepatitis C: Secondary | ICD-10-CM

## 2022-02-10 DIAGNOSIS — I2510 Atherosclerotic heart disease of native coronary artery without angina pectoris: Secondary | ICD-10-CM | POA: Diagnosis not present

## 2022-02-10 DIAGNOSIS — I2584 Coronary atherosclerosis due to calcified coronary lesion: Secondary | ICD-10-CM | POA: Diagnosis not present

## 2022-02-10 MED ORDER — ROSUVASTATIN CALCIUM 20 MG PO TABS: 20.0000 mg | ORAL_TABLET | Freq: Every day | ORAL | 3 refills | Status: DC

## 2022-02-10 NOTE — Patient Instructions (Signed)
Medication Instructions:  ?1) DISCONTINUE Atorvastatin  ?2) START Rosuvastatin '20mg'$  once daily ? ?*If you need a refill on your cardiac medications before your next appointment, please call your pharmacy* ? ? ?Lab Work: ?Liver and Lipid in 6-8 weeks.  You will need to be fasting for these labs (nothing to eat or drink after midnight except water and black coffee). ? ?If you have labs (blood work) drawn today and your tests are completely normal, you will receive your results only by: ?MyChart Message (if you have MyChart) OR ?A paper copy in the mail ?If you have any lab test that is abnormal or we need to change your treatment, we will call you to review the results. ? ? ?Testing/Procedures: ?None ? ? ?Follow-Up: ?At [facility], you and your health needs are our priority.  As part of our continuing mission to provide you with exceptional heart care, we have created designated Provider Care Teams.  These Care Teams include your primary Cardiologist (physician) and Advanced Practice Providers (APPs -  Physician Assistants and Nurse Practitioners) who all work together to provide you with the care you need, when you need it. ? ?We recommend signing up for the patient portal called "MyChart".  Sign up information is provided on this After Visit Summary.  MyChart is used to connect with patients for Virtual Visits (Telemedicine).  Patients are able to view lab/test results, encounter notes, upcoming appointments, etc.  Non-urgent messages can be sent to your provider as well.   ?To learn more about what you can do with MyChart, go to [email or web address].   ? ?Your next appointment:   ?6 month(s) ? ?The format for your next appointment:   ?In Person ? ?Provider:   ?[facility]. Blenda Bridegroom, MD  ? ? ?Other Instructions ? ?Your blood pressure needs to be 130/80 or less. ?LDL (bad) cholesterol should be 70 or less. ?Hemoglobin A1C should be less than 7. ?Please work to STOP smoking! ? ?Steps to Quit Smoking ?Smoking  tobacco is the leading cause of preventable death. It can affect almost every organ in the body. Smoking puts you and people around you at risk for many serious, long-lasting (chronic) diseases. Quitting smoking can be hard, but it is one of the best things that you can do for your health. It is never too late to quit. ?How do I get ready to quit? ?When you decide to quit smoking, make a plan to help you succeed. Before you quit: ?Pick a date to quit. Set a date within the next 2 weeks to give you time to prepare. ?Write down the reasons why you are quitting. Keep this list in places where you will see it often. ?Tell your family, friends, and co-workers that you are quitting. Their support is important. ?Talk with your doctor about the choices that may help you quit. ?Find out if your health insurance will pay for these treatments. ?Know the people, places, things, and activities that make you want to smoke (triggers). Avoid them. ?What first steps can I take to quit smoking? ?Throw away all cigarettes at home, at work, and in your car. ?Throw away the things that you use when you smoke, such as ashtrays and lighters. ?Clean your car. Make sure to empty the ashtray. ?Clean your home, including curtains and carpets. ?What can I do to help me quit smoking? ?Talk with your doctor about taking medicines and seeing a counselor at the same time. You are more likely to succeed when you  do both. ?If you are pregnant or breastfeeding, talk with your doctor about counseling or other ways to quit smoking. Do not take medicine to help you quit smoking unless your doctor tells you to do so. ?To quit smoking: ?Quit right away ?Quit smoking totally, instead of slowly cutting back on how much you smoke over a period of time. ?Go to counseling. You are more likely to quit if you go to counseling sessions regularly. ?Take medicine ?You may take medicines to help you quit. Some medicines need a prescription, and some you can buy  over-the-counter. Some medicines may contain a drug called nicotine to replace the nicotine in cigarettes. Medicines may: ?Help you to stop having the desire to smoke (cravings). ?Help to stop the problems that come when you stop smoking (withdrawal symptoms). ?Your doctor may ask you to use: ?Nicotine patches, gum, or lozenges. ?Nicotine inhalers or sprays. ?Non-nicotine medicine that is taken by mouth. ?Find resources ?Find resources and other ways to help you quit smoking and remain smoke-free after you quit. These resources are most helpful when you use them often. They include: ?Online chats with a Social worker. ?Phone quitlines. ?Careers information officer. ?Support groups or group counseling. ?Text messaging programs. ?Mobile phone apps. Use apps on your mobile phone or tablet that can help you stick to your quit plan. There are many free apps for mobile phones and tablets as well as websites. Examples include Quit Guide from the [employer] and smokefree.gov ? ?What things can I do to make it easier to quit? ? ?Talk to your family and friends. Ask them to support and encourage you. ?Call a phone quitline (1-800-QUIT-NOW), reach out to support groups, or work with a Social worker. ?Ask people who smoke to not smoke around you. ?Avoid places that make you want to smoke, such as: ?Bars. ?Parties. ?Smoke-break areas at work. ?Spend time with people who do not smoke. ?Lower the stress in your life. Stress can make you want to smoke. Try these things to help your stress: ?Getting regular exercise. ?Doing deep-breathing exercises. ?Doing yoga. ?Meditating. ?Doing a body scan. To do this, close your eyes, focus on one area of your body at a time from head to toe. Notice which parts of your body are tense. Try to relax the muscles in those areas. ?How will I feel when I quit smoking? ?Day 1 to 3 weeks ?Within the first 24 hours, you may start to have some problems that come from quitting tobacco. These problems are very bad 2-3  days after you quit, but they do not often last for more than 2-3 weeks. You may get these symptoms: ?Mood swings. ?Feeling restless, nervous, angry, or annoyed. ?Trouble concentrating. ?Dizziness. ?Strong desire for high-sugar foods and nicotine. ?Weight gain. ?Trouble pooping (constipation). ?Feeling like you may vomit (nausea). ?Coughing or a sore throat. ?Changes in how the medicines that you take for other issues work in your body. ?Depression. ?Trouble sleeping (insomnia). ?Week 3 and afterward ?After the first 2-3 weeks of quitting, you may start to notice more positive results, such as: ?Better sense of smell and taste. ?Less coughing and sore throat. ?Slower heart rate. ?Lower blood pressure. ?Clearer skin. ?Better breathing. ?Fewer sick days. ?Quitting smoking can be hard. Do not give up if you fail the first time. Some people need to try a few times before they succeed. Do your best to stick to your quit plan, and talk with your doctor if you have any questions or concerns. ?Summary ?Smoking tobacco  is the leading cause of preventable death. Quitting smoking can be hard, but it is one of the best things that you can do for your health. ?When you decide to quit smoking, make a plan to help you succeed. ?Quit smoking right away, not slowly over a period of time. ?When you start quitting, seek help from your doctor, family, or friends. ?This information is not intended to replace advice given to you by your health care provider. Make sure you discuss any questions you have with your health care provider. ?Document Revised: 07/04/2021 Document Reviewed: 01/14/2019 ?Elsevier Patient Education ? [place]. ?  ?

## 2022-03-12 ENCOUNTER — Other Ambulatory Visit: Payer: Self-pay | Admitting: Family

## 2022-03-12 DIAGNOSIS — I10 Essential (primary) hypertension: Secondary | ICD-10-CM

## 2022-03-24 ENCOUNTER — Other Ambulatory Visit: Payer: Medicare Other | Admitting: *Deleted

## 2022-03-24 DIAGNOSIS — E785 Hyperlipidemia, unspecified: Secondary | ICD-10-CM

## 2022-03-24 LAB — HEPATIC FUNCTION PANEL
ALT: 12 IU/L (ref 0–32)
AST: 18 IU/L (ref 0–40)
Albumin: 4.9 g/dL — ABNORMAL HIGH (ref 3.8–4.8)
Alkaline Phosphatase: 67 IU/L (ref 44–121)
Bilirubin Total: 0.3 mg/dL (ref 0.0–1.2)
Bilirubin, Direct: 0.14 mg/dL (ref 0.00–0.40)
Total Protein: 7.6 g/dL (ref 6.0–8.5)

## 2022-03-24 LAB — LIPID PANEL
Chol/HDL Ratio: 2.2 ratio (ref 0.0–4.4)
Cholesterol, Total: 118 mg/dL (ref 100–199)
HDL: 53 mg/dL (ref >39)
LDL Chol Calc (NIH): 46 mg/dL (ref 0–99)
Triglycerides: 99 mg/dL (ref 0–149)
VLDL Cholesterol Cal: 19 mg/dL (ref 5–40)

## 2022-04-14 NOTE — Progress Notes (Signed)
Patient ID: NATAHLIA HOGGARD, female    DOB: 1956-01-04  MRN: [id number]  CC: Chronic Conditions Follow-Up  Subjective: Tashawn Greff is a 66 y.o. female who presents for chronic conditions follow-up.  Her concerns today include: Hypertension follow-up: 12/23/2021: - Continue Amlodipine and Losartan as prescribed.   04/21/2022: Doing well on current regimen. No side effects. No issues/concerns.   2. Smoking cessation:  Reports would like to quit and will try natural measures to do so. Not ready for medication as of present.   Patient Active Problem List   Diagnosis Date Noted   Prediabetes 05/10/2021   Hyperlipidemia 05/10/2021   History of total abdominal hysterectomy 08/27/2020   Episodic tobacco dependence 12/14/2015   Chronic active hepatitis C ([place]) 12/14/2015   Hepatitis, viral 09/25/2015   Allergic urticaria 09/25/2015   Cigarette smoker one half pack a day or less 02/17/2013   Ovarian abscess    Vaginal intraepithelial neoplasia III (VAIN III)    Osteopenia      Current Outpatient Medications on File Prior to Visit  Medication Sig Dispense Refill   ALPRAZolam (XANAX) 0.5 MG tablet Take 0.5 mg by mouth.   1   Ascorbic Acid (VITAMIN C PO) Take 1 tablet by mouth daily.      CALCIUM PO Take 1 tablet by mouth daily.      cetirizine (ZYRTEC) 10 MG tablet Take 10 mg by mouth 2 (two) times daily.     Cholecalciferol (VITAMIN D PO) Take by mouth.     Cyanocobalamin (VITAMIN B 12 PO) Take by mouth.     fish oil-omega-3 fatty acids 1000 MG capsule Take 2 g by mouth daily.       fluticasone (FLONASE) 50 MCG/ACT nasal spray Place 2 sprays into both nostrils daily. 16 g 0   gabapentin (NEURONTIN) 300 MG capsule Take 1 capsule (300 mg total) by mouth 2 (two) times daily. 60 capsule 2   GARLIC PO Take by mouth.     MILK THISTLE PO Take by mouth.     mometasone (ELOCON) 0.1 % cream Use once daily on affected skin as directed. 90 g 2   Multiple Vitamin  (MULTIVITAMIN PO) Take 1 tablet by mouth daily. Reported on 04/28/2016     rosuvastatin (CRESTOR) 20 MG tablet Take 1 tablet (20 mg total) by mouth daily. 90 tablet 3   VITAMIN A PO Take by mouth.     VITAMIN E PO Take 1 tablet by mouth daily.      No current facility-administered medications on file prior to visit.    No Known Allergies  Social History   Socioeconomic History   Marital status: Single    Spouse name: Not on file   Number of children: Not on file   Years of education: Not on file   Highest education level: Not on file  Occupational History   Not on file  Tobacco Use   Smoking status: Every Day    Packs/day: 1.50    Types: Cigarettes   Smokeless tobacco: Current  Vaping Use   Vaping Use: Never used  Substance and Sexual Activity   Alcohol use: Yes    Alcohol/week: 0.0 standard drinks of alcohol    Comment: occ.   Drug use: No   Sexual activity: Yes    Partners: Male    Birth control/protection: Surgical    Comment: Hyst., INTERCOURSE AGE 42, SEXUAL PARTNERS MORE THAN 5  Other Topics Concern   Not on file  Social History Narrative   Not on file   Social Determinants of Health   Financial Resource Strain: Not on file  Food Insecurity: Not on file  Transportation Needs: Not on file  Physical Activity: Not on file  Stress: Not on file  Social Connections: Not on file  Intimate Partner Violence: Not on file    Family History  Problem Relation Age of Onset   Diabetes Mother    Hypertension Mother    Breast cancer Mother        Age Late 46's   Hypertension Maternal Grandfather     Past Surgical History:  Procedure Laterality Date   CARPAL TUNNEL RELEASE     CO2 LSAER OF VAGINA  07, 11   DIVERTICULUM OF THE BLADDER     OOPHORECTOMY     BSO   TOTAL ABDOMINAL HYSTERECTOMY  2004   BSO    ROS: Review of Systems Negative except as stated above  PHYSICAL EXAM: BP 128/73 (BP Location: Left Arm, Patient Position: Sitting, Cuff Size: Large)    Pulse 87   Temp 98.3 F (36.8 C)   Resp 16   Ht 5' (1.524 m)   Wt 141 lb (64 kg)   LMP 01/23/2012   SpO2 98%   BMI 27.54 kg/m   Physical Exam HENT:     Head: Normocephalic and atraumatic.  Eyes:     Extraocular Movements: Extraocular movements intact.     Conjunctiva/sclera: Conjunctivae normal.     Pupils: Pupils are equal, round, and reactive to light.  Cardiovascular:     Rate and Rhythm: Normal rate and regular rhythm.     Pulses: Normal pulses.     Heart sounds: Normal heart sounds.  Pulmonary:     Effort: Pulmonary effort is normal.     Breath sounds: Normal breath sounds.  Musculoskeletal:     Cervical back: Normal range of motion and neck supple.  Neurological:     General: No focal deficit present.     Mental Status: She is alert and oriented to person, place, and time.  Psychiatric:        Mood and Affect: Mood normal.        Behavior: Behavior normal.     ASSESSMENT AND PLAN: 1. Essential (primary) hypertension - Continue Amlodipine and Losartan as prescribed.  - Counseled on blood pressure goal of less than 140/90, low-sodium, DASH diet, medication compliance, 150 minutes of moderate intensity exercise per week as tolerated. Discussed medication compliance, adverse effects. - Follow-up with primary provider in 4 months or sooner if needed.  - amLODipine (NORVASC) 10 MG tablet; Take 1 tablet (10 mg total) by mouth daily.  Dispense: 30 tablet; Refill: 3 - losartan (COZAAR) 25 MG tablet; Take 1 tablet (25 mg total) by mouth daily.  Dispense: 30 tablet; Refill: 3  2. Encounter for smoking cessation counseling - Counseled to quit.  Discussed health risk associated with smoking and cessation methods including NRT, Chantix, Buproprion. Patient prefers natural measures to quit.    Patient was given the opportunity to ask questions.  Patient verbalized understanding of the plan and was able to repeat key elements of the plan. Patient was given clear instructions to  go to Emergency Department or return to medical center if symptoms don't improve, worsen, or new problems develop.The patient verbalized understanding.  Requested Prescriptions   Signed Prescriptions Disp Refills   amLODipine (NORVASC) 10 MG tablet 30 tablet 3    Sig: Take 1 tablet (10 mg  total) by mouth daily.   losartan (COZAAR) 25 MG tablet 30 tablet 3    Sig: Take 1 tablet (25 mg total) by mouth daily.    Return in about 4 months (around 08/21/2022) for Follow-Up or next available HTN.  Camillia Herter, NP

## 2022-04-20 ENCOUNTER — Institutional Professional Consult (permissible substitution): Payer: Medicare Other | Admitting: Pulmonary Disease

## 2022-04-21 ENCOUNTER — Ambulatory Visit (INDEPENDENT_AMBULATORY_CARE_PROVIDER_SITE_OTHER): Payer: Medicare Other | Admitting: Family

## 2022-04-21 VITALS — BP 128/73 | HR 87 | Temp 98.3°F | Resp 16 | Ht 60.0 in | Wt 141.0 lb

## 2022-04-21 DIAGNOSIS — Z1389 Encounter for screening for other disorder: Secondary | ICD-10-CM

## 2022-04-21 DIAGNOSIS — I10 Essential (primary) hypertension: Secondary | ICD-10-CM | POA: Diagnosis not present

## 2022-04-21 DIAGNOSIS — E785 Hyperlipidemia, unspecified: Secondary | ICD-10-CM

## 2022-04-21 DIAGNOSIS — Z716 Tobacco abuse counseling: Secondary | ICD-10-CM

## 2022-04-21 MED ORDER — AMLODIPINE BESYLATE 10 MG PO TABS: 10.0000 mg | ORAL_TABLET | Freq: Every day | ORAL | 3 refills | Status: DC

## 2022-04-21 MED ORDER — LOSARTAN POTASSIUM 25 MG PO TABS: 25.0000 mg | ORAL_TABLET | Freq: Every day | ORAL | 3 refills | Status: DC

## 2022-04-21 NOTE — Progress Notes (Signed)
.  Pt presents for hypertension f/u   

## 2022-04-21 NOTE — Patient Instructions (Signed)
Amlodipine Tablets What is this medication? AMLODIPINE (am LOE di peen) treats high blood pressure and prevents chest pain (angina). It works by relaxing the blood vessels, which helps decrease the amount of work your heart has to do. It belongs to a group of medications called calcium channel blockers. This medicine may be used for other purposes; ask your health care provider or pharmacist if you have questions. COMMON BRAND NAME(S): Norvasc What should I tell my care team before I take this medication? They need to know if you have any of these conditions: Heart disease Liver disease An unusual or allergic reaction to amlodipine, other medications, foods, dyes, or preservatives Pregnant or trying to get pregnant Breast-feeding How should I use this medication? Take this medication by mouth. Take it as directed on the prescription label at the same time every day. You can take it with or without food. If it upsets your stomach, take it with food. Keep taking it unless your care team tells you to stop. Talk to your care team about the use of this medication in children. While it may be prescribed for children as young as 6 for selected conditions, precautions do apply. Overdosage: If you think you have taken too much of this medicine contact a poison control center or emergency room at once. NOTE: This medicine is only for you. Do not share this medicine with others. What if I miss a dose? If you miss a dose, take it as soon as you can. If it is almost time for your next dose, take only that dose. Do not take double or extra doses. What may interact with this medication? Clarithromycin Cyclosporine Diltiazem Itraconazole Simvastatin Tacrolimus This list may not describe all possible interactions. Give your health care provider a list of all the medicines, herbs, non-prescription drugs, or dietary supplements you use. Also tell them if you smoke, drink alcohol, or use illegal drugs. Some  items may interact with your medicine. What should I watch for while using this medication? Visit your health care provider for regular checks on your progress. Check your blood pressure as directed. Ask your health care provider what your blood pressure should be. Also, find out when you should contact him or her. Do not treat yourself for coughs, colds, or pain while you are using this medication without asking your health care provider for advice. Some medications may increase your blood pressure. You may get drowsy or dizzy. Do not drive, use machinery, or do anything that needs mental alertness until you know how this medication affects you. Do not stand up or sit up quickly, especially if you are an older patient. This reduces the risk of dizzy or fainting spells. Alcohol can make you more drowsy and dizzy. Avoid alcoholic drinks. What side effects may I notice from receiving this medication? Side effects that you should report to your care team as soon as possible: Allergic reactions--skin rash, itching, hives, swelling of the face, lips, tongue, or throat Heart attack--pain or tightness in the chest, shoulders, arms, or jaw, nausea, shortness of breath, cold or clammy skin, feeling faint or lightheaded Low blood pressure--dizziness, feeling faint or lightheaded, blurry vision Side effects that usually do not require medical attention (report these to your care team if they continue or are bothersome): Facial flushing, redness Heart palpitations--rapid, pounding, or irregular heartbeat Nausea Stomach pain Swelling of the ankles, hands, or feet This list may not describe all possible side effects. Call your doctor for medical advice about side   effects. You may report side effects to FDA at 1-800-FDA-1088. Where should I keep my medication? Keep out of the reach of children and pets. Store at room temperature between 20 and 25 degrees C (68 and 77 degrees F). Protect from light and moisture.  Keep the container tightly closed. Get rid of any unused medication after the expiration date. To get rid of medications that are no longer needed or have expired: Take the medication to a medication take-back program. Check with your pharmacy or law enforcement to find a location. If you cannot return the medication, check the label or package insert to see if the medication should be thrown out in the garbage or flushed down the toilet. If you are not sure, ask your health care provider. If it is safe to put in the trash, empty the medication out of the container. Mix the medication with cat litter, dirt, coffee grounds, or other unwanted substance. Seal the mixture in a bag or container. Put it in the trash. NOTE: This sheet is a summary. It may not cover all possible information. If you have questions about this medicine, talk to your doctor, pharmacist, or health care provider.  2023 Elsevier/Gold Standard (2021-09-26 00:00:00)  

## 2022-05-20 ENCOUNTER — Telehealth: Payer: Self-pay | Admitting: Interventional Cardiology

## 2022-05-20 NOTE — Telephone Encounter (Signed)
*  STAT* If patient is at the pharmacy, call can be transferred to refill team.   1. Which medications need to be refilled? (please list name of each medication and dose if known)  rosuvastatin (CRESTOR) 20 MG tablet  2. Which pharmacy/location (including street and city if local pharmacy) is medication to be sent to? [place], [place] - [address] AT [place]  3. Do they need a 30 day or 90 day supply?  90 day supply

## 2022-05-21 MED ORDER — ROSUVASTATIN CALCIUM 20 MG PO TABS: 20.0000 mg | ORAL_TABLET | Freq: Every day | ORAL | 2 refills | Status: DC

## 2022-05-21 NOTE — Telephone Encounter (Signed)
Pt's medication was sent to pt's pharmacy as requested. Confirmation received.  °

## 2022-05-28 ENCOUNTER — Ambulatory Visit: Payer: Medicare Other | Admitting: Interventional Cardiology

## 2022-06-03 IMAGING — MG MM DIGITAL SCREENING BILAT W/ TOMO AND CAD
8 series · 8 of 24 positions shown · non-contrast
Comparison: Previous exam(s).

CLINICAL DATA: Screening.

EXAM:
DIGITAL SCREENING BILATERAL MAMMOGRAM WITH TOMOSYNTHESIS AND CAD
TECHNIQUE: Bilateral screening digital craniocaudal and mediolateral oblique
mammograms were obtained. Bilateral screening digital breast
tomosynthesis was performed. The images were evaluated with
computer-aided detection.

[L CC synth-2D]
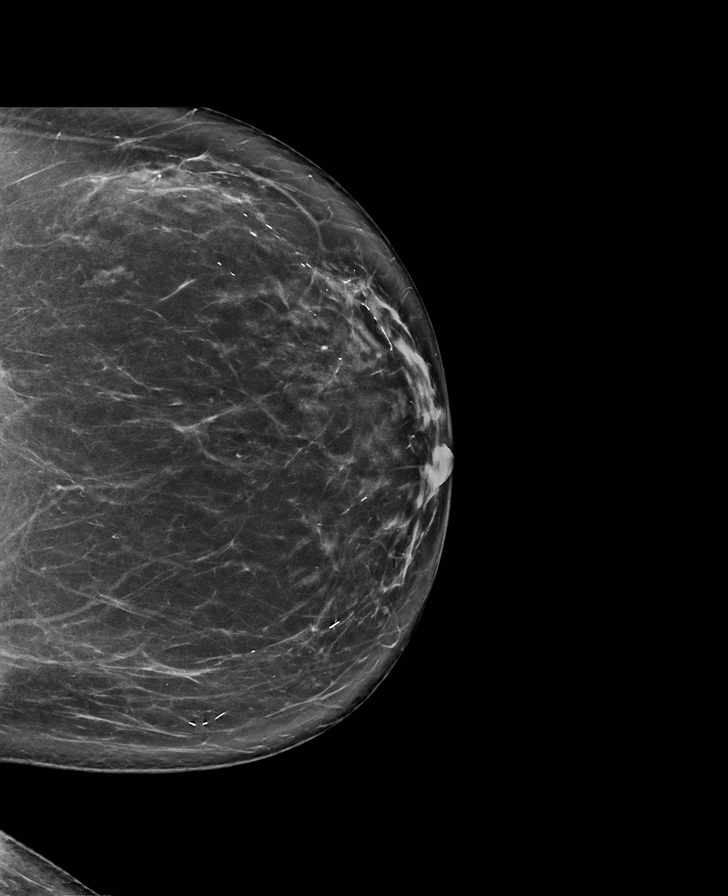

[L MLO synth-2D]
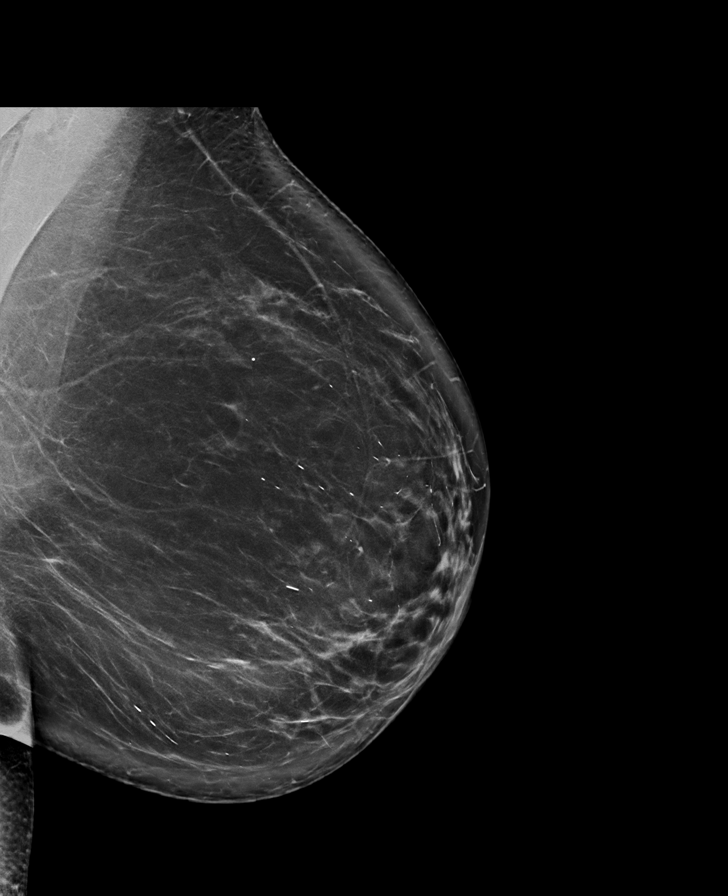

[R MLO synth-2D]
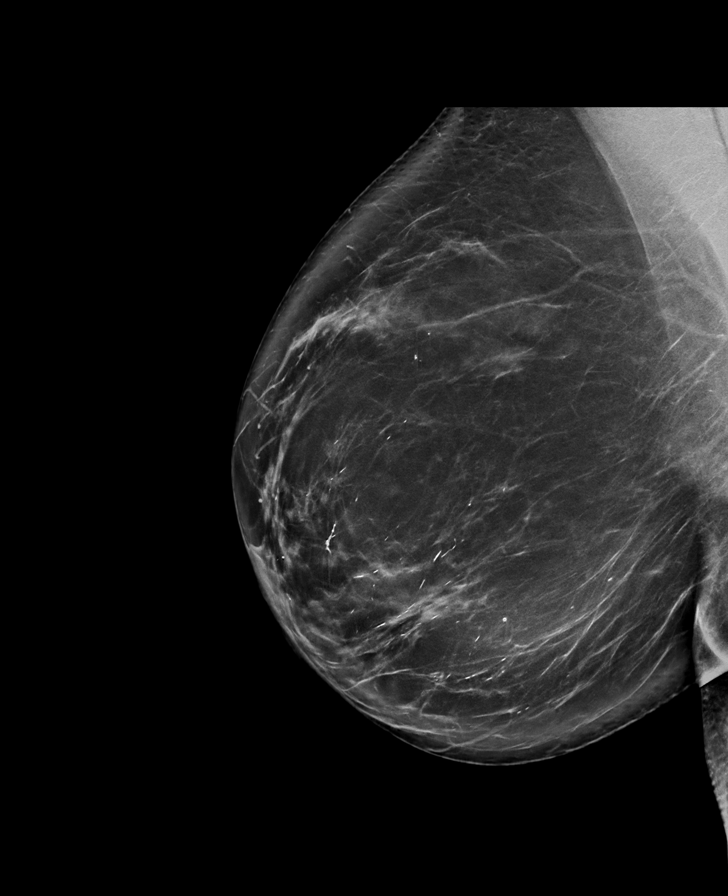

[R CC synth-2D]
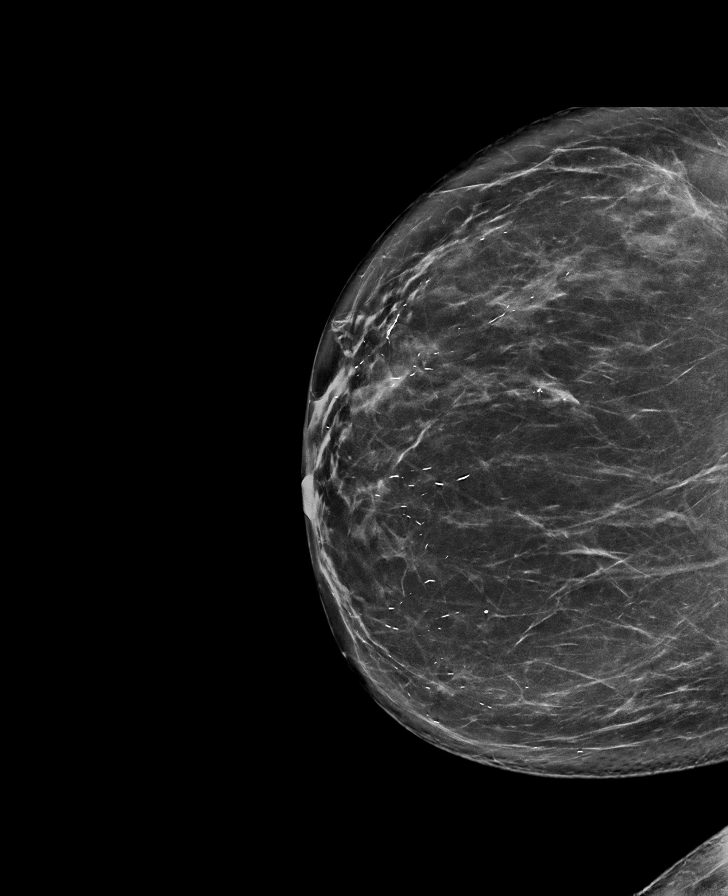

[R CC tomo · tomo slice 42/83.0]
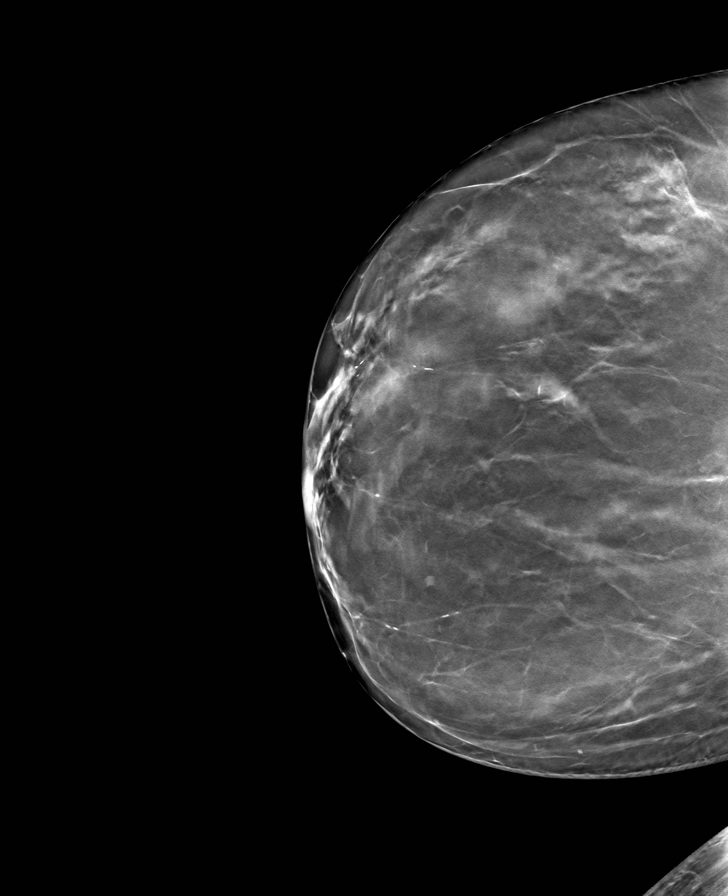

[L MLO tomo · tomo slice 50/99.0]
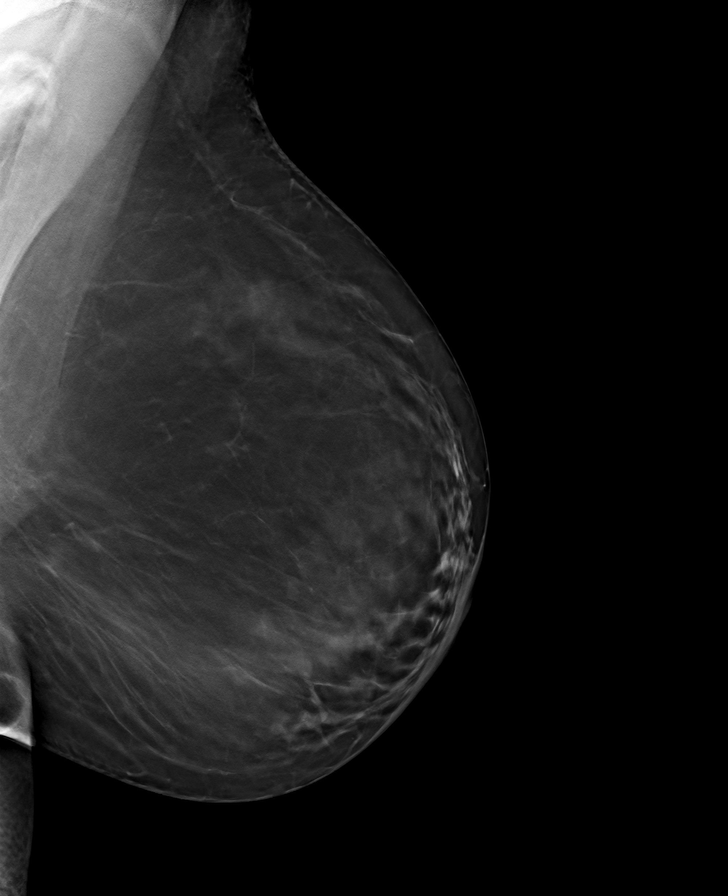

[R MLO tomo · tomo slice 49/98.0]
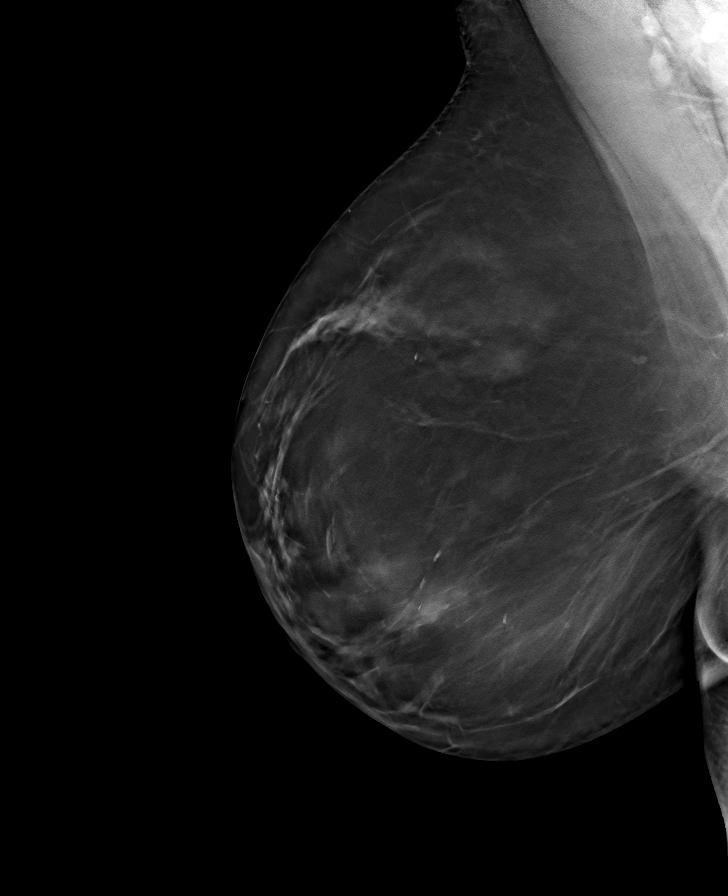

[L CC tomo · tomo slice 44/87.0]
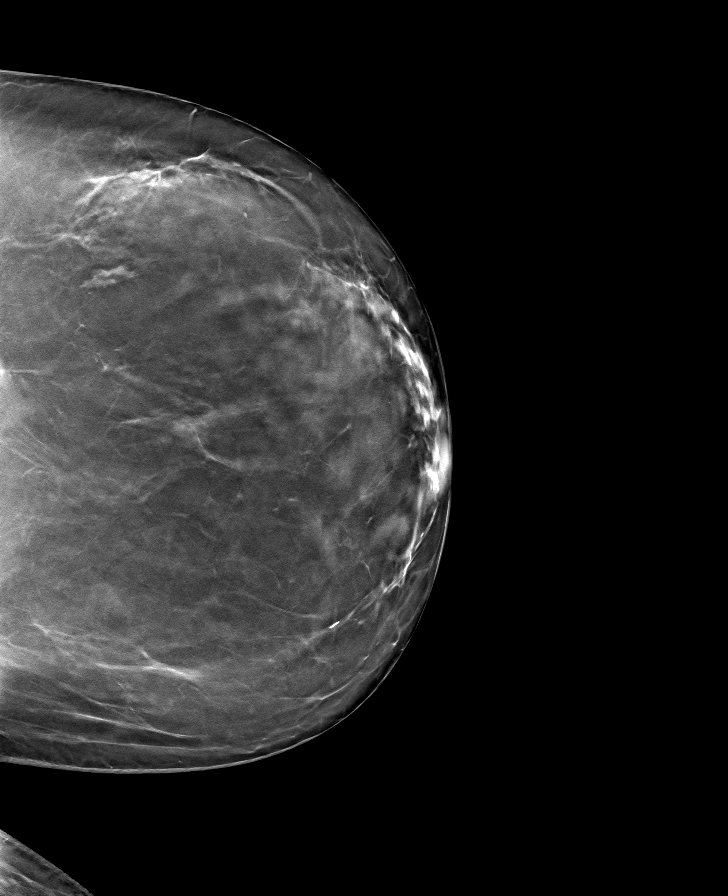

[8 of 24 positions shown; findings below may reference images not displayed]

ACR Breast Density Category b: There are scattered areas of
fibroglandular density.
FINDINGS: There are no findings suspicious for malignancy.
IMPRESSION: No mammographic evidence of malignancy. A result letter of this
screening mammogram will be mailed directly to the patient.

RECOMMENDATION:
Screening mammogram in one year. (Code:51-O-LD2)

BI-RADS CATEGORY  1: Negative.

## 2022-08-11 NOTE — Progress Notes (Signed)
Patient ID: Brittany Duffy, female    DOB: August 30, 1956  MRN: [id number]  CC: Chronic Care Management   Subjective: Brittany Duffy is a 66 y.o. female who presents for chronic care management.   Her concerns today include:  - Doing well on blood pressure medications.  - Doing well on Gabapentin.  - Prefers appointments at primary care every 6 months due to her busy schedule. - Established with Cardiology.  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 States she will call back to reschedule appointment and has contact information. I emphasized importance with patient and she verbalized understanding.  - Needs new referral to Psychiatry. Last referral was in [place] and did not want to drive that far.    Patient Active Problem List   Diagnosis Date Noted   Prediabetes 05/10/2021   Hyperlipidemia 05/10/2021   History of total abdominal hysterectomy 08/27/2020   Episodic tobacco dependence 12/14/2015   Chronic active hepatitis C ([place]) 12/14/2015   Hepatitis, viral 09/25/2015   Allergic urticaria 09/25/2015   Cigarette smoker one half pack a day or less 02/17/2013   Ovarian abscess    Vaginal intraepithelial neoplasia III (VAIN III)    Osteopenia      Current Outpatient Medications on File Prior to Visit  Medication Sig Dispense Refill   ALPRAZolam (XANAX) 0.5 MG tablet Take 0.5 mg by mouth.   1   Ascorbic Acid (VITAMIN C PO) Take 1 tablet by mouth daily.      aspirin EC 81 MG tablet Take 1 tablet (81 mg total) by mouth daily. Swallow whole.     CALCIUM PO Take 1 tablet by mouth daily.      cetirizine (ZYRTEC) 10 MG tablet Take 10 mg by mouth 2 (two) times daily.     Cholecalciferol (VITAMIN D PO) Take by mouth.     Cyanocobalamin (VITAMIN B 12 PO) Take by mouth.     fish oil-omega-3 fatty acids 1000 MG capsule Take 2 g by mouth daily.       fluticasone (FLONASE) 50 MCG/ACT nasal spray Place 2 sprays into  both nostrils daily. 16 g 0   GARLIC PO Take by mouth.     MILK THISTLE PO Take by mouth.     Multiple Vitamin (MULTIVITAMIN PO) Take 1 tablet by mouth daily. Reported on 04/28/2016     rosuvastatin (CRESTOR) 20 MG tablet Take 1 tablet (20 mg total) by mouth daily. 90 tablet 2   VITAMIN A PO Take by mouth.     VITAMIN E PO Take 1 tablet by mouth daily.      No current facility-administered medications on file prior to visit.    No Known Allergies  Social History   Socioeconomic History   Marital status: Single    Spouse name: Not on file   Number of children: Not on file   Years of education: Not on file   Highest education level: Not on file  Occupational History   Not on file  Tobacco Use   Smoking status: Every Day    Packs/day: 1.50    Types: Cigarettes    Passive exposure: Current   Smokeless tobacco: Current  Vaping Use   Vaping Use: Never used  Substance and Sexual Activity   Alcohol use: Yes    Alcohol/week: 0.0 standard drinks of alcohol    Comment: occ.   Drug use: No   Sexual activity: Yes  Partners: Male    Birth control/protection: Surgical    Comment: Hyst., INTERCOURSE AGE 44, SEXUAL PARTNERS MORE THAN 5  Other Topics Concern   Not on file  Social History Narrative   Not on file   Social Determinants of Health   Financial Resource Strain: Not on file  Food Insecurity: Not on file  Transportation Needs: Not on file  Physical Activity: Not on file  Stress: Not on file  Social Connections: Not on file  Intimate Partner Violence: Not on file    Family History  Problem Relation Age of Onset   Diabetes Mother    Hypertension Mother    Breast cancer Mother        Age Late 109's   Hypertension Maternal Grandfather     Past Surgical History:  Procedure Laterality Date   CARPAL TUNNEL RELEASE     CO2 LSAER OF VAGINA  07, 11   DIVERTICULUM OF THE BLADDER     OOPHORECTOMY     BSO   TOTAL ABDOMINAL HYSTERECTOMY  2004   BSO    ROS: Review  of Systems Negative except as stated above  PHYSICAL EXAM: BP 139/83 (BP Location: Left Arm, Patient Position: Sitting, Cuff Size: Normal)   Pulse 65   Temp 98.3 F (36.8 C)   Resp 16   Ht 5' (1.524 m)   Wt 140 lb (63.5 kg)   LMP 01/23/2012   SpO2 96%   BMI 27.34 kg/m   Physical Exam HENT:     Head: Normocephalic and atraumatic.  Eyes:     Extraocular Movements: Extraocular movements intact.     Conjunctiva/sclera: Conjunctivae normal.     Pupils: Pupils are equal, round, and reactive to light.  Cardiovascular:     Rate and Rhythm: Normal rate and regular rhythm.     Pulses: Normal pulses.     Heart sounds: Normal heart sounds.  Pulmonary:     Effort: Pulmonary effort is normal.     Breath sounds: Normal breath sounds.  Musculoskeletal:     Cervical back: Normal range of motion and neck supple.  Neurological:     General: No focal deficit present.     Mental Status: She is alert and oriented to person, place, and time.  Psychiatric:        Mood and Affect: Mood normal.        Behavior: Behavior normal.    Results for orders placed or performed in visit on 08/19/22  POCT glycosylated hemoglobin (Hb A1C)  Result Value Ref Range   Hemoglobin A1C 6.1 (A) 4.0 - 5.6 %   HbA1c POC (<> result, manual entry)     HbA1c, POC (prediabetic range)     HbA1c, POC (controlled diabetic range)      ASSESSMENT AND PLAN: 1. Primary hypertension - Continue Amlodipine and Losartan as prescribed. - Counseled on blood pressure goal of less than 140/90, low-sodium, DASH diet, medication compliance, 150 minutes of moderate intensity exercise per week as tolerated. Discussed medication compliance, adverse effects. - Update BMP. - Follow-up with primary provider in 6 months or sooner if needed.  - Basic Metabolic Panel - amLODipine (NORVASC) 10 MG tablet; Take 1 tablet (10 mg total) by mouth daily.  Dispense: 30 tablet; Refill: 2 - losartan (COZAAR) 25 MG tablet; Take 1 tablet (25 mg  total) by mouth daily.  Dispense: 30 tablet; Refill: 2  2. Prediabetes - Hemoglobin A1c remaining consistent with prediabetes. Recheck in 6 months.  - POCT glycosylated  hemoglobin (Hb A1C)  3. Neuropathic pain - Continue Gabapentin as prescribed.  - Follow-up with primary provider as scheduled. - gabapentin (NEURONTIN) 300 MG capsule; Take 1 capsule (300 mg total) by mouth 2 (two) times daily.  Dispense: 60 capsule; Refill: 2  4. Coronary artery calcification 5. Hyperlipidemia, unspecified hyperlipidemia type 6. Cigarette smoker one half pack a day or less - Keep all scheduled appointments at Cardiology.   7. Chronic obstructive pulmonary disease, unspecified COPD type ([place]) 8. Pulmonary emphysema, unspecified emphysema type ([place]) - Patient has contact information to [facility] and states she plans to call and reschedule.   9. Anxiety and depression - Patient denies thoughts of self-harm, suicidal ideations, homicidal ideations. - Referral to Psychiatry for further evaluation and management. Patient requesting [place], [place] office.  - Ambulatory referral to Psychiatry    Patient was given the opportunity to ask questions.  Patient verbalized understanding of the plan and was able to repeat key elements of the plan. Patient was given clear instructions to go to Emergency Department or return to medical center if symptoms don't improve, worsen, or new problems develop.The patient verbalized understanding.   Orders Placed This Encounter  Procedures   Basic Metabolic Panel   Ambulatory referral to Psychiatry   POCT glycosylated hemoglobin (Hb A1C)     Requested Prescriptions   Signed Prescriptions Disp Refills   amLODipine (NORVASC) 10 MG tablet 30 tablet 2    Sig: Take 1 tablet (10 mg total) by mouth daily.   losartan (COZAAR) 25 MG tablet 30 tablet 2    Sig: Take 1 tablet (25 mg total) by mouth daily.   gabapentin (NEURONTIN) 300 MG capsule 60 capsule 2    Sig:  Take 1 capsule (300 mg total) by mouth 2 (two) times daily.    Return in about 6 months (around 02/18/2023) for Follow-Up or next available chronic care mgmt.  Camillia Herter, NP

## 2022-08-11 NOTE — Progress Notes (Unsigned)
Cardiology Office Note:    Date:  08/12/2022   ID:  Brittany Duffy, DOB Mar 18, 1956, MRN [id number]  PCP:  Camillia Herter, NP  Cardiologist:  None   Referring MD: Camillia Herter, NP   Chief Complaint  Patient presents with   Coronary Artery Disease   Hyperlipidemia   Hypertension    History of Present Illness:    Brittany Duffy is a 66 y.o. female with a hx of primary hypertension, coronary calcification, family h/o CAD, prediabetes, and and hyperlipidemia.  Patient smokes cigarettes greater than 1 pack/day for greater than 20 years, has hypertension, prediabetes, and family history of CAD.  Doing well.  She continues to smoke.  She denies cardiac symptoms.  No cough, chest pain, orthopnea, PND, lower extremity swelling.  Past Medical History:  Diagnosis Date   Bronchitis    Dysplasia of cervix, low grade (CIN 1)    S/P CRYO   Fibroid    Hepatitis C    Hypertension    Osteopenia 08/2018   T score -1.9 FRAX 4.7% / 1%   Vaginal intraepithelial neoplasia III (VAIN III)     Past Surgical History:  Procedure Laterality Date   CARPAL TUNNEL RELEASE     CO2 LSAER OF VAGINA  07, 11   DIVERTICULUM OF THE BLADDER     OOPHORECTOMY     BSO   TOTAL ABDOMINAL HYSTERECTOMY  2004   BSO    Current Medications: Current Meds  Medication Sig   ALPRAZolam (XANAX) 0.5 MG tablet Take 0.5 mg by mouth.    amLODipine (NORVASC) 10 MG tablet Take 1 tablet (10 mg total) by mouth daily.   Ascorbic Acid (VITAMIN C PO) Take 1 tablet by mouth daily.    CALCIUM PO Take 1 tablet by mouth daily.    cetirizine (ZYRTEC) 10 MG tablet Take 10 mg by mouth 2 (two) times daily.   Cholecalciferol (VITAMIN D PO) Take by mouth.   Cyanocobalamin (VITAMIN B 12 PO) Take by mouth.   fish oil-omega-3 fatty acids 1000 MG capsule Take 2 g by mouth daily.     fluticasone (FLONASE) 50 MCG/ACT nasal spray Place 2 sprays into both nostrils daily.   gabapentin (NEURONTIN) 300 MG capsule Take 1  capsule (300 mg total) by mouth 2 (two) times daily.   GARLIC PO Take by mouth.   losartan (COZAAR) 25 MG tablet Take 1 tablet (25 mg total) by mouth daily.   MILK THISTLE PO Take by mouth.   Multiple Vitamin (MULTIVITAMIN PO) Take 1 tablet by mouth daily. Reported on 04/28/2016   rosuvastatin (CRESTOR) 20 MG tablet Take 1 tablet (20 mg total) by mouth daily.   VITAMIN A PO Take by mouth.   VITAMIN E PO Take 1 tablet by mouth daily.      Allergies:   Patient has no known allergies.   Social History   Socioeconomic History   Marital status: Single    Spouse name: Not on file   Number of children: Not on file   Years of education: Not on file   Highest education level: Not on file  Occupational History   Not on file  Tobacco Use   Smoking status: Every Day    Packs/day: 1.50    Types: Cigarettes   Smokeless tobacco: Current  Vaping Use   Vaping Use: Never used  Substance and Sexual Activity   Alcohol use: Yes    Alcohol/week: 0.0 standard drinks of alcohol  Comment: occ.   Drug use: No   Sexual activity: Yes    Partners: Male    Birth control/protection: Surgical    Comment: Hyst., INTERCOURSE AGE 26, SEXUAL PARTNERS MORE THAN 5  Other Topics Concern   Not on file  Social History Narrative   Not on file   Social Determinants of Health   Financial Resource Strain: Not on file  Food Insecurity: Not on file  Transportation Needs: Not on file  Physical Activity: Not on file  Stress: Not on file  Social Connections: Not on file     Family History: The patient's family history includes Breast cancer in her mother; Diabetes in her mother; Hypertension in her maternal grandfather and mother.  ROS:   Please see the history of present illness.    No specific complaints.  She is under a lot of stress related to her family and having to take care of her brother.  All other systems reviewed and are negative.  EKGs/Labs/Other Studies Reviewed:    The following studies  were reviewed today:  Chest CT for cancer screening 01/12/2022: IMPRESSION: 1. Lung-RADS 2, benign appearance or behavior. Continue annual screening with low-dose chest CT without contrast in 12 months. 2. Coronary artery calcifications. 3. Diffuse bronchial wall thickening with emphysema, as above; imaging findings suggestive of underlying COPD. 4. Aortic Atherosclerosis (ICD10-I70.0) and Emphysema (ICD10-J43.9).  EKG:  EKG not repeated  Recent Labs: 12/23/2021: BUN 10; Creatinine, Ser 0.78; Hemoglobin 13.7; Platelets 173; Potassium 4.3; Sodium 144; TSH 0.825 03/24/2022: ALT 12  Recent Lipid Panel    Component Value Date/Time   CHOL 118 03/24/2022 1153   TRIG 99 03/24/2022 1153   HDL 53 03/24/2022 1153   CHOLHDL 2.2 03/24/2022 1153   LDLCALC 46 03/24/2022 1153    Physical Exam:    VS:  BP 122/70   Pulse 70   Ht 5' (1.524 m)   Wt 141 lb 6.4 oz (64.1 kg)   LMP 01/23/2012   SpO2 98%   BMI 27.62 kg/m     Wt Readings from Last 3 Encounters:  08/12/22 141 lb 6.4 oz (64.1 kg)  04/21/22 141 lb (64 kg)  02/10/22 148 lb 6.4 oz (67.3 kg)     GEN: Slight decrease in weight since I saw her in April, down 7 pounds.. No acute distress HEENT: Normal NECK: No JVD. LYMPHATICS: No lymphadenopathy CARDIAC: No murmur. RRR no gallop, or edema. VASCULAR:  Normal Pulses. No bruits. RESPIRATORY:  Clear to auscultation without rales, wheezing or rhonchi  ABDOMEN: Soft, non-tender, non-distended, No pulsatile mass, MUSCULOSKELETAL: No deformity  SKIN: Warm and dry NEUROLOGIC:  Alert and oriented x 3 PSYCHIATRIC:  Normal affect   ASSESSMENT:    1. Coronary artery calcification   2. Hyperlipidemia, unspecified hyperlipidemia type   3. Prediabetes   4. Cigarette smoker one half pack a day or less    PLAN:    In order of problems listed above:  Primary risk prevention as outlined and discussed on the prior visit and reiterated on this visit.  Start aspirin 81 mg/day.  Discussed  smoking cessation.  Not ready to commit. Most recent LDL cholesterol is 46 in May 2023 on rosuvastatin 20 mg/day. Prediabetes with most recent hemoglobin A1c 6.1 in February.  Followed by Dr. Glendale Chard. Smoking cessation discussed.   Overall education and awareness concerning secondary risk prevention was discussed in detail: LDL less than 70, hemoglobin A1c less than 7, blood pressure target less than 130/80 mmHg, >150  minutes of moderate aerobic activity per week, avoidance of smoking, weight control (via diet and exercise), and continued surveillance/management of/for obstructive sleep apnea.    Medication Adjustments/Labs and Tests Ordered: Current medicines are reviewed at length with the patient today.  Concerns regarding medicines are outlined above.  No orders of the defined types were placed in this encounter.  No orders of the defined types were placed in this encounter.   There are no Patient Instructions on file for this visit.   Signed, Sinclair Grooms, MD  08/12/2022 3:51 PM    [facility]

## 2022-08-12 ENCOUNTER — Ambulatory Visit: Payer: Medicare Other | Attending: Interventional Cardiology | Admitting: Interventional Cardiology

## 2022-08-12 ENCOUNTER — Encounter: Payer: Self-pay | Admitting: Interventional Cardiology

## 2022-08-12 VITALS — BP 122/70 | HR 70 | Ht 60.0 in | Wt 141.4 lb

## 2022-08-12 DIAGNOSIS — I2510 Atherosclerotic heart disease of native coronary artery without angina pectoris: Secondary | ICD-10-CM

## 2022-08-12 DIAGNOSIS — E785 Hyperlipidemia, unspecified: Secondary | ICD-10-CM | POA: Diagnosis not present

## 2022-08-12 DIAGNOSIS — R7303 Prediabetes: Secondary | ICD-10-CM | POA: Diagnosis not present

## 2022-08-12 DIAGNOSIS — F17210 Nicotine dependence, cigarettes, uncomplicated: Secondary | ICD-10-CM

## 2022-08-12 DIAGNOSIS — I2584 Coronary atherosclerosis due to calcified coronary lesion: Secondary | ICD-10-CM

## 2022-08-12 MED ORDER — ASPIRIN 81 MG PO TBEC: 81.0000 mg | DELAYED_RELEASE_TABLET | Freq: Every day | ORAL | Status: AC

## 2022-08-12 NOTE — Patient Instructions (Signed)
Medication Instructions:  Your physician has recommended you make the following change in your medication:   1) START Aspirin 81 mg daily (over the counter, enteric coated)  *If you need a refill on your cardiac medications before your next appointment, please call your pharmacy*  Lab Work: NONE  Testing/Procedures: NONE  Follow-Up: At [facility], you and your health needs are our priority.  As part of our continuing mission to provide you with exceptional heart care, we have created designated Provider Care Teams.  These Care Teams include your primary Cardiologist (physician) and Advanced Practice Providers (APPs -  Physician Assistants and Nurse Practitioners) who all work together to provide you with the care you need, when you need it.  Your next appointment:   1 year(s)  The format for your next appointment:   In Person  Provider:   Sinclair Grooms, MD  Important Information About Sugar

## 2022-08-19 ENCOUNTER — Ambulatory Visit (INDEPENDENT_AMBULATORY_CARE_PROVIDER_SITE_OTHER): Payer: Medicare Other | Admitting: Family

## 2022-08-19 ENCOUNTER — Encounter: Payer: Self-pay | Admitting: Family

## 2022-08-19 VITALS — BP 139/83 | HR 65 | Temp 98.3°F | Resp 16 | Ht 60.0 in | Wt 140.0 lb

## 2022-08-19 DIAGNOSIS — J449 Chronic obstructive pulmonary disease, unspecified: Secondary | ICD-10-CM

## 2022-08-19 DIAGNOSIS — I2510 Atherosclerotic heart disease of native coronary artery without angina pectoris: Secondary | ICD-10-CM | POA: Diagnosis not present

## 2022-08-19 DIAGNOSIS — J439 Emphysema, unspecified: Secondary | ICD-10-CM

## 2022-08-19 DIAGNOSIS — R7303 Prediabetes: Secondary | ICD-10-CM

## 2022-08-19 DIAGNOSIS — M792 Neuralgia and neuritis, unspecified: Secondary | ICD-10-CM | POA: Diagnosis not present

## 2022-08-19 DIAGNOSIS — I10 Essential (primary) hypertension: Secondary | ICD-10-CM | POA: Diagnosis not present

## 2022-08-19 DIAGNOSIS — I2584 Coronary atherosclerosis due to calcified coronary lesion: Secondary | ICD-10-CM

## 2022-08-19 DIAGNOSIS — E785 Hyperlipidemia, unspecified: Secondary | ICD-10-CM

## 2022-08-19 DIAGNOSIS — F32A Depression, unspecified: Secondary | ICD-10-CM

## 2022-08-19 DIAGNOSIS — F419 Anxiety disorder, unspecified: Secondary | ICD-10-CM

## 2022-08-19 DIAGNOSIS — F17210 Nicotine dependence, cigarettes, uncomplicated: Secondary | ICD-10-CM

## 2022-08-19 LAB — POCT GLYCOSYLATED HEMOGLOBIN (HGB A1C): Hemoglobin A1C: 6.1 % — AB (ref 4.0–5.6)

## 2022-08-19 MED ORDER — AMLODIPINE BESYLATE 10 MG PO TABS: 10.0000 mg | ORAL_TABLET | Freq: Every day | ORAL | 2 refills | Status: DC

## 2022-08-19 MED ORDER — GABAPENTIN 300 MG PO CAPS: 300.0000 mg | ORAL_CAPSULE | Freq: Two times a day (BID) | ORAL | 2 refills | Status: DC

## 2022-08-19 MED ORDER — LOSARTAN POTASSIUM 25 MG PO TABS: 25.0000 mg | ORAL_TABLET | Freq: Every day | ORAL | 2 refills | Status: DC

## 2022-08-19 NOTE — Progress Notes (Signed)
.,  Pt presents for chronic care management   

## 2022-08-20 ENCOUNTER — Other Ambulatory Visit: Payer: Self-pay | Admitting: Family

## 2022-08-20 DIAGNOSIS — J3089 Other allergic rhinitis: Secondary | ICD-10-CM

## 2022-08-20 LAB — BASIC METABOLIC PANEL
BUN/Creatinine Ratio: 16 (ref 12–28)
BUN: 13 mg/dL (ref 8–27)
CO2: 23 mmol/L (ref 20–29)
Calcium: 10.3 mg/dL (ref 8.7–10.3)
Chloride: 107 mmol/L — ABNORMAL HIGH (ref 96–106)
Creatinine, Ser: 0.81 mg/dL (ref 0.57–1.00)
Glucose: 85 mg/dL (ref 70–99)
Potassium: 4.4 mmol/L (ref 3.5–5.2)
Sodium: 146 mmol/L — ABNORMAL HIGH (ref 134–144)
eGFR: 79 mL/min/{1.73_m2} (ref >59)

## 2022-08-20 NOTE — Telephone Encounter (Signed)
Order complete. 

## 2022-08-27 ENCOUNTER — Other Ambulatory Visit: Payer: Self-pay

## 2022-08-27 DIAGNOSIS — J3089 Other allergic rhinitis: Secondary | ICD-10-CM

## 2022-08-27 MED ORDER — FLUTICASONE PROPIONATE 50 MCG/ACT NA SUSP: 2.0000 | Freq: Every day | NASAL | 0 refills | Status: DC

## 2022-09-15 ENCOUNTER — Encounter: Payer: Self-pay | Admitting: Nurse Practitioner

## 2022-09-15 ENCOUNTER — Ambulatory Visit: Payer: Medicare Other | Admitting: Nurse Practitioner

## 2022-09-15 ENCOUNTER — Ambulatory Visit (INDEPENDENT_AMBULATORY_CARE_PROVIDER_SITE_OTHER): Payer: Medicare Other | Admitting: Nurse Practitioner

## 2022-09-15 VITALS — BP 138/72 | HR 87 | Ht 59.5 in | Wt 140.0 lb

## 2022-09-15 DIAGNOSIS — Z9189 Other specified personal risk factors, not elsewhere classified: Secondary | ICD-10-CM

## 2022-09-15 DIAGNOSIS — M8589 Other specified disorders of bone density and structure, multiple sites: Secondary | ICD-10-CM

## 2022-09-15 DIAGNOSIS — Z780 Asymptomatic menopausal state: Secondary | ICD-10-CM

## 2022-09-15 DIAGNOSIS — Z01419 Encounter for gynecological examination (general) (routine) without abnormal findings: Secondary | ICD-10-CM

## 2022-09-15 NOTE — Progress Notes (Signed)
   Brittany Duffy 1955/12/29 [id number]   History:  66 y.o. G2P1011 presents for breast and pelvic exam. No GYN complaints. S/P 2004 TAH BSO for fibroids. No longer on ERT. 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 Osteopenia. Normal mammogram history. HTN, HLD, COPD managed by PCP. Smoker.   Gynecologic History Patient's last menstrual period was 01/23/2012.   Contraception: status post hysterectomy Sexually active: Yes  Health maintenance Last Pap (vaginal): 08/29/2020. Results were: Normal, 3-year repeat Last mammogram: 12/31/2021. Results were: Normal Last colonoscopy: around 9 years per patient. Results were: benign polyps Last Dexa: 10/16/2020. Results were: T-score -1.8, FRAX 4.6% / 1.0%  Past medical history, past surgical history, family history and social history were all reviewed and documented in the EPIC chart. Retired. Son. Mother diagnosed with breast cancer in late 19s.   ROS:  A ROS was performed and pertinent positives and negatives are included.  Exam:  Vitals:   09/15/22 1441  BP: 138/72  Pulse: 87  SpO2: 97%  Weight: 140 lb (63.5 kg)  Height: 4' 11.5" (1.511 m)     Body mass index is 27.8 kg/m.  General appearance:  Normal Thyroid:  Symmetrical, normal in size, without palpable masses or nodularity. Respiratory  Auscultation:  Clear without wheezing or rhonchi Cardiovascular  Auscultation:  Regular rate, without rubs, murmurs or gallops  Edema/varicosities:  Not grossly evident Abdominal  Soft,nontender, without masses, guarding or rebound.  Liver/spleen:  No organomegaly noted  Hernia:  None appreciated  Skin  Inspection:  Grossly normal   Breasts: Examined lying and sitting.   Right: Without masses, retractions, discharge or axillary adenopathy.   Left: Without masses, retractions, discharge or axillary adenopathy. Genitourinary   Inguinal/mons:  Normal without inguinal adenopathy  External  genitalia:  Normal appearing vulva with no masses, tenderness, or lesions  BUS/Urethra/Skene's glands:  Normal  Vagina:  Normal appearing with normal color and discharge, no lesions, Atrophic changes  Cervix:  and uterus absent  Adnexa/parametria:     Rt: Normal in size, without masses or tenderness.   Lt: Normal in size, without masses or tenderness.  Anus and perineum: Normal  Digital rectal exam: Normal sphincter tone without palpated masses or tenderness  Patient informed chaperone available to be present for breast and pelvic exam. Patient has requested no chaperone to be present. Patient has been advised what will be completed during breast and pelvic exam.   Assessment/Plan:  66 y.o. G2P1011 for breast and pelvic exam.   Well female exam with routine gynecological exam - Education provided on SBEs, importance of preventative screenings, current guidelines, high calcium diet, regular exercise, and multivitamin daily.  Labs with PCP.   Postmenopausal - Stopped ERT a couple of years ago, doing fine.   Osteopenia of multiple sites - T-score -1.8. Continue Vitamin D and Calcium supplements and increase exercise. Due for DXA in December and will schedule now.   Screening for cervical cancer - 2011 VAIN 3 with laser Co2, cryosurgery many years ago for CIN-1 subsequent paps normal.  Will repeat vaginal pap at 3-year interval.   Screening for breast cancer - Normal mammogram history.  Continue annual screenings. Normal breast exam today.  Screening for colon cancer - Colonoscopy around 9 years ago. Due next year.   Follow up in 1 year for breast and pelvic exam.      Tamela Gammon [facility], 3:09 PM 09/15/2022

## 2022-09-15 NOTE — Progress Notes (Deleted)
Brittany Duffy [birth year] [id number]   History:  66 y.o. G2P1011 presents for breast and pelvic exam. No GYN complaints. S/P 2004 TAH BSO for fibroids. No longer on ERT. 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 Osteopenia. Normal mammogram history. HTN, HLD managed by PCP.   Gynecologic History Patient's last menstrual period was 01/23/2012.   Contraception: status post hysterectomy Sexually active: ***  Health maintenance Last Pap (vaginal): 08/29/2020. Results were: Normal, 3-year repeat Last mammogram: 12/31/2021. Results were: Normal Last colonoscopy: around 10 years per patient Results were: benign polyps Last Dexa: 10/16/2020. Results were: T-score -1.8, FRAX 4.6% / 1.0%  Past medical history, past surgical history, family history and social history were all reviewed and documented in the EPIC chart. Retired. Son. Mother diagnosed with breast cancer in late 15s.   ROS:  A ROS was performed and pertinent positives and negatives are included.  Exam:  There were no vitals filed for this visit.   There is no height or weight on file to calculate BMI.  General appearance:  Normal Thyroid:  Symmetrical, normal in size, without palpable masses or nodularity. Respiratory  Auscultation:  Clear without wheezing or rhonchi Cardiovascular  Auscultation:  Regular rate, without rubs, murmurs or gallops  Edema/varicosities:  Not grossly evident Abdominal  Soft,nontender, without masses, guarding or rebound.  Liver/spleen:  No organomegaly noted  Hernia:  None appreciated  Skin  Inspection:  Grossly normal   Breasts: Examined lying and sitting.   Right: Without masses, retractions, discharge or axillary adenopathy.   Left: Without masses, retractions, discharge or axillary adenopathy. Genitourinary   Inguinal/mons:  Normal without inguinal adenopathy  External genitalia:  Normal appearing vulva with no masses, tenderness, or  lesions  BUS/Urethra/Skene's glands:  Normal  Vagina:  Normal appearing with normal color and discharge, no lesions  Cervix:  and uterus absent  Adnexa/parametria:     Rt: Normal in size, without masses or tenderness.   Lt: Normal in size, without masses or tenderness.  Anus and perineum: Normal  Digital rectal exam: Normal sphincter tone without palpated masses or tenderness  Patient informed chaperone available to be present for breast and pelvic exam. Patient has requested no chaperone to be present. Patient has been advised what will be completed during breast and pelvic exam.   Assessment/Plan:  66 y.o. G2P1011 for breast and pelvic exam.   Well female exam with routine gynecological exam - Education provided on SBEs, importance of preventative screenings, current guidelines, high calcium diet, regular exercise, and multivitamin daily.  Labs with PCP.   Postmenopausal - ran out of [place] patches and has been doing fine. She was asking to restart. We discussed risk of continued use and mild symptoms and she is agreeable to remain off of them.   Osteopenia of multiple sites - T-score -1.8. Continue Vitamin D and Calcium supplements and increase exercise. Will repeat DXA at 2-year interval per recommendation.   Screening for cervical cancer - 2011 VAIN 3 with laser Co2, cryosurgery many years ago for CIN-1 subsequent paps normal.  Will repeat vaginal pap at 3-year interval.   Screening for breast cancer - Normal mammogram history.  Overdue and encouraged to schedule soon. Normal breast exam today.  Screening for colon cancer - Believes her last colonoscopy was about 10 years ago. Recommend calling GI to find out when she is due.  Follow up in 1 year for annual.      [place],  12:04 PM 09/15/2022

## 2022-09-23 ENCOUNTER — Encounter (HOSPITAL_COMMUNITY): Payer: Self-pay | Admitting: Psychiatry

## 2022-09-23 ENCOUNTER — Ambulatory Visit (HOSPITAL_BASED_OUTPATIENT_CLINIC_OR_DEPARTMENT_OTHER): Payer: Medicare Other | Admitting: Psychiatry

## 2022-09-23 DIAGNOSIS — F411 Generalized anxiety disorder: Secondary | ICD-10-CM | POA: Diagnosis not present

## 2022-09-23 NOTE — Progress Notes (Signed)
Psychiatric Initial Adult Assessment   Patient Identification: Brittany Duffy MRN:  [id number] Date of Evaluation:  09/23/2022 Referral Source: PCP Chief Complaint:   Chief Complaint  Patient presents with   Establish Care   Visit Diagnosis:    ICD-10-CM   1. Anxiety state  F41.1        Assessment:  Brittany Duffy is a 66 y.o. y.o. female with a history of prediabetes and HLD who presents in person to [place] [facility] for initial evaluation on 09/23/2022.    Patient reports symptoms of anxiety including feeling nervous or on edge, feelings of being overwhelmed, fatigue, and difficulty concentrating.  Patient reports that these tend to be related to situational stressors, though notes that she has experienced symptoms similar to this her whole life.  Patient denies any SI/HI or thoughts of self-harm, depression, mania, AVH, psychosis, paranoia, or delusions.  She also denied any symptoms consistent with PTSD.  Psychosocially patient endorses increased stress from caring for her schizophrenic brother.  At this time she meets criteria for an anxiety state.  Of note she has been on a regimen of Xanax 0.5 mg twice a day as needed in the past for several decades to manage her symptoms.  We went over the long-term adverse side effects of benzodiazepines and potential alternatives such as medications or therapy.  Patient declined those at this time.  She is welcome to return in the future if thoughts are to change.  Plan: - Discussed the long term adverse effects of benzodiazepines and alternative medications, patient declined alternatives at this time - Gabapentin 300 mg BID for neuropathic pain managed by her PCP - CMP. CBC, lipid profile, A1c, and TSH reviewed - Crisis resources reviewed - Follow up in the future as needed  History of Present Illness:  Brittany Duffy presents for initial eval after referral by her PCP.  She notes that she had transitioned  to a new primary care practitioner around 2 years ago now.  Prior to that her psychiatric care was managed by her previous provider.  Brittany Duffy notes that she has been on a regimen of Xanax 0.5 mg twice a day as needed for several decades now.  During which time she would take the medication of variable schedules.  Typically she would take it once a day with a second dose as needed, however there were times she would go days to weeks without taking the medication.  She notes that the Xanax was helpful in controlling her mood and anxiety symptoms.  Brittany Duffy reports that her symptoms tend to be related to stressors in her life and she has been stressed as long as she can remember.  She notes that she is currently caring for her brother who has a diagnosis of schizophrenia.  She notes being busy managing all his appointments and keeping him well controlled.  Patient does enjoy this but can find it overwhelming at times.  She notes that she has another brother who is a good support and has been caring for her schizophrenic brother prior to her taking over.  Since changing to her new primary care doctor she has been off the Xanax.  Patient discontinued the medication around 2021 and notes that she did not experience any withdrawal symptoms after discontinuing the medication. Patient notes that she would take the medication at varying schedules/frequencies.  The most common being once a day in the morning with the second 1 being used as needed during periods of increased  stress.  However there were times when she would go without taking Xanax for several days to weeks.  We discussed the long-term adverse side effects of these medications and the benefit of considering alternatives.  Discussed options such as medications or therapy however patient was not interested in those at this time.   Associated Signs/Symptoms: Depression Symptoms:  anxiety, (Hypo) Manic Symptoms:   denies Anxiety Symptoms:  Excessive  Worry, Psychotic Symptoms:   Denies PTSD Symptoms: NA  Past Psychiatric History: Patient denies any prior SI/HI or self harm. She denies any hx of psychosis, delusions, or paranoia. She has no prior psychiatric hospitalizations and has not been connected with a psychiatric provider in the past. Patient had been prescribed Xanax 0.5 mg BID prn for anxiety by her former PCP which she would take as needed for decades. Often she would take one a day with the second one being situational. There were times though where she would not take any for days to weeks. Patient has been off the medication since she changed primary's in 2021. She denies experiencing withdrawal after discontinuing the Xanax. Patient denies trying any other psychiatric medications. She does take gabapentin for neuropathic pain.  Smokes cigarettes 1 PPD in the process of trying to quit. Denies other substance use.  Previous Psychotropic Medications: Yes   Substance Abuse History in the last 12 months:  No.  Consequences of Substance Abuse: NA  Past Medical History:  Past Medical History:  Diagnosis Date   Bronchitis    Dysplasia of cervix, low grade (CIN 1)    S/P CRYO   Fibroid    Hepatitis C    Hypertension    Osteopenia 08/2018   T score -1.9 FRAX 4.7% / 1%   Vaginal intraepithelial neoplasia III (VAIN III)     Past Surgical History:  Procedure Laterality Date   CARPAL TUNNEL RELEASE     CO2 LSAER OF VAGINA  07, 11   DIVERTICULUM OF THE BLADDER     OOPHORECTOMY     BSO   TOTAL ABDOMINAL HYSTERECTOMY  2004   BSO    Family Psychiatric History: Patient has a brother with Schizophrenia  Family History:  Family History  Problem Relation Age of Onset   Diabetes Mother    Hypertension Mother    Breast cancer Mother        Age Late 2's   Hypertension Maternal Grandfather     Social History:   Social History   Socioeconomic History   Marital status: Single    Spouse name: Not on file   Number of  children: Not on file   Years of education: Not on file   Highest education level: Not on file  Occupational History   Not on file  Tobacco Use   Smoking status: Every Day    Packs/day: 1.00    Types: Cigarettes    Passive exposure: Current   Smokeless tobacco: Current  Vaping Use   Vaping Use: Never used  Substance and Sexual Activity   Alcohol use: Yes    Comment: occassional   Drug use: No   Sexual activity: Yes    Partners: Male    Birth control/protection: Surgical    Comment: Hyst., INTERCOURSE AGE 78, SEXUAL PARTNERS MORE THAN 5  Other Topics Concern   Not on file  Social History Narrative   Not on file   Social Determinants of Health   Financial Resource Strain: Not on file  Food Insecurity: Not on file  Transportation Needs: Not on file  Physical Activity: Not on file  Stress: Not on file  Social Connections: Not on file    Additional Social History: Patient lives with her schizophrenic brother who she cares for. She has another brother whom she is close with. Patient notes that her mother passed away several years ago.  Allergies:  No Known Allergies  Metabolic Disorder Labs: Lab Results  Component Value Date   HGBA1C 6.1 (A) 08/19/2022   No results found for: "PROLACTIN" Lab Results  Component Value Date   CHOL 118 03/24/2022   TRIG 99 03/24/2022   HDL 53 03/24/2022   CHOLHDL 2.2 03/24/2022   LDLCALC 46 03/24/2022   LDLCALC 129 (H) 05/09/2021   Lab Results  Component Value Date   TSH 0.825 12/23/2021    Therapeutic Level Labs: No results found for: "LITHIUM" No results found for: "CBMZ" No results found for: "VALPROATE"  Current Medications: Current Outpatient Medications  Medication Sig Dispense Refill   ALPRAZolam (XANAX) 0.5 MG tablet Take 0.5 mg by mouth.   1   amLODipine (NORVASC) 10 MG tablet Take 1 tablet (10 mg total) by mouth daily. 30 tablet 2   Ascorbic Acid (VITAMIN C PO) Take 1 tablet by mouth daily.      aspirin EC 81 MG  tablet Take 1 tablet (81 mg total) by mouth daily. Swallow whole.     CALCIUM PO Take 1 tablet by mouth daily.      cetirizine (ZYRTEC) 10 MG tablet Take 10 mg by mouth 2 (two) times daily.     Cholecalciferol (VITAMIN D PO) Take by mouth.     Cyanocobalamin (VITAMIN B 12 PO) Take by mouth.     fish oil-omega-3 fatty acids 1000 MG capsule Take 2 g by mouth daily.       fluticasone (FLONASE) 50 MCG/ACT nasal spray Place 2 sprays into both nostrils daily. 48 g 0   gabapentin (NEURONTIN) 300 MG capsule Take 1 capsule (300 mg total) by mouth 2 (two) times daily. 60 capsule 2   GARLIC PO Take by mouth.     losartan (COZAAR) 25 MG tablet Take 1 tablet (25 mg total) by mouth daily. 30 tablet 2   MILK THISTLE PO Take by mouth.     Multiple Vitamin (MULTIVITAMIN PO) Take 1 tablet by mouth daily. Reported on 04/28/2016     rosuvastatin (CRESTOR) 20 MG tablet Take 1 tablet (20 mg total) by mouth daily. 90 tablet 2   VITAMIN A PO Take by mouth.     VITAMIN E PO Take 1 tablet by mouth daily.      No current facility-administered medications for this visit.    Musculoskeletal: Strength & Muscle Tone: within normal limits Gait & Station: normal Patient leans: N/A  Psychiatric Specialty Exam: Review of Systems  Last menstrual period 01/23/2012.There is no height or weight on file to calculate BMI.  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  ADL's:  Intact  Cognition: WNL  Sleep:  Good   Screenings: GAD-7    Flowsheet Row Office Visit from 08/27/2021 in Primary Care at [facility]  Total GAD-7 Score 5      PHQ2-9    [place] Visit from 04/21/2022 in [place] at [facility] Visit from 12/23/2021 in [place] at [facility] Visit from 08/27/2021 in [place] at [facility] Visit from 05/09/2021 in [place] at [facility] from 04/08/2021 in Primary Care at [facility] Total Score 0 1 2 0 0  PHQ-9 Total Score -- -- 5 0 0      [place] Office Visit from 09/23/2022 in [facility]  C-SSRS RISK CATEGORY No Risk        Collaboration of Care: Primary Care Provider AEB chart review and Other provider involved in patient's care [place] cardiology chart review  Patient/Guardian was advised Release of Information must be obtained prior to any record release in order to collaborate their care with an outside provider. Patient/Guardian was advised if they have not already done so to contact the registration department to sign all necessary forms in order for [place] to release information regarding their care.   Consent: Patient/Guardian gives verbal consent for treatment and assignment of benefits for services provided during this visit. Patient/Guardian expressed understanding and agreed to proceed.   Vista Mink, MD 11/15/20232:30 PM

## 2022-10-20 ENCOUNTER — Other Ambulatory Visit: Payer: Self-pay | Admitting: Nurse Practitioner

## 2022-10-20 ENCOUNTER — Ambulatory Visit (INDEPENDENT_AMBULATORY_CARE_PROVIDER_SITE_OTHER): Payer: Medicare Other

## 2022-10-20 DIAGNOSIS — M8589 Other specified disorders of bone density and structure, multiple sites: Secondary | ICD-10-CM

## 2022-10-20 DIAGNOSIS — Z780 Asymptomatic menopausal state: Secondary | ICD-10-CM

## 2022-10-20 DIAGNOSIS — Z13820 Encounter for screening for osteoporosis: Secondary | ICD-10-CM | POA: Diagnosis not present

## 2022-10-20 DIAGNOSIS — Z9189 Other specified personal risk factors, not elsewhere classified: Secondary | ICD-10-CM

## 2022-10-20 DIAGNOSIS — Z01419 Encounter for gynecological examination (general) (routine) without abnormal findings: Secondary | ICD-10-CM

## 2022-12-09 ENCOUNTER — Other Ambulatory Visit: Payer: Self-pay | Admitting: Family

## 2022-12-09 DIAGNOSIS — Z1231 Encounter for screening mammogram for malignant neoplasm of breast: Secondary | ICD-10-CM

## 2023-01-26 ENCOUNTER — Ambulatory Visit: Payer: Medicare Other

## 2023-02-10 ENCOUNTER — Telehealth: Payer: Self-pay | Admitting: Family

## 2023-02-10 NOTE — Telephone Encounter (Signed)
Copied from [place] [id number]. Topic: Appointment Scheduling - Scheduling Inquiry for Clinic >> Feb 10, 2023 10:26 AM Sab[place] Sous wrote: Reason for CRM: Pt wants to reschedule her [place], please advise if Thursday 02/18/2023 is available. She prefers anytime after 12. Even says she can do after 12 pm on Monday 02/22/2023

## 2023-02-17 ENCOUNTER — Encounter: Payer: Medicare Other | Admitting: Family

## 2023-02-17 DIAGNOSIS — Z130 Encounter for screening for diseases of the blood and blood-forming organs and certain disorders involving the immune mechanism: Secondary | ICD-10-CM

## 2023-02-17 DIAGNOSIS — Z113 Encounter for screening for infections with a predominantly sexual mode of transmission: Secondary | ICD-10-CM

## 2023-02-17 DIAGNOSIS — Z0000 Encounter for general adult medical examination without abnormal findings: Secondary | ICD-10-CM

## 2023-02-17 DIAGNOSIS — M792 Neuralgia and neuritis, unspecified: Secondary | ICD-10-CM

## 2023-02-17 DIAGNOSIS — Z124 Encounter for screening for malignant neoplasm of cervix: Secondary | ICD-10-CM

## 2023-02-17 DIAGNOSIS — Z1211 Encounter for screening for malignant neoplasm of colon: Secondary | ICD-10-CM

## 2023-02-17 DIAGNOSIS — R7303 Prediabetes: Secondary | ICD-10-CM

## 2023-02-17 DIAGNOSIS — Z122 Encounter for screening for malignant neoplasm of respiratory organs: Secondary | ICD-10-CM

## 2023-02-17 DIAGNOSIS — Z1329 Encounter for screening for other suspected endocrine disorder: Secondary | ICD-10-CM

## 2023-02-17 DIAGNOSIS — I10 Essential (primary) hypertension: Secondary | ICD-10-CM

## 2023-02-17 DIAGNOSIS — Z13228 Encounter for screening for other metabolic disorders: Secondary | ICD-10-CM

## 2023-02-17 DIAGNOSIS — Z1231 Encounter for screening mammogram for malignant neoplasm of breast: Secondary | ICD-10-CM

## 2023-02-19 ENCOUNTER — Ambulatory Visit (INDEPENDENT_AMBULATORY_CARE_PROVIDER_SITE_OTHER): Payer: Medicare Other | Admitting: Family

## 2023-02-19 ENCOUNTER — Encounter: Payer: Self-pay | Admitting: Family

## 2023-02-19 VITALS — BP 144/78 | HR 87 | Temp 98.6°F | Resp 14 | Ht 60.0 in | Wt 145.8 lb

## 2023-02-19 DIAGNOSIS — F17210 Nicotine dependence, cigarettes, uncomplicated: Secondary | ICD-10-CM

## 2023-02-19 DIAGNOSIS — Z1211 Encounter for screening for malignant neoplasm of colon: Secondary | ICD-10-CM

## 2023-02-19 DIAGNOSIS — Z130 Encounter for screening for diseases of the blood and blood-forming organs and certain disorders involving the immune mechanism: Secondary | ICD-10-CM | POA: Diagnosis not present

## 2023-02-19 DIAGNOSIS — Z122 Encounter for screening for malignant neoplasm of respiratory organs: Secondary | ICD-10-CM

## 2023-02-19 DIAGNOSIS — R7303 Prediabetes: Secondary | ICD-10-CM

## 2023-02-19 DIAGNOSIS — I10 Essential (primary) hypertension: Secondary | ICD-10-CM | POA: Diagnosis not present

## 2023-02-19 DIAGNOSIS — Z1329 Encounter for screening for other suspected endocrine disorder: Secondary | ICD-10-CM | POA: Diagnosis not present

## 2023-02-19 DIAGNOSIS — Z0000 Encounter for general adult medical examination without abnormal findings: Secondary | ICD-10-CM

## 2023-02-19 DIAGNOSIS — Z124 Encounter for screening for malignant neoplasm of cervix: Secondary | ICD-10-CM

## 2023-02-19 DIAGNOSIS — Z0001 Encounter for general adult medical examination with abnormal findings: Secondary | ICD-10-CM

## 2023-02-19 DIAGNOSIS — Z1231 Encounter for screening mammogram for malignant neoplasm of breast: Secondary | ICD-10-CM

## 2023-02-19 DIAGNOSIS — M792 Neuralgia and neuritis, unspecified: Secondary | ICD-10-CM

## 2023-02-19 DIAGNOSIS — Z113 Encounter for screening for infections with a predominantly sexual mode of transmission: Secondary | ICD-10-CM

## 2023-02-19 LAB — POCT GLYCOSYLATED HEMOGLOBIN (HGB A1C): Hemoglobin A1C: 5.4 % (ref 4.0–5.6)

## 2023-02-19 MED ORDER — LOSARTAN POTASSIUM 25 MG PO TABS
25.0000 mg | ORAL_TABLET | Freq: Every day | ORAL | 0 refills | Status: DC
Start: 2023-02-19 — End: 2023-03-24

## 2023-02-19 NOTE — Progress Notes (Signed)
Pt is here for medicare wellness exam.

## 2023-02-19 NOTE — Progress Notes (Signed)
See completed note

## 2023-02-19 NOTE — Progress Notes (Signed)
Subjective:   Brittany Duffy is a 67 y.o. female who presents for Medicare Annual (Subsequent) preventive examination.  Review of Systems    - Doing well on Losartan for high blood pressure management, no issues/concerns. Reports she is not taking Amlodipine per her preference. She is trying to monitor what she eats. She denies red flag symptoms such as but not limited to chest pain, shortness of breath, worst headache of life, nausea/vomiting.  - Established with Cardiology for management of coronary artery calcification and hyperlipidemia.  - Reports she is established with Gynecology for women's health maintenance.  - Established with Pulmonology for management of COPD and emphysema. - Established with Psychiatry. - Patient does not have any issues/concerns for discussion today.   Objective:    Today's Vitals   02/19/23 1549 02/19/23 1630  BP: (!) 147/81 (!) 144/78  Pulse: 87   Resp: 14   Temp: 98.6 F (37 C)   SpO2: 97%   Weight: 145 lb 12.8 oz (66.1 kg)   Height: 5' (1.524 m)   PainSc: 0-No pain    Body mass index is 28.47 kg/m.  Physical Exam HENT:     Head: Normocephalic and atraumatic.  Eyes:     Extraocular Movements: Extraocular movements intact.     Conjunctiva/sclera: Conjunctivae normal.     Pupils: Pupils are equal, round, and reactive to light.  Cardiovascular:     Rate and Rhythm: Normal rate and regular rhythm.     Pulses: Normal pulses.     Heart sounds: Normal heart sounds.  Pulmonary:     Effort: Pulmonary effort is normal.     Breath sounds: Normal breath sounds.  Musculoskeletal:     Cervical back: Normal range of motion and neck supple.  Neurological:     General: No focal deficit present.     Mental Status: She is alert and oriented to person, place, and time.  Psychiatric:        Mood and Affect: Mood normal.        Behavior: Behavior normal.    Results for orders placed or performed in visit on 02/19/23  POCT glycosylated  hemoglobin (Hb A1C)  Result Value Ref Range   Hemoglobin A1C 5.4 4.0 - 5.6 %   HbA1c POC (<> result, manual entry)     HbA1c, POC (prediabetic range)     HbA1c, POC (controlled diabetic range)         11/11/2015   11:06 PM  Advanced Directives  Does Patient Have a Medical Advance Directive? No    Current Medications (verified) Outpatient Encounter Medications as of 02/19/2023  Medication Sig   rosuvastatin (CRESTOR) 20 MG tablet Take 1 tablet (20 mg total) by mouth daily.   ALPRAZolam (XANAX) 0.5 MG tablet Take 0.5 mg by mouth.    Ascorbic Acid (VITAMIN C PO) Take 1 tablet by mouth daily.    aspirin EC 81 MG tablet Take 1 tablet (81 mg total) by mouth daily. Swallow whole.   CALCIUM PO Take 1 tablet by mouth daily.    cetirizine (ZYRTEC) 10 MG tablet Take 10 mg by mouth 2 (two) times daily.   Cholecalciferol (VITAMIN D PO) Take by mouth.   Cyanocobalamin (VITAMIN B 12 PO) Take by mouth.   fish oil-omega-3 fatty acids 1000 MG capsule Take 2 g by mouth daily.     GARLIC PO Take by mouth.   losartan (COZAAR) 25 MG tablet Take 1 tablet (25 mg total) by mouth daily.  MILK THISTLE PO Take by mouth.   Multiple Vitamin (MULTIVITAMIN PO) Take 1 tablet by mouth daily. Reported on 04/28/2016   VITAMIN A PO Take by mouth.   VITAMIN E PO Take 1 tablet by mouth daily.    [DISCONTINUED] amLODipine (NORVASC) 10 MG tablet Take 1 tablet (10 mg total) by mouth daily.   [DISCONTINUED] fluticasone (FLONASE) 50 MCG/ACT nasal spray Place 2 sprays into both nostrils daily.   [DISCONTINUED] gabapentin (NEURONTIN) 300 MG capsule Take 1 capsule (300 mg total) by mouth 2 (two) times daily.   [DISCONTINUED] losartan (COZAAR) 25 MG tablet Take 1 tablet (25 mg total) by mouth daily.   No facility-administered encounter medications on file as of 02/19/2023.    Allergies (verified) Patient has no known allergies.   History: Past Medical History:  Diagnosis Date   Bronchitis    Dysplasia of cervix, low  grade (CIN 1)    S/P CRYO   Fibroid    Hepatitis C    Hypertension    Osteopenia 08/2018   T score -1.9 FRAX 4.7% / 1%   Vaginal intraepithelial neoplasia III (VAIN III)    Past Surgical History:  Procedure Laterality Date   CARPAL TUNNEL RELEASE     CO2 LSAER OF VAGINA  07, 11   DIVERTICULUM OF THE BLADDER     OOPHORECTOMY     BSO   TOTAL ABDOMINAL HYSTERECTOMY  2004   BSO   Family History  Problem Relation Age of Onset   Diabetes Mother    Hypertension Mother    Breast cancer Mother        Age Late 31's   Hypertension Maternal Grandfather    Social History   Socioeconomic History   Marital status: Single    Spouse name: Not on file   Number of children: Not on file   Years of education: Not on file   Highest education level: Not on file  Occupational History   Not on file  Tobacco Use   Smoking status: Every Day    Packs/day: 1    Types: Cigarettes    Passive exposure: Current   Smokeless tobacco: Current  Vaping Use   Vaping Use: Never used  Substance and Sexual Activity   Alcohol use: Yes    Comment: occassional   Drug use: No   Sexual activity: Yes    Partners: Male    Birth control/protection: Surgical    Comment: Hyst., INTERCOURSE AGE 75, SEXUAL PARTNERS MORE THAN 5  Other Topics Concern   Not on file  Social History Narrative   Not on file   Social Determinants of Health   Financial Resource Strain: Not on file  Food Insecurity: Not on file  Transportation Needs: Not on file  Physical Activity: Not on file  Stress: Not on file  Social Connections: Not on file    Tobacco Counseling Patient is a smoker. Counseled on smoking cessation.   Clinical Intake:  Pre-visit preparation completed: Yes  Pain : 0-10 Pain Score: 0-No pain  Diabetic? No   Interpreter Needed?: No  Activities of Daily Living - does not require assistance.   Patient Care Team: Ricky Stabs, NP as PCP - General (Family Medicine) Verdis Prime, MD as Consulting  Physician (Cardiology)  Indicate any recent Medical Services you may have received from other than [facility] providers in the past year (date may be approximate).  Assessment:   This is a routine wellness examination for [name].  Hearing/Vision screen - Hearing normal. -  Established with Ophthalmology.   Dietary issues and exercise activities discussed: Yes. Patient reports she is trying to monitor what she eats. She does not exercise outside of her normal routine.  Goals Addressed: "Quit smoking." I discussed smoking cessation with patient. Patient declined pharmacological therapy as of present.  Depression Screen    04/21/2022    1:58 PM 12/23/2021    2:20 PM 08/27/2021    4:30 PM 05/09/2021    2:29 PM 04/08/2021    4:05 PM  PHQ 2/9 Scores  PHQ - 2 Score 0 1 2 0 0  PHQ- 9 Score   5 0 0    Fall Risk    02/19/2023    3:52 PM 08/27/2021    4:30 PM 04/08/2021    4:05 PM  Fall Risk   Falls in the past year? 0 0 0  Number falls in past yr: 0 0 0  Injury with Fall? 0 0 0  Risk for fall due to :  No Fall Risks No Fall Risks  Follow up  Falls evaluation completed Falls evaluation completed    FALL RISK PREVENTION PERTAINING TO THE HOME: Any stairs in or around the home? No  If so, are there any without handrails? No  Home free of loose throw rugs in walkways, pet beds, electrical cords, etc? No  Adequate lighting in your home to reduce risk of falls? Yes   ASSISTIVE DEVICES UTILIZED TO PREVENT FALLS: Life alert? No  Use of a cane, walker or w/c? No  Grab bars in the bathroom? No  Shower chair or bench in shower? No  Elevated toilet seat or a handicapped toilet? No   TIMED UP AND GO: Was the test performed? Yes .  Length of time to ambulate 10 feet: 5 sec.   Gait steady and fast without use of assistive device   Immunizations Immunization History  Administered Date(s) Administered   Influenza,inj,Quad PF,6+ Mos 09/14/2016, 09/29/2018, 08/27/2020, 08/27/2021   Tdap  11/12/2015    TDAP status: Up to date  Flu Vaccine status: Up to date  Pneumococcal vaccine status: Declined,  Education has been provided regarding the importance of this vaccine but patient still declined. Advised may receive this vaccine at local pharmacy or Health Dept. Aware to provide a copy of the vaccination record if obtained from local pharmacy or Health Dept. Verbalized acceptance and understanding.   Covid-19 vaccine status: Information provided on how to obtain vaccines.   Qualifies for Shingles Vaccine? Yes   Zostavax completed Yes   Shingrix Completed?: Yes  Screening Tests Health Maintenance  Topic Date Due   COVID-19 Vaccine (1) Never done   COLONOSCOPY (Pts 45-46yrs Insurance coverage will need to be confirmed)  Never done   Zoster Vaccines- Shingrix (1 of 2) Never done   PAP SMEAR-Modifier  08/27/2021   Lung Cancer Screening  01/13/2023   Pneumonia Vaccine 54+ Years old (1 of 2 - PCV) 08/27/2023 (Originally 07/25/1962)   INFLUENZA VACCINE  06/10/2023   MAMMOGRAM  01/01/2024   Medicare Annual Wellness (AWV)  02/19/2024   DTaP/Tdap/Td (2 - Td or Tdap) 11/11/2025   DEXA SCAN  Completed   Hepatitis C Screening  Completed   HPV VACCINES  Aged Out    Health Maintenance  Health Maintenance Due  Topic Date Due   COVID-19 Vaccine (1) Never done   COLONOSCOPY (Pts 45-57yrs Insurance coverage will need to be confirmed)  Never done   Zoster Vaccines- Shingrix (1 of 2) Never done  PAP SMEAR-Modifier  08/27/2021   Lung Cancer Screening  01/13/2023    Colorectal cancer screening: Ordered 02/19/2023.  Mammogram status: Ordered 02/19/2023.  Bone Density status: Completed 10/20/2022.   Lung Cancer Screening: (Low Dose CT Chest recommended if Age 78-80 years, 30 pack-year currently smoking OR have quit w/in 15years.) does qualify.   Lung Cancer Screening Referral: Ordered 02/19/2023  Additional Screening:  Hepatitis C Screening: does not qualify; Completed  02/10/2022  Vision Screening: Recommended annual ophthalmology exams for early detection of glaucoma and other disorders of the eye. Is the patient up to date with their annual eye exam?  Yes  Who is the provider or what is the name of the office in which the patient attends annual eye exams?  Dr Fredna Dow  If pt is not established with a provider, would they like to be referred to a provider to establish care?   Dental Screening: Recommended annual dental exams for proper oral hygiene  Community Resource Referral / Chronic Care Management: CRR required this visit?  No   CCM required this visit?  No    Plan:  1. Medicare annual wellness visit, subsequent - Counseled on 150 minutes of exercise per week as tolerated, healthy eating (including decreased daily intake of saturated fats, cholesterol, added sugars, sodium), STI prevention, and routine healthcare maintenance.  2. Screening for deficiency anemia - Routine screening.  - CBC  3. Thyroid disorder screen - Routine screening.  - TSH  4. Encounter for screening mammogram for malignant neoplasm of breast - Routine screening.  - MM Digital Screening; Future  5. Screening for lung cancer - Routine screening.  - CT CHEST LUNG CA SCREEN LOW DOSE W/O CM; Future  6. Colon cancer screening - Referral to Gastroenterology for further evaluation/management.  - Ambulatory referral to Gastroenterology  7. Pap smear for cervical cancer screening 8. Routine screening for STI (sexually transmitted infection) - Keep all scheduled appointments with Gynecology.   9. Primary hypertension - Continue Losartan as prescribed.  - Patient self-discontinued Amlodipine without explanation.  - Routine screening.  - Counseled on blood pressure goal of less than 140/90, low-sodium, DASH diet, medication compliance, 150 minutes of moderate intensity exercise per week as tolerated. Discussed medication compliance, adverse effects. - Follow-up with  primary provider in 3 months or sooner if needed.  - Basic Metabolic Panel - losartan (COZAAR) 25 MG tablet; Take 1 tablet (25 mg total) by mouth daily.  Dispense: 90 tablet; Refill: 0  10. Prediabetes - Hemoglobin A1c 5.4%. Patient no longer in prediabetic range.  - Recheck routinely. - POCT glycosylated hemoglobin (Hb A1C)   I have personally reviewed and noted the following in the patient's chart:   Medical and social history Use of alcohol, tobacco or illicit drugs  Current medications and supplements including opioid prescriptions. Patient is not currently taking opioid prescriptions. Functional ability and status Nutritional status Physical activity Advanced directives List of other physicians Hospitalizations, surgeries, and ER visits in previous 12 months Vitals Screenings to include cognitive, depression, and falls Referrals and appointments  In addition, I have reviewed and discussed with patient certain preventive protocols, quality metrics, and best practice recommendations. A written personalized care plan for preventive services as well as general preventive health recommendations were provided to patient.

## 2023-02-19 NOTE — Progress Notes (Signed)
Subjective:   Brittany Duffy is a 67 y.o. female who presents for Medicare Annual (Subsequent) preventive examination.  Review of Systems           Objective:    There were no vitals filed for this visit. There is no height or weight on file to calculate BMI.     11/11/2015   11:06 PM  Advanced Directives  Does Patient Have a Medical Advance Directive? No    Current Medications (verified) Outpatient Encounter Medications as of 02/19/2023  Medication Sig   ALPRAZolam (XANAX) 0.5 MG tablet Take 0.5 mg by mouth.    amLODipine (NORVASC) 10 MG tablet Take 1 tablet (10 mg total) by mouth daily.   Ascorbic Acid (VITAMIN C PO) Take 1 tablet by mouth daily.    aspirin EC 81 MG tablet Take 1 tablet (81 mg total) by mouth daily. Swallow whole.   CALCIUM PO Take 1 tablet by mouth daily.    cetirizine (ZYRTEC) 10 MG tablet Take 10 mg by mouth 2 (two) times daily.   Cholecalciferol (VITAMIN D PO) Take by mouth.   Cyanocobalamin (VITAMIN B 12 PO) Take by mouth.   fish oil-omega-3 fatty acids 1000 MG capsule Take 2 g by mouth daily.     fluticasone (FLONASE) 50 MCG/ACT nasal spray Place 2 sprays into both nostrils daily.   gabapentin (NEURONTIN) 300 MG capsule Take 1 capsule (300 mg total) by mouth 2 (two) times daily.   GARLIC PO Take by mouth.   losartan (COZAAR) 25 MG tablet Take 1 tablet (25 mg total) by mouth daily.   MILK THISTLE PO Take by mouth.   Multiple Vitamin (MULTIVITAMIN PO) Take 1 tablet by mouth daily. Reported on 04/28/2016   rosuvastatin (CRESTOR) 20 MG tablet Take 1 tablet (20 mg total) by mouth daily.   VITAMIN A PO Take by mouth.   VITAMIN E PO Take 1 tablet by mouth daily.    No facility-administered encounter medications on file as of 02/19/2023.    Allergies (verified) Patient has no known allergies.   History: Past Medical History:  Diagnosis Date   Bronchitis    Dysplasia of cervix, low grade (CIN 1)    S/P CRYO   Fibroid    Hepatitis C     Hypertension    Osteopenia 08/2018   T score -1.9 FRAX 4.7% / 1%   Vaginal intraepithelial neoplasia III (VAIN III)    Past Surgical History:  Procedure Laterality Date   CARPAL TUNNEL RELEASE     CO2 LSAER OF VAGINA  07, 11   DIVERTICULUM OF THE BLADDER     OOPHORECTOMY     BSO   TOTAL ABDOMINAL HYSTERECTOMY  2004   BSO   Family History  Problem Relation Age of Onset   Diabetes Mother    Hypertension Mother    Breast cancer Mother        Age Late 41's   Hypertension Maternal Grandfather    Social History   Socioeconomic History   Marital status: Single    Spouse name: Not on file   Number of children: Not on file   Years of education: Not on file   Highest education level: Not on file  Occupational History   Not on file  Tobacco Use   Smoking status: Every Day    Packs/day: 1    Types: Cigarettes    Passive exposure: Current   Smokeless tobacco: Current  Vaping Use   Vaping Use: Never  used  Substance and Sexual Activity   Alcohol use: Yes    Comment: occassional   Drug use: No   Sexual activity: Yes    Partners: Male    Birth control/protection: Surgical    Comment: Hyst., INTERCOURSE AGE 13, SEXUAL PARTNERS MORE THAN 5  Other Topics Concern   Not on file  Social History Narrative   Not on file   Social Determinants of Health   Financial Resource Strain: Not on file  Food Insecurity: Not on file  Transportation Needs: Not on file  Physical Activity: Not on file  Stress: Not on file  Social Connections: Not on file    Tobacco Counseling Ready to quit: Not Answered Counseling given: Not Answered   Clinical Intake:                 Diabetic?n/a         Activities of Daily Living     No data to display           Patient Care Team: [name], NP as PCP - General (Nurse Practitioner) Lyn Records, MD (Inactive) as PCP - Cardiology (Cardiology)  Indicate any recent Medical Services you may have received from other  than [facility] providers in the past year (date may be approximate).     Assessment:   This is a routine wellness examination for Brittany Duffy.  Hearing/Vision screen No results found.  Dietary issues and exercise activities discussed:     Goals Addressed   None   Depression Screen    04/21/2022    1:58 PM 12/23/2021    2:20 PM 08/27/2021    4:30 PM 05/09/2021    2:29 PM 04/08/2021    4:05 PM  PHQ 2/9 Scores  PHQ - 2 Score 0 1 2 0 0  PHQ- 9 Score   5 0 0    Fall Risk    08/27/2021    4:30 PM 04/08/2021    4:05 PM  Fall Risk   Falls in the past year? 0 0  Number falls in past yr: 0 0  Injury with Fall? 0 0  Risk for fall due to : No Fall Risks No Fall Risks  Follow up Falls evaluation completed Falls evaluation completed    FALL RISK PREVENTION PERTAINING TO THE HOME:  Any stairs in or around the home? No  If so, are there any without handrails? No  Home free of loose throw rugs in walkways, pet beds, electrical cords, etc? No  Adequate lighting in your home to reduce risk of falls? Yes   ASSISTIVE DEVICES UTILIZED TO PREVENT FALLS:  Life alert? No  Use of a cane, walker or w/c? No  Grab bars in the bathroom? No  Shower chair or bench in shower? No  Elevated toilet seat or a handicapped toilet? No   TIMED UP AND GO:  Was the test performed? .  Length of time to ambulate 10 feet:  sec.     Cognitive Function:        Immunizations Immunization History  Administered Date(s) Administered   Influenza,inj,Quad PF,6+ Mos 09/14/2016, 09/29/2018, 08/27/2020, 08/27/2021   Tdap 11/12/2015    TDAP status: Up to date  Flu Vaccine status: Up to date  Pneumococcal vaccine status: Declined,  Education has been provided regarding the importance of this vaccine but patient still declined. Advised may receive this vaccine at local pharmacy or Health Dept. Aware to provide a copy of the vaccination record if obtained  from local pharmacy or Health Dept. Verbalized acceptance  and understanding.   Covid-19 vaccine status: Information provided on how to obtain vaccines.   Qualifies for Shingles Vaccine? Yes   Zostavax completed Yes   Shingrix Completed?: Yes  Screening Tests Health Maintenance  Topic Date Due   COVID-19 Vaccine (1) Never done   COLONOSCOPY (Pts 45-19yrs Insurance coverage will need to be confirmed)  Never done   Zoster Vaccines- Shingrix (1 of 2) Never done   PAP SMEAR-Modifier  08/27/2021   Lung Cancer Screening  01/13/2023   Pneumonia Vaccine 12+ Years old (1 of 2 - PCV) 08/27/2023 (Originally 07/25/1962)   INFLUENZA VACCINE  06/10/2023   MAMMOGRAM  01/01/2024   Medicare Annual Wellness (AWV)  02/19/2024   DTaP/Tdap/Td (2 - Td or Tdap) 11/11/2025   DEXA SCAN  Completed   Hepatitis C Screening  Completed   HPV VACCINES  Aged Out    Health Maintenance  Health Maintenance Due  Topic Date Due   COVID-19 Vaccine (1) Never done   COLONOSCOPY (Pts 45-47yrs Insurance coverage will need to be confirmed)  Never done   Zoster Vaccines- Shingrix (1 of 2) Never done   PAP SMEAR-Modifier  08/27/2021   Lung Cancer Screening  01/13/2023    Colorectal cancer screening: No longer required.   Mammogram status: Ordered 03/11/2023. Pt provided with contact info and advised to call to schedule appt.   Bone Density status: Completed 10/20/2022. Results reflect: Bone density results: NORMAL. Repeat every   years.  Lung Cancer Screening: (Low Dose CT Chest recommended if Age 56-80 years, 30 pack-year currently smoking OR have quit w/in 15years.) does qualify.   Lung Cancer Screening Referral:   Additional Screening:  Hepatitis C Screening: does not qualify; Completed 02/10/2022  Vision Screening: Recommended annual ophthalmology exams for early detection of glaucoma and other disorders of the eye. Is the patient up to date with their annual eye exam?  Yes  Who is the provider or what is the name of the office in which the patient attends annual  eye exams?  Dr Fredna Dow  If pt is not established with a provider, would they like to be referred to a provider to establish care?   Dental Screening: Recommended annual dental exams for proper oral hygiene  Community Resource Referral / Chronic Care Management: CRR required this visit?  No   CCM required this visit?  No      Plan:     I have personally reviewed and noted the following in the patient's chart:   Medical and social history Use of alcohol, tobacco or illicit drugs  Current medications and supplements including opioid prescriptions. Patient is not currently taking opioid prescriptions. Functional ability and status Nutritional status Physical activity Advanced directives List of other physicians Hospitalizations, surgeries, and ER visits in previous 12 months Vitals Screenings to include cognitive, depression, and falls Referrals and appointments  In addition, I have reviewed and discussed with patient certain preventive protocols, quality metrics, and best practice recommendations. A written personalized care plan for preventive services as well as general preventive health recommendations were provided to patient.         Nurse Notes:

## 2023-02-20 LAB — CBC
Hematocrit: 36.1 % (ref 34.0–46.6)
Hemoglobin: 11.9 g/dL (ref 11.1–15.9)
MCH: 30.7 pg (ref 26.6–33.0)
MCHC: 33 g/dL (ref 31.5–35.7)
MCV: 93 fL (ref 79–97)
Platelets: 176 10*3/uL (ref 150–450)
RBC: 3.88 x10E6/uL (ref 3.77–5.28)
RDW: 13.5 % (ref 11.7–15.4)
WBC: 5.1 10*3/uL (ref 3.4–10.8)

## 2023-02-20 LAB — BASIC METABOLIC PANEL
BUN/Creatinine Ratio: 16 (ref 12–28)
BUN: 12 mg/dL (ref 8–27)
CO2: 21 mmol/L (ref 20–29)
Calcium: 9.5 mg/dL (ref 8.7–10.3)
Chloride: 106 mmol/L (ref 96–106)
Creatinine, Ser: 0.77 mg/dL (ref 0.57–1.00)
Glucose: 110 mg/dL — ABNORMAL HIGH (ref 70–99)
Potassium: 3.9 mmol/L (ref 3.5–5.2)
Sodium: 144 mmol/L (ref 134–144)
eGFR: 84 mL/min/{1.73_m2} (ref >59)

## 2023-02-20 LAB — TSH: TSH: 0.842 u[IU]/mL (ref 0.450–4.500)

## 2023-02-20 LAB — HEMOGLOBIN A1C
Est. average glucose Bld gHb Est-mCnc: 131 mg/dL
Hgb A1c MFr Bld: 6.2 % — ABNORMAL HIGH (ref 4.8–5.6)

## 2023-03-05 ENCOUNTER — Encounter: Payer: Self-pay | Admitting: Family

## 2023-03-08 ENCOUNTER — Ambulatory Visit
Admission: RE | Admit: 2023-03-08 | Discharge: 2023-03-08 | Disposition: A | Discharge status: Home / Self Care | Payer: Medicare Other | Source: Ambulatory Visit | Attending: Family | Admitting: Family

## 2023-03-08 DIAGNOSIS — Z122 Encounter for screening for malignant neoplasm of respiratory organs: Secondary | ICD-10-CM

## 2023-03-09 ENCOUNTER — Encounter (HOSPITAL_COMMUNITY)
Admission: EM | Disposition: A | Discharge status: Home / Self Care | Payer: Self-pay | Source: Home / Self Care | Attending: Cardiovascular Disease

## 2023-03-09 ENCOUNTER — Inpatient Hospital Stay (HOSPITAL_COMMUNITY)
Admission: EM | Admit: 2023-03-09 | Discharge: 2023-03-11 | DRG: 322 | Disposition: A | Discharge status: Home / Self Care | Payer: Medicare Other | Attending: Internal Medicine | Admitting: Internal Medicine

## 2023-03-09 ENCOUNTER — Ambulatory Visit (HOSPITAL_COMMUNITY): Admit: 2023-03-09 | Payer: Medicare Other | Admitting: Cardiovascular Disease

## 2023-03-09 DIAGNOSIS — Z79899 Other long term (current) drug therapy: Secondary | ICD-10-CM

## 2023-03-09 DIAGNOSIS — Z7982 Long term (current) use of aspirin: Secondary | ICD-10-CM | POA: Diagnosis not present

## 2023-03-09 DIAGNOSIS — F17210 Nicotine dependence, cigarettes, uncomplicated: Secondary | ICD-10-CM | POA: Diagnosis present

## 2023-03-09 DIAGNOSIS — Z8249 Family history of ischemic heart disease and other diseases of the circulatory system: Secondary | ICD-10-CM

## 2023-03-09 DIAGNOSIS — Z716 Tobacco abuse counseling: Secondary | ICD-10-CM | POA: Diagnosis not present

## 2023-03-09 DIAGNOSIS — I25110 Atherosclerotic heart disease of native coronary artery with unstable angina pectoris: Secondary | ICD-10-CM | POA: Diagnosis not present

## 2023-03-09 DIAGNOSIS — I10 Essential (primary) hypertension: Secondary | ICD-10-CM | POA: Diagnosis present

## 2023-03-09 DIAGNOSIS — M8580 Other specified disorders of bone density and structure, unspecified site: Secondary | ICD-10-CM | POA: Diagnosis present

## 2023-03-09 DIAGNOSIS — Z833 Family history of diabetes mellitus: Secondary | ICD-10-CM

## 2023-03-09 DIAGNOSIS — Z803 Family history of malignant neoplasm of breast: Secondary | ICD-10-CM | POA: Diagnosis not present

## 2023-03-09 DIAGNOSIS — E785 Hyperlipidemia, unspecified: Secondary | ICD-10-CM | POA: Diagnosis present

## 2023-03-09 DIAGNOSIS — I272 Pulmonary hypertension, unspecified: Secondary | ICD-10-CM | POA: Diagnosis present

## 2023-03-09 DIAGNOSIS — I2119 ST elevation (STEMI) myocardial infarction involving other coronary artery of inferior wall: Secondary | ICD-10-CM | POA: Diagnosis not present

## 2023-03-09 DIAGNOSIS — I959 Hypotension, unspecified: Secondary | ICD-10-CM | POA: Diagnosis not present

## 2023-03-09 DIAGNOSIS — Z87410 Personal history of cervical dysplasia: Secondary | ICD-10-CM | POA: Diagnosis not present

## 2023-03-09 DIAGNOSIS — I252 Old myocardial infarction: Secondary | ICD-10-CM | POA: Diagnosis present

## 2023-03-09 DIAGNOSIS — Z90710 Acquired absence of both cervix and uterus: Secondary | ICD-10-CM

## 2023-03-09 DIAGNOSIS — I2111 ST elevation (STEMI) myocardial infarction involving right coronary artery: Secondary | ICD-10-CM | POA: Diagnosis present

## 2023-03-09 DIAGNOSIS — I2510 Atherosclerotic heart disease of native coronary artery without angina pectoris: Secondary | ICD-10-CM | POA: Diagnosis present

## 2023-03-09 DIAGNOSIS — Z955 Presence of coronary angioplasty implant and graft: Secondary | ICD-10-CM

## 2023-03-09 HISTORY — PX: LEFT HEART CATH AND CORONARY ANGIOGRAPHY: CATH118249

## 2023-03-09 HISTORY — PX: CORONARY/GRAFT ACUTE MI REVASCULARIZATION: CATH118305

## 2023-03-09 LAB — COMPREHENSIVE METABOLIC PANEL
ALT: 13 U/L (ref 0–44)
AST: 21 U/L (ref 15–41)
Albumin: 3.9 g/dL (ref 3.5–5.0)
Alkaline Phosphatase: 55 U/L (ref 38–126)
Anion gap: 12 (ref 5–15)
BUN: 8 mg/dL (ref 8–23)
CO2: 23 mmol/L (ref 22–32)
Calcium: 9.2 mg/dL (ref 8.9–10.3)
Chloride: 103 mmol/L (ref 98–111)
Creatinine, Ser: 0.96 mg/dL (ref 0.44–1.00)
GFR, Estimated: >60 mL/min (ref >60)
Glucose, Bld: 145 mg/dL — ABNORMAL HIGH (ref 70–99)
Potassium: 3.2 mmol/L — ABNORMAL LOW (ref 3.5–5.1)
Sodium: 138 mmol/L (ref 135–145)
Total Bilirubin: 0.4 mg/dL (ref 0.3–1.2)
Total Protein: 6.9 g/dL (ref 6.5–8.1)

## 2023-03-09 LAB — CBC WITH DIFFERENTIAL/PLATELET
Abs Immature Granulocytes: 0.01 10*3/uL (ref 0.00–0.07)
Basophils Absolute: 0 10*3/uL (ref 0.0–0.1)
Basophils Relative: 0 %
Eosinophils Absolute: 0.1 10*3/uL (ref 0.0–0.5)
Eosinophils Relative: 1 %
HCT: 34.1 % — ABNORMAL LOW (ref 36.0–46.0)
Hemoglobin: 11.5 g/dL — ABNORMAL LOW (ref 12.0–15.0)
Immature Granulocytes: 0 %
Lymphocytes Relative: 22 %
Lymphs Abs: 1.7 10*3/uL (ref 0.7–4.0)
MCH: 30.4 pg (ref 26.0–34.0)
MCHC: 33.7 g/dL (ref 30.0–36.0)
MCV: 90.2 fL (ref 80.0–100.0)
Monocytes Absolute: 0.4 10*3/uL (ref 0.1–1.0)
Monocytes Relative: 6 %
Neutro Abs: 5.7 10*3/uL (ref 1.7–7.7)
Neutrophils Relative %: 71 %
Platelets: 195 10*3/uL (ref 150–400)
RBC: 3.78 MIL/uL — ABNORMAL LOW (ref 3.87–5.11)
RDW: 13.2 % (ref 11.5–15.5)
WBC: 8 10*3/uL (ref 4.0–10.5)
nRBC: 0 % (ref 0.0–0.2)

## 2023-03-09 LAB — PROTIME-INR
INR: 1.1 (ref 0.8–1.2)
Prothrombin Time: 13.8 seconds (ref 11.4–15.2)

## 2023-03-09 LAB — POCT I-STAT, CHEM 8
BUN: 8 mg/dL (ref 8–23)
Calcium, Ion: 1.27 mmol/L (ref 1.15–1.40)
Chloride: 103 mmol/L (ref 98–111)
Creatinine, Ser: 0.8 mg/dL (ref 0.44–1.00)
Glucose, Bld: 146 mg/dL — ABNORMAL HIGH (ref 70–99)
HCT: 35 % — ABNORMAL LOW (ref 36.0–46.0)
Hemoglobin: 11.9 g/dL — ABNORMAL LOW (ref 12.0–15.0)
Potassium: 3.3 mmol/L — ABNORMAL LOW (ref 3.5–5.1)
Sodium: 141 mmol/L (ref 135–145)
TCO2: 24 mmol/L (ref 22–32)

## 2023-03-09 LAB — LIPID PANEL
Cholesterol: 128 mg/dL (ref 0–200)
HDL: 52 mg/dL (ref >40)
LDL Cholesterol: 55 mg/dL (ref 0–99)
Total CHOL/HDL Ratio: 2.5 RATIO
Triglycerides: 107 mg/dL (ref <150)
VLDL: 21 mg/dL (ref 0–40)

## 2023-03-09 LAB — TROPONIN I (HIGH SENSITIVITY)
Troponin I (High Sensitivity): 120 ng/L (ref <18)
Troponin I (High Sensitivity): 5792 ng/L (ref <18)

## 2023-03-09 LAB — POCT ACTIVATED CLOTTING TIME
Activated Clotting Time: 353 seconds
Activated Clotting Time: 363 seconds

## 2023-03-09 LAB — APTT: aPTT: 29 seconds (ref 24–36)

## 2023-03-09 LAB — HEMOGLOBIN A1C
Hgb A1c MFr Bld: 6.1 % — ABNORMAL HIGH (ref 4.8–5.6)
Mean Plasma Glucose: 128.37 mg/dL

## 2023-03-09 SURGERY — LEFT HEART CATH AND CORONARY ANGIOGRAPHY
Anesthesia: LOCAL

## 2023-03-09 MED ORDER — MIDAZOLAM HCL 2 MG/2ML IJ SOLN
INTRAMUSCULAR | Status: DC | PRN
  Administered 2023-03-09 (×2): 1 mg via INTRAVENOUS

## 2023-03-09 MED ORDER — HEPARIN SODIUM (PORCINE) 1000 UNIT/ML IJ SOLN
INTRAMUSCULAR | Status: DC | PRN
  Administered 2023-03-09: 10000 [IU] via INTRAVENOUS

## 2023-03-09 MED ORDER — SODIUM CHLORIDE 0.9% FLUSH: 3.0000 mL | INTRAVENOUS | Status: DC | PRN

## 2023-03-09 MED ORDER — VERAPAMIL HCL 2.5 MG/ML IV SOLN
INTRAVENOUS | Status: AC
  Filled 2023-03-09: qty 2

## 2023-03-09 MED ORDER — TRAZODONE HCL 50 MG PO TABS
50.0000 mg | ORAL_TABLET | Freq: Every evening | ORAL | Status: DC | PRN
  Administered 2023-03-10 (×2): 50 mg via ORAL
  Filled 2023-03-09 (×2): qty 1

## 2023-03-09 MED ORDER — FENTANYL CITRATE (PF) 100 MCG/2ML IJ SOLN
INTRAMUSCULAR | Status: DC | PRN
  Administered 2023-03-09 (×4): 25 ug via INTRAVENOUS

## 2023-03-09 MED ORDER — HYDRALAZINE HCL 20 MG/ML IJ SOLN: 10.0000 mg | INTRAMUSCULAR | Status: AC | PRN

## 2023-03-09 MED ORDER — LIDOCAINE HCL (PF) 1 % IJ SOLN
INTRAMUSCULAR | Status: AC
  Filled 2023-03-09: qty 30

## 2023-03-09 MED ORDER — TICAGRELOR 90 MG PO TABS
90.0000 mg | ORAL_TABLET | Freq: Two times a day (BID) | ORAL | Status: DC
  Administered 2023-03-09 – 2023-03-11 (×4): 90 mg via ORAL
  Filled 2023-03-09 (×4): qty 1

## 2023-03-09 MED ORDER — IOHEXOL 350 MG/ML SOLN
INTRAVENOUS | Status: DC | PRN
  Administered 2023-03-09: 90 mL

## 2023-03-09 MED ORDER — SODIUM CHLORIDE 0.9 % IV SOLN: 250.0000 mL | INTRAVENOUS | Status: DC | PRN

## 2023-03-09 MED ORDER — ACETAMINOPHEN 325 MG PO TABS: 650.0000 mg | ORAL_TABLET | ORAL | Status: DC | PRN

## 2023-03-09 MED ORDER — HEPARIN SODIUM (PORCINE) 1000 UNIT/ML IJ SOLN
INTRAMUSCULAR | Status: AC
  Filled 2023-03-09: qty 10

## 2023-03-09 MED ORDER — CHLORHEXIDINE GLUCONATE CLOTH 2 % EX PADS
6.0000 | MEDICATED_PAD | Freq: Every day | CUTANEOUS | Status: DC
  Administered 2023-03-10 – 2023-03-11 (×2): 6 via TOPICAL

## 2023-03-09 MED ORDER — ROSUVASTATIN CALCIUM 20 MG PO TABS
40.0000 mg | ORAL_TABLET | Freq: Every day | ORAL | Status: DC
  Administered 2023-03-10 – 2023-03-11 (×2): 40 mg via ORAL
  Filled 2023-03-09: qty 2
  Filled 2023-03-09: qty 1
  Filled 2023-03-09: qty 2

## 2023-03-09 MED ORDER — NOREPINEPHRINE BITARTRATE 1 MG/ML IV SOLN
INTRAVENOUS | Status: DC | PRN
  Administered 2023-03-09: 5 ug/min via INTRAVENOUS

## 2023-03-09 MED ORDER — TICAGRELOR 90 MG PO TABS
ORAL_TABLET | ORAL | Status: AC
  Filled 2023-03-09: qty 2

## 2023-03-09 MED ORDER — TIROFIBAN HCL IN NACL 5-0.9 MG/100ML-% IV SOLN
INTRAVENOUS | Status: AC
  Filled 2023-03-09: qty 100

## 2023-03-09 MED ORDER — SODIUM CHLORIDE 0.9% FLUSH
3.0000 mL | Freq: Two times a day (BID) | INTRAVENOUS | Status: DC
  Administered 2023-03-09 – 2023-03-11 (×4): 3 mL via INTRAVENOUS

## 2023-03-09 MED ORDER — FUROSEMIDE 10 MG/ML IJ SOLN
INTRAMUSCULAR | Status: AC
  Filled 2023-03-09: qty 4

## 2023-03-09 MED ORDER — LOSARTAN POTASSIUM 25 MG PO TABS
25.0000 mg | ORAL_TABLET | Freq: Every day | ORAL | Status: DC
  Administered 2023-03-10 – 2023-03-11 (×2): 25 mg via ORAL
  Filled 2023-03-09 (×2): qty 1

## 2023-03-09 MED ORDER — TIROFIBAN (AGGRASTAT) BOLUS VIA INFUSION
INTRAVENOUS | Status: DC | PRN
  Administered 2023-03-09: 1652.5 ug via INTRAVENOUS

## 2023-03-09 MED ORDER — NOREPINEPHRINE 4 MG/250ML-% IV SOLN
INTRAVENOUS | Status: AC
  Filled 2023-03-09: qty 250

## 2023-03-09 MED ORDER — FUROSEMIDE 10 MG/ML IJ SOLN
40.0000 mg | Freq: Every day | INTRAMUSCULAR | Status: DC
  Administered 2023-03-09 – 2023-03-11 (×3): 40 mg via INTRAVENOUS
  Filled 2023-03-09 (×2): qty 4

## 2023-03-09 MED ORDER — ORAL CARE MOUTH RINSE: 15.0000 mL | OROMUCOSAL | Status: DC | PRN

## 2023-03-09 MED ORDER — ATROPINE SULFATE 1 MG/10ML IJ SOSY
PREFILLED_SYRINGE | INTRAMUSCULAR | Status: AC
  Filled 2023-03-09: qty 10

## 2023-03-09 MED ORDER — FENTANYL CITRATE (PF) 100 MCG/2ML IJ SOLN
INTRAMUSCULAR | Status: AC
  Filled 2023-03-09: qty 2

## 2023-03-09 MED ORDER — LABETALOL HCL 5 MG/ML IV SOLN: 10.0000 mg | INTRAVENOUS | Status: AC | PRN

## 2023-03-09 MED ORDER — VERAPAMIL HCL 2.5 MG/ML IV SOLN
INTRAVENOUS | Status: DC | PRN
  Administered 2023-03-09: 10 mL via INTRA_ARTERIAL

## 2023-03-09 MED ORDER — ASPIRIN 81 MG PO TBEC
81.0000 mg | DELAYED_RELEASE_TABLET | Freq: Every day | ORAL | Status: DC
  Administered 2023-03-10 – 2023-03-11 (×2): 81 mg via ORAL
  Filled 2023-03-09 (×2): qty 1

## 2023-03-09 MED ORDER — FAMOTIDINE IN NACL 20-0.9 MG/50ML-% IV SOLN
INTRAVENOUS | Status: AC
  Filled 2023-03-09: qty 50

## 2023-03-09 MED ORDER — ATROPINE SULFATE 1 MG/10ML IJ SOSY
PREFILLED_SYRINGE | INTRAMUSCULAR | Status: DC | PRN
  Administered 2023-03-09: .5 mg via INTRAVENOUS

## 2023-03-09 MED ORDER — MELATONIN 5 MG PO TABS
5.0000 mg | ORAL_TABLET | Freq: Every day | ORAL | Status: DC
  Administered 2023-03-10 (×2): 5 mg via ORAL
  Filled 2023-03-09 (×2): qty 1

## 2023-03-09 MED ORDER — TIROFIBAN HCL IN NACL 5-0.9 MG/100ML-% IV SOLN
INTRAVENOUS | Status: AC | PRN
  Administered 2023-03-09: .15 ug/kg/min via INTRAVENOUS

## 2023-03-09 MED ORDER — TICAGRELOR 90 MG PO TABS
ORAL_TABLET | ORAL | Status: DC | PRN
  Administered 2023-03-09: 180 mg via ORAL

## 2023-03-09 MED ORDER — OXYCODONE HCL 5 MG PO TABS: 5.0000 mg | ORAL_TABLET | ORAL | Status: DC | PRN

## 2023-03-09 MED ORDER — TIROFIBAN HCL IN NACL 5-0.9 MG/100ML-% IV SOLN
0.1500 ug/kg/min | INTRAVENOUS | Status: AC
  Administered 2023-03-09: 0.15 ug/kg/min via INTRAVENOUS
  Filled 2023-03-09: qty 100

## 2023-03-09 MED ORDER — ONDANSETRON HCL 4 MG/2ML IJ SOLN: 4.0000 mg | Freq: Four times a day (QID) | INTRAMUSCULAR | Status: DC | PRN

## 2023-03-09 MED ORDER — SODIUM CHLORIDE 0.9 % IV BOLUS
INTRAVENOUS | Status: AC | PRN
  Administered 2023-03-09: 250 mL via INTRAVENOUS

## 2023-03-09 MED ORDER — FAMOTIDINE IN NACL 20-0.9 MG/50ML-% IV SOLN
INTRAVENOUS | Status: AC | PRN
  Administered 2023-03-09: 20 mg via INTRAVENOUS

## 2023-03-09 MED ORDER — HEPARIN (PORCINE) IN NACL 1000-0.9 UT/500ML-% IV SOLN
INTRAVENOUS | Status: DC | PRN
  Administered 2023-03-09 (×2): 500 mL

## 2023-03-09 MED ORDER — MIDAZOLAM HCL 2 MG/2ML IJ SOLN
INTRAMUSCULAR | Status: AC
  Filled 2023-03-09: qty 2

## 2023-03-09 SURGICAL SUPPLY — 23 items
BALLN EMERGE MR 2.0X12 (BALLOONS) ×1
BALLN EMERGE MR 2.5X20 (BALLOONS) ×1
BALLN ~~LOC~~ EMERGE MR 3.25X20 (BALLOONS) ×1
BALLOON EMERGE MR 2.0X12 (BALLOONS) IMPLANT
BALLOON EMERGE MR 2.5X20 (BALLOONS) IMPLANT
BALLOON ~~LOC~~ EMERGE MR 3.25X20 (BALLOONS) IMPLANT
CATH INFINITI 5 FR JL3.5 (CATHETERS) IMPLANT
CATH TELESCOPE 6F GEC (CATHETERS) IMPLANT
CATH VISTA GUIDE 6FR JR4 (CATHETERS) IMPLANT
DEVICE RAD COMP TR BAND LRG (VASCULAR PRODUCTS) IMPLANT
ELECT DEFIB PAD ADLT CADENCE (PAD) IMPLANT
GLIDESHEATH SLEND SS 6F .021 (SHEATH) IMPLANT
GUIDEWIRE INQWIRE 1.5J.035X260 (WIRE) IMPLANT
INQWIRE 1.5J .035X260CM (WIRE) ×1
KIT ENCORE 26 ADVANTAGE (KITS) IMPLANT
KIT HEART LEFT (KITS) ×1 IMPLANT
PACK CARDIAC CATHETERIZATION (CUSTOM PROCEDURE TRAY) ×1 IMPLANT
STENT SYNERGY XD 3.0X32 (Permanent Stent) IMPLANT
STENT SYNERGY XD 3.0X38 (Permanent Stent) IMPLANT
SYNERGY XD 3.0X38 (Permanent Stent) ×1 IMPLANT
TRANSDUCER W/STOPCOCK (MISCELLANEOUS) ×1 IMPLANT
TUBING CIL FLEX 10 FLL-RA (TUBING) ×1 IMPLANT
WIRE COUGAR XT STRL 190CM (WIRE) IMPLANT

## 2023-03-09 NOTE — H&P (Signed)
Cardiology Admission History and Physical   Patient ID: Brittany Duffy MRN: [id number]; DOB: 10/01/1956   Admission date: 03/09/2023  PCP:  [name], NP   [facility] Providers Cardiologist:  Dr. Garnette Scheuermann (former)  Chief Complaint:  Chest pain  History of Present Illness:   Brittany Duffy is a 67 yo female with history of CAD by chest CT, HTN and tobacco abuse who is presenting to [facility] via EMS with c/o chest pain. EKG with 2 mm inferior ST elevation and ST depression in the lateral leads. Code STEMI called by EMS. Pt with 7/10 chest pain on arrival to [facility]. Pt taken for emergent cardiac cath.   Past Medical History:  Diagnosis Date   Bronchitis    Dysplasia of cervix, low grade (CIN 1)    S/P CRYO   Fibroid    Hepatitis C    Hypertension    Osteopenia 08/2018   T score -1.9 FRAX 4.7% / 1%   Vaginal intraepithelial neoplasia III (VAIN III)     Past Surgical History:  Procedure Laterality Date   CARPAL TUNNEL RELEASE     CO2 LSAER OF VAGINA  07, 11   DIVERTICULUM OF THE BLADDER     OOPHORECTOMY     BSO   TOTAL ABDOMINAL HYSTERECTOMY  2004   BSO     Medications Prior to Admission: Prior to Admission medications   Medication Sig Start Date End Date Taking? Authorizing Provider  ALPRAZolam Prudy Feeler) 0.5 MG tablet Take 0.5 mg by mouth.  09/05/16   [provider]  Ascorbic Acid (VITAMIN C PO) Take 1 tablet by mouth daily.     [provider]  aspirin EC 81 MG tablet Take 1 tablet (81 mg total) by mouth daily. Swallow whole. 08/12/22   Lyn Records, MD  CALCIUM PO Take 1 tablet by mouth daily.     [provider]  cetirizine (ZYRTEC) 10 MG tablet Take 10 mg by mouth 2 (two) times daily.    [provider]  Cholecalciferol (VITAMIN D PO) Take by mouth.    [provider]  Cyanocobalamin (VITAMIN B 12 PO) Take by mouth.    [provider]  fish oil-omega-3 fatty acids 1000 MG capsule  Take 2 g by mouth daily.      [provider]  GARLIC PO Take by mouth.    [provider]  losartan (COZAAR) 25 MG tablet Take 1 tablet (25 mg total) by mouth daily. 02/19/23 05/20/23  [name], NP  MILK THISTLE PO Take by mouth.    [provider]  Multiple Vitamin (MULTIVITAMIN PO) Take 1 tablet by mouth daily. Reported on 04/28/2016    [provider]  rosuvastatin (CRESTOR) 20 MG tablet Take 1 tablet (20 mg total) by mouth daily. 05/21/22   Lyn Records, MD  VITAMIN A PO Take by mouth.    [provider]  VITAMIN E PO Take 1 tablet by mouth daily.     [provider]     Allergies:   No Known Allergies  Social History:   Social History   Socioeconomic History   Marital status: Single    Spouse name: Not on file   Number of children: Not on file   Years of education: Not on file   Highest education level: Not on file  Occupational History   Not on file  Tobacco Use   Smoking status: Every Day  Packs/day: 1    Types: Cigarettes    Passive exposure: Current   Smokeless tobacco: Current  Vaping Use   Vaping Use: Never used  Substance and Sexual Activity   Alcohol use: Yes    Comment: occassional   Drug use: No   Sexual activity: Yes    Partners: Male    Birth control/protection: Surgical    Comment: Hyst., INTERCOURSE AGE 78, SEXUAL PARTNERS MORE THAN 5  Other Topics Concern   Not on file  Social History Narrative   Not on file   Social Determinants of Health   Financial Resource Strain: Not on file  Food Insecurity: Not on file  Transportation Needs: Not on file  Physical Activity: Not on file  Stress: Not on file  Social Connections: Not on file  Intimate Partner Violence: Not on file    Family History:   The patient's family history includes Breast cancer in her mother; Diabetes in her mother; Hypertension in her maternal grandfather and mother.    ROS:  Please see the history of present  illness.  All other ROS reviewed and negative.     Physical Exam/Data:  There were no vitals filed for this visit. No intake or output data in the 24 hours ending 03/09/23 1648    02/19/2023    3:49 PM 09/15/2022    2:41 PM 08/19/2022    1:50 PM  Last 3 Weights  Weight (lbs) 145 lb 12.8 oz 140 lb 140 lb  Weight (kg) 66.134 kg 63.504 kg 63.504 kg     There is no height or weight on file to calculate BMI.  General:  Well nourished, well developed, appears uncomfortable HEENT: normal Neck: no JVD Vascular: No carotid bruits; Distal pulses 2+ bilaterally   Cardiac:  normal S1, S2; RRR; no murmur  Lungs:  clear to auscultation bilaterally, no wheezing, rhonchi or rales  Abd: soft, nontender, no hepatomegaly  Ext: no LE edema Musculoskeletal:  No deformities, BUE and BLE strength normal and equal Skin: warm and dry  Neuro:  CNs 2-12 intact, no focal abnormalities noted Psych:  Normal affect    EKG:  The ECG that was done was personally reviewed and demonstrates sinus with 2mm ST elevation inferior leads c/w acute MI  Relevant CV Studies:   Laboratory Data:  High Sensitivity Troponin:  No results for input(s): "TROPONINIHS" in the last 720 hours.    ChemistryNo results for input(s): "NA", "K", "CL", "CO2", "GLUCOSE", "BUN", "CREATININE", "CALCIUM", "MG", "GFRNONAA", "GFRAA", "ANIONGAP" in the last 168 hours.  No results for input(s): "PROT", "ALBUMIN", "AST", "ALT", "ALKPHOS", "BILITOT" in the last 168 hours. Lipids No results for input(s): "CHOL", "TRIG", "HDL", "LABVLDL", "LDLCALC", "CHOLHDL" in the last 168 hours. HematologyNo results for input(s): "WBC", "RBC", "HGB", "HCT", "MCV", "MCH", "MCHC", "RDW", "PLT" in the last 168 hours. Thyroid No results for input(s): "TSH", "FREET4" in the last 168 hours. BNPNo results for input(s): "BNP", "PROBNP" in the last 168 hours.  DDimer No results for input(s): "DDIMER" in the last 168 hours.   Radiology/Studies:  No results  found.   Assessment and Plan:   Acute inferior STEMI: Will plan emergent cardiac cath . Further plans to follow after the cath procedure.   For questions or updates, please contact [facility] Please consult [email or web address] for contact info under     Signed, Verne Carrow, MD  03/09/2023 4:48 PM

## 2023-03-09 NOTE — Progress Notes (Signed)
eLink Physician-Brief Progress Note Patient Name: Brittany Duffy DOB: 11-Nov-1955 MRN: [id number]   Date of Service  03/09/2023  HPI/Events of Note  Patient admitted with Code STEMI and taken to the cath lab for intervention.  eICU Interventions  New Patient Evaluation.        Migdalia Dk 03/09/2023, 9:57 PM

## 2023-03-09 NOTE — Progress Notes (Signed)
   03/09/23 1600  Spiritual Encounters  Type of Visit Initial  Care provided to: Patient  Referral source Code page  Reason for visit Code  OnCall Visit No   Ch responded to code STEMI. There was no family present. Pt is in route to Cath Lab. No follow-up needed at this time.

## 2023-03-10 ENCOUNTER — Inpatient Hospital Stay (HOSPITAL_COMMUNITY): Payer: Medicare Other

## 2023-03-10 ENCOUNTER — Encounter (HOSPITAL_COMMUNITY): Payer: Self-pay | Admitting: Cardiovascular Disease

## 2023-03-10 ENCOUNTER — Other Ambulatory Visit (HOSPITAL_COMMUNITY): Payer: Self-pay

## 2023-03-10 ENCOUNTER — Telehealth (HOSPITAL_COMMUNITY): Payer: Self-pay | Admitting: Pharmacy Technician

## 2023-03-10 DIAGNOSIS — I25110 Atherosclerotic heart disease of native coronary artery with unstable angina pectoris: Secondary | ICD-10-CM

## 2023-03-10 DIAGNOSIS — I2119 ST elevation (STEMI) myocardial infarction involving other coronary artery of inferior wall: Secondary | ICD-10-CM

## 2023-03-10 LAB — TROPONIN I (HIGH SENSITIVITY): Troponin I (High Sensitivity): >24000 ng/L (ref <18)

## 2023-03-10 LAB — HEPATIC FUNCTION PANEL
ALT: 22 U/L (ref 0–44)
AST: 100 U/L — ABNORMAL HIGH (ref 15–41)
Albumin: 3.5 g/dL (ref 3.5–5.0)
Alkaline Phosphatase: 50 U/L (ref 38–126)
Bilirubin, Direct: 0.1 mg/dL (ref 0.0–0.2)
Indirect Bilirubin: 0.3 mg/dL (ref 0.3–0.9)
Total Bilirubin: 0.4 mg/dL (ref 0.3–1.2)
Total Protein: 6.3 g/dL — ABNORMAL LOW (ref 6.5–8.1)

## 2023-03-10 LAB — ECHOCARDIOGRAM COMPLETE
AR max vel: 2.3 cm2
AV Area VTI: 2.42 cm2
AV Area mean vel: 2.55 cm2
AV Mean grad: 5 mmHg
AV Peak grad: 12.8 mmHg
Ao pk vel: 1.79 m/s
Area-P 1/2: 3.77 cm2
S' Lateral: 2.6 cm
Weight: 2331.58 oz

## 2023-03-10 LAB — BASIC METABOLIC PANEL
Anion gap: 14 (ref 5–15)
Anion gap: 11 (ref 5–15)
BUN: 11 mg/dL (ref 8–23)
BUN: 10 mg/dL (ref 8–23)
CO2: 22 mmol/L (ref 22–32)
CO2: 26 mmol/L (ref 22–32)
Calcium: 9.2 mg/dL (ref 8.9–10.3)
Calcium: 9.6 mg/dL (ref 8.9–10.3)
Chloride: 101 mmol/L (ref 98–111)
Chloride: 102 mmol/L (ref 98–111)
Creatinine, Ser: 0.8 mg/dL (ref 0.44–1.00)
Creatinine, Ser: 0.86 mg/dL (ref 0.44–1.00)
GFR, Estimated: >60 mL/min (ref >60)
GFR, Estimated: >60 mL/min (ref >60)
Glucose, Bld: 104 mg/dL — ABNORMAL HIGH (ref 70–99)
Glucose, Bld: 133 mg/dL — ABNORMAL HIGH (ref 70–99)
Potassium: 3.2 mmol/L — ABNORMAL LOW (ref 3.5–5.1)
Potassium: 3.1 mmol/L — ABNORMAL LOW (ref 3.5–5.1)
Sodium: 137 mmol/L (ref 135–145)
Sodium: 139 mmol/L (ref 135–145)

## 2023-03-10 LAB — CBC
HCT: 31.4 % — ABNORMAL LOW (ref 36.0–46.0)
Hemoglobin: 10.6 g/dL — ABNORMAL LOW (ref 12.0–15.0)
MCH: 30.2 pg (ref 26.0–34.0)
MCHC: 33.8 g/dL (ref 30.0–36.0)
MCV: 89.5 fL (ref 80.0–100.0)
Platelets: 163 10*3/uL (ref 150–400)
RBC: 3.51 MIL/uL — ABNORMAL LOW (ref 3.87–5.11)
RDW: 13.5 % (ref 11.5–15.5)
WBC: 7.7 10*3/uL (ref 4.0–10.5)
nRBC: 0 % (ref 0.0–0.2)

## 2023-03-10 LAB — MAGNESIUM: Magnesium: 2.1 mg/dL (ref 1.7–2.4)

## 2023-03-10 LAB — MRSA NEXT GEN BY PCR, NASAL: MRSA by PCR Next Gen: NOT DETECTED

## 2023-03-10 MED ORDER — POTASSIUM CHLORIDE CRYS ER 20 MEQ PO TBCR
40.0000 meq | EXTENDED_RELEASE_TABLET | Freq: Once | ORAL | Status: AC
  Administered 2023-03-10: 40 meq via ORAL
  Filled 2023-03-10: qty 2

## 2023-03-10 MED ORDER — POTASSIUM CHLORIDE CRYS ER 20 MEQ PO TBCR
40.0000 meq | EXTENDED_RELEASE_TABLET | ORAL | Status: AC
  Administered 2023-03-10 (×3): 40 meq via ORAL
  Filled 2023-03-10 (×3): qty 2

## 2023-03-10 NOTE — Telephone Encounter (Signed)
Pharmacy Patient Advocate Encounter  Insurance verification completed.    The patient is insured through [employer] Part D   The patient is currently admitted and ran test claims for the following: Brilinta.  Copays and coinsurance results were relayed to Inpatient clinical team.  

## 2023-03-10 NOTE — Progress Notes (Signed)
EKG CRITICAL VALUE     12 lead EKG performed.  Critical value noted.  Orinda Kenner, RN notified.   Orma Flaming,  03/10/2023 7:29 AM

## 2023-03-10 NOTE — Progress Notes (Signed)
  Echocardiogram 2D Echocardiogram has been performed.  Janalyn Harder 03/10/2023, 10:08 AM

## 2023-03-10 NOTE — Progress Notes (Signed)
Pt just ambulated with RN. Reports SOB toward end.   Discussed with pt and son MI, stents, restrictions, Brilinta importance, smoking cessation, and CRPII. Pt is generally overwhelmed by need for antiplatelet and also smoking cessation discussion. She is contemplative, gave her options for cessation and resources. Family came and she asked for continued education tomorrow. Will refer to [place] CRPII.  [id number] Ethelda Chick BS, ACSM-CEP 03/10/2023 2:00 PM

## 2023-03-10 NOTE — Progress Notes (Signed)
Rounding Note    Patient Name: Brittany Duffy Date of Encounter: 03/10/2023  [facility] Cardiologist: Verne Carrow, MD   Subjective   No acute events overnight.  This AM denies chest/neck pain or dyspnea.  EKG with residual STE inferiorly, trop > 24k  Inpatient Medications    Scheduled Meds:  aspirin EC  81 mg Oral Daily   Chlorhexidine Gluconate Cloth  6 each Topical Daily   furosemide  40 mg Intravenous Daily   losartan  25 mg Oral Daily   melatonin  5 mg Oral QHS   potassium chloride  40 mEq Oral Once   rosuvastatin  40 mg Oral Daily   sodium chloride flush  3 mL Intravenous Q12H   ticagrelor  90 mg Oral BID   Continuous Infusions:  sodium chloride     PRN Meds: sodium chloride, acetaminophen, ondansetron (ZOFRAN) IV, mouth rinse, oxyCODONE, sodium chloride flush, traZODone   Vital Signs    Vitals:   03/10/23 0100 03/10/23 0200 03/10/23 0300 03/10/23 0400  BP: 120/73 112/65 125/69 114/69  Pulse: 60 62 62 (!) 56  Resp: 16 18 19 16   Temp:   98.7 F (37.1 C)   TempSrc:   Oral   SpO2: 100% 99% 100% 100%  Weight:        Intake/Output Summary (Last 24 hours) at 03/10/2023 0754 Last data filed at 03/09/2023 2000 Gross per 24 hour  Intake 316.85 ml  Output --  Net 316.85 ml      03/09/2023    4:48 PM 02/19/2023    3:49 PM 09/15/2022    2:41 PM  Last 3 Weights  Weight (lbs) 145 lb 11.6 oz 145 lb 12.8 oz 140 lb  Weight (kg) 66.1 kg 66.134 kg 63.504 kg      Telemetry    SR with occ NSVT - Personally Reviewed  ECG    SR with evolving inferior MI pattern with residual STE - Personally Reviewed  Physical Exam   GEN: No acute distress.   Neck: No JVD Cardiac: RRR, no murmurs, rubs, or gallops.  Respiratory: Clear to auscultation bilaterally. GI: Soft, nontender, non-distended  MS: No edema; No deformity. R radial site CDI Neuro:  Nonfocal  Psych: Normal affect   Labs    High Sensitivity Troponin:   Recent Labs  Lab  03/09/23 1701 03/09/23 2041 03/10/23 0606  TROPONINIHS 120* 5,792* >24,000*     Chemistry Recent Labs  Lab 03/09/23 1701 03/09/23 1702 03/10/23 0606  NA 138 141 137  K 3.2* 3.3* 3.2*  CL 103 103 101  CO2 23  --  22  GLUCOSE 145* 146* 104*  BUN 8 8 11   CREATININE 0.96 0.80 0.80  CALCIUM 9.2  --  9.2  PROT 6.9  --  6.3*  ALBUMIN 3.9  --  3.5  AST 21  --  100*  ALT 13  --  22  ALKPHOS 55  --  50  BILITOT 0.4  --  0.4  GFRNONAA >60  --  >60  ANIONGAP 12  --  14    Lipids  Recent Labs  Lab 03/09/23 1701  CHOL 128  TRIG 107  HDL 52  LDLCALC 55  CHOLHDL 2.5    Hematology Recent Labs  Lab 03/09/23 1701 03/09/23 1702 03/10/23 0606  WBC 8.0  --  7.7  RBC 3.78*  --  3.51*  HGB 11.5* 11.9* 10.6*  HCT 34.1* 35.0* 31.4*  MCV 90.2  --  89.5  MCH 30.4  --  30.2  MCHC 33.7  --  33.8  RDW 13.2  --  13.5  PLT 195  --  163   Thyroid No results for input(s): "TSH", "FREET4" in the last 168 hours.  BNPNo results for input(s): "BNP", "PROBNP" in the last 168 hours.  DDimer No results for input(s): "DDIMER" in the last 168 hours.   Radiology    CARDIAC CATHETERIZATION  Result Date: 03/09/2023   Mid Cx lesion is 30% stenosed.   Prox RCA lesion is 100% stenosed.   Mid LAD lesion is 30% stenosed.   Dist LAD lesion is 30% stenosed.   Mid RCA lesion is 90% stenosed.   A drug-eluting stent was successfully placed using a STENT SYNERGY XD 3.0X32.   A drug-eluting stent was successfully placed using a SYNERGY XD 3.0X38.   Post intervention, there is a 0% residual stenosis.   Post intervention, there is a 0% residual stenosis. Acute inferior STEMI secondary to thrombotic occlusion of the mid RCA. Successful PTCA/DES x 2 mid RCA Mild non-obstructive disease in the LAD and Circumflex LVEDP=24 mmHg Recommendations: Will admit to the ICU. Aggrastat infusion for 2 hours post PCI. Continue DAPT with ASA and Brilinta for one year. High intensity statin. Will not start a beta blocker tonight  given hypotension during the case. Will review need for beta blocker therapy following the echo tomorrow.  Potential fast track discharge if LV function is normal by echo.    Cardiac Studies   Cath/PCI 4/30 Acute inferior STEMI secondary to thrombotic occlusion of the mid RCA.  Successful PTCA/DES x 2 mid RCA Mild non-obstructive disease in the LAD and Circumflex LVEDP=24 mmHg    Assessment & Plan     Inferior STEMI:  Cont DAPT x 1 year, atorva 80, losartan.  If EF down, will consider BB.  Rare NSVT on monitor, will replete K (check BMP at noon).  EKG with evolving changes, has no recurrent anginal symptoms, monitor for now. HL:  Atorvastatin 80 Tobacco abuse:  Stressed need for abstinence.  For questions or updates, please contact [facility] Please consult [email or web address] for contact info under        Signed, Orbie Pyo, MD  03/10/2023, 7:54 AM

## 2023-03-10 NOTE — TOC Benefit Eligibility Note (Signed)
Patient Product/process development scientist completed.    The patient is currently admitted and upon discharge could be taking Brilinta 90 mg.  The current 30 day co-pay is $11.20.   The patient is insured through [facility] Part D   This test claim was processed through [facility]- copay amounts may vary at other pharmacies due to pharmacy/plan contracts, or as the patient moves through the different stages of their insurance plan.  Roland Earl, CPHT Pharmacy Patient Advocate Specialist [facility] Patient Advocate Team Direct Number: [phone]  Fax: [phone]

## 2023-03-10 NOTE — Care Management (Signed)
  Transition of Care [facility]) Screening Note   Patient Details  Name: Brittany Duffy Date of Birth: 1956-08-30   [facility] of [facility] CM/SW Contact:    Gala Lewandowsky, RN Phone Number: 03/10/2023, 11:28 AM    Transition of Care Department [facility]) has reviewed the patient and no TOC needs have been identified at this time. Patient presented for chest pain- post LHC. Benefits check submitted for Brilinta cost. We will continue to monitor patient advancement through interdisciplinary progression rounds. If new patient transition needs arise, please place a TOC consult.

## 2023-03-11 ENCOUNTER — Telehealth: Payer: Self-pay

## 2023-03-11 ENCOUNTER — Other Ambulatory Visit (HOSPITAL_COMMUNITY): Payer: Self-pay

## 2023-03-11 ENCOUNTER — Ambulatory Visit: Payer: Medicare Other

## 2023-03-11 DIAGNOSIS — I2119 ST elevation (STEMI) myocardial infarction involving other coronary artery of inferior wall: Secondary | ICD-10-CM | POA: Diagnosis not present

## 2023-03-11 LAB — BASIC METABOLIC PANEL
Anion gap: 6 (ref 5–15)
BUN: 10 mg/dL (ref 8–23)
CO2: 24 mmol/L (ref 22–32)
Calcium: 9.4 mg/dL (ref 8.9–10.3)
Chloride: 110 mmol/L (ref 98–111)
Creatinine, Ser: 0.76 mg/dL (ref 0.44–1.00)
GFR, Estimated: >60 mL/min (ref >60)
Glucose, Bld: 112 mg/dL — ABNORMAL HIGH (ref 70–99)
Potassium: 4.4 mmol/L (ref 3.5–5.1)
Sodium: 140 mmol/L (ref 135–145)

## 2023-03-11 LAB — MAGNESIUM: Magnesium: 2.2 mg/dL (ref 1.7–2.4)

## 2023-03-11 MED ORDER — TICAGRELOR 90 MG PO TABS
90.0000 mg | ORAL_TABLET | Freq: Two times a day (BID) | ORAL | 3 refills | Status: DC
  Filled 2023-03-11 – 2023-04-16 (×2): qty 60, 30d supply, fill #0
  Filled 2023-05-25: qty 60, 30d supply, fill #1
  Filled 2023-06-21: qty 60, 30d supply, fill #2

## 2023-03-11 MED ORDER — POTASSIUM CHLORIDE CRYS ER 20 MEQ PO TBCR
20.0000 meq | EXTENDED_RELEASE_TABLET | Freq: Every day | ORAL | 3 refills | Status: DC
  Filled 2023-03-11 – 2023-04-16 (×2): qty 30, 30d supply, fill #0
  Filled 2023-05-25: qty 30, 30d supply, fill #1
  Filled 2023-06-21: qty 30, 30d supply, fill #2

## 2023-03-11 MED ORDER — ROSUVASTATIN CALCIUM 40 MG PO TABS
40.0000 mg | ORAL_TABLET | Freq: Every day | ORAL | 3 refills | Status: DC
  Filled 2023-03-11 – 2023-04-16 (×2): qty 30, 30d supply, fill #0
  Filled 2023-05-24: qty 30, 30d supply, fill #1
  Filled 2023-06-21: qty 30, 30d supply, fill #2

## 2023-03-11 MED ORDER — FUROSEMIDE 20 MG PO TABS
20.0000 mg | ORAL_TABLET | Freq: Two times a day (BID) | ORAL | 3 refills | Status: DC
  Filled 2023-03-11 – 2023-04-16 (×2): qty 60, 30d supply, fill #0
  Filled 2023-05-25: qty 60, 30d supply, fill #1
  Filled 2023-06-21: qty 60, 30d supply, fill #2

## 2023-03-11 NOTE — Progress Notes (Signed)
Discharge instructions given. Patient verbalized understanding and all questions were answered.  ?

## 2023-03-11 NOTE — Discharge Summary (Addendum)
Discharge Summary    Patient ID: Brittany Duffy MRN: [id number]; DOB: 1955/11/21  Admit date: 03/09/2023 Discharge date: 03/11/2023  PCP:  [name], NP   [facility] Providers Cardiologist:  Verne Carrow, MD        Discharge Diagnoses    Principal Problem:   Acute ST elevation myocardial infarction (STEMI) of inferior wall [facility])    Diagnostic Studies/Procedures    03/09/23 LHC    Mid Cx lesion is 30% stenosed.   Prox RCA lesion is 100% stenosed.   Mid LAD lesion is 30% stenosed.   Dist LAD lesion is 30% stenosed.   Mid RCA lesion is 90% stenosed.   A drug-eluting stent was successfully placed using a STENT SYNERGY XD 3.0X32.   A drug-eluting stent was successfully placed using a SYNERGY XD 3.0X38.   Post intervention, there is a 0% residual stenosis.   Post intervention, there is a 0% residual stenosis.   Acute inferior STEMI secondary to thrombotic occlusion of the mid RCA.  Successful PTCA/DES x 2 mid RCA Mild non-obstructive disease in the LAD and Circumflex LVEDP=24 mmHg   Recommendations: Will admit to the ICU. Aggrastat infusion for 2 hours post PCI. Continue DAPT with ASA and Brilinta for one year. High intensity statin. Will not start a beta blocker tonight given hypotension during the case. Will review need for beta blocker therapy following the echo tomorrow.  Potential fast track discharge if LV function is normal by echo.  Diagnostic Dominance: Right  Intervention      03/10/23 TTE  IMPRESSIONS     1. Left ventricular ejection fraction, by estimation, is 65 to 70%. The  left ventricle has normal function. The left ventricle has no regional  wall motion abnormalities. There is mild concentric left ventricular  hypertrophy. Left ventricular diastolic  parameters are consistent with Grade I diastolic dysfunction (impaired  relaxation).   2. Right ventricular systolic function is normal. The right ventricular  size  is normal. There is moderately elevated pulmonary artery systolic  pressure. The estimated right ventricular systolic pressure is 47.3 mmHg.   3. The mitral valve is normal in structure. Trivial mitral valve  regurgitation. No evidence of mitral stenosis.   4. The aortic valve is tricuspid. Aortic valve regurgitation is not  visualized. Aortic valve sclerosis is present, with no evidence of aortic  valve stenosis.   Comparison(s): No prior Echocardiogram.   FINDINGS   Left Ventricle: Intra-cavtiary gradient due to hyperdynamic function.  Left ventricular ejection fraction, by estimation, is 65 to 70%. The left  ventricle has normal function. The left ventricle has no regional wall  motion abnormalities. The left  ventricular internal cavity size was normal in size. There is mild  concentric left ventricular hypertrophy. Left ventricular diastolic  parameters are consistent with Grade I diastolic dysfunction (impaired  relaxation).   Right Ventricle: The right ventricular size is normal. No increase in  right ventricular wall thickness. Right ventricular systolic function is  normal. There is moderately elevated pulmonary artery systolic pressure.  The tricuspid regurgitant velocity is  2.84 m/s, and with an assumed right atrial pressure of 15 mmHg, the  estimated right ventricular systolic pressure is 47.3 mmHg.   Left Atrium: Left atrial size was normal in size.   Right Atrium: Right atrial size was normal in size.   Pericardium: There is no evidence of pericardial effusion.   Mitral Valve: The mitral valve is normal in structure. Trivial mitral  valve regurgitation.  No evidence of mitral valve stenosis.   Tricuspid Valve: The tricuspid valve is normal in structure. Tricuspid  valve regurgitation is mild . No evidence of tricuspid stenosis.   Aortic Valve: The aortic valve is tricuspid. Aortic valve regurgitation is  not visualized. Aortic valve sclerosis is present, with no  evidence of  aortic valve stenosis. Aortic valve mean gradient measures 5.0 mmHg.  Aortic valve peak gradient measures  12.8 mmHg. Aortic valve area, by VTI measures 2.42 cm.   Pulmonic Valve: The pulmonic valve was not well visualized. Pulmonic valve  regurgitation is not visualized. No evidence of pulmonic stenosis.   Aorta: The aortic root is normal in size and structure and the ascending  aorta was not well visualized.   IAS/Shunts: The interatrial septum was not well visualized.  _____________   History of Present Illness     Brittany Duffy is a 67 yo female with history of CAD by chest CT, HTN and tobacco abuse who presented to [facility] via EMS with c/o chest pain. EKG with 2 mm inferior ST elevation and ST depression in the lateral leads. Code STEMI called by EMS. Pt with 7/10 chest pain on arrival to [facility]. Pt taken for emergent cardiac cath.   Hospital Course     Consultants: n/a   CAD Inferior STEMI s/p PCI  Patient admitted following STEMI activation/urgent PCI to RCA. No chest pain, minimal dyspnea with cardiac rehab ambulation today. She will discharge on the following GDMT:  ASA 81mg  with Brilinta 90mg  BID Crestor 40mg   No beta blocker given preserved LVEF with no WMA per discussion with Dr. Fabio Pierce REDUCE-AMI Trial.  Hypertension  Patient started on Losartan 25mg , now with BP at goal.   Hyperlipidemia  Patient transitioned to high intensity statin, Crestor 40mg  at discharge. Lipoprotein A is still pending.   Lab Results  Component Value Date   CHOL 128 03/09/2023   HDL 52 03/09/2023   LDLCALC 55 03/09/2023   TRIG 107 03/09/2023   CHOLHDL 2.5 03/09/2023   Pulmonary hypertension  LVEDP elevated with pulmonary venous hypertension on TTE. Patient received IV lasix inpatient and will discharge on lasix 20mg  PO BID with potassium supplementation.   Tobacco use disorder  Patient was an active smoker at the time of admission. Importance of  complete cessation reiterated.       Did the patient have an acute coronary syndrome (MI, NSTEMI, STEMI, etc) this admission?:  Yes                               AHA/ACC Clinical Performance & Quality Measures: Aspirin prescribed? - Yes ADP Receptor Inhibitor (Plavix/Clopidogrel, Brilinta/Ticagrelor or Effient/Prasugrel) prescribed (includes medically managed patients)? - Yes Beta Blocker prescribed? - No - LVEF preserved. REDUCE-AMI Trial High Intensity Statin (Lipitor 40-80mg  or Crestor 20-40mg ) prescribed? - Yes EF assessed during THIS hospitalization? - Yes For EF <40%, was ACEI/ARB prescribed? - Not Applicable (EF >/= 40%) For EF <40%, Aldosterone Antagonist (Spironolactone or Eplerenone) prescribed? - Not Applicable (EF >/= 40%) Cardiac Rehab Phase II ordered (including medically managed patients)? - Yes       The patient will be scheduled for a TOC follow up appointment in 14 days.  A message has been sent to the [facility] and Scheduling Pool at the office where the patient should be seen for follow up.  _____________  Discharge Vitals Blood pressure 117/71, pulse 71, temperature 98.2 F (36.8 C), temperature  source Oral, resp. rate 18, weight 64.8 kg, last menstrual period 01/23/2012, SpO2 100 %.  Filed Weights   03/09/23 1648 03/10/23 2221  Weight: 66.1 kg 64.8 kg   Physical Exam Vitals and nursing note reviewed.  Constitutional:      Appearance: Normal appearance.  HENT:     Head: Normocephalic.  Eyes:     Pupils: Pupils are equal, round, and reactive to light.  Cardiovascular:     Rate and Rhythm: Normal rate and regular rhythm.     Pulses: Normal pulses.     Heart sounds: Normal heart sounds. No murmur heard. Pulmonary:     Effort: Pulmonary effort is normal.     Breath sounds: Normal breath sounds. No wheezing, rhonchi or rales.  Abdominal:     General: Abdomen is flat.  Musculoskeletal:        General: Normal range of motion.     Right lower leg: No edema.      Left lower leg: No edema.  Skin:    General: Skin is warm and dry.     Capillary Refill: Capillary refill takes less than 2 seconds.  Neurological:     General: No focal deficit present.     Mental Status: She is alert and oriented to person, place, and time.  Psychiatric:        Mood and Affect: Mood normal.        Behavior: Behavior normal.        Thought Content: Thought content normal.        Judgment: Judgment normal.      Labs & Radiologic Studies    CBC Recent Labs    03/09/23 1701 03/09/23 1702 03/10/23 0606  WBC 8.0  --  7.7  NEUTROABS 5.7  --   --   HGB 11.5* 11.9* 10.6*  HCT 34.1* 35.0* 31.4*  MCV 90.2  --  89.5  PLT 195  --  163   Basic Metabolic Panel Recent Labs    16/10/96 1155 03/11/23 0214  NA 139 140  K 3.1* 4.4  CL 102 110  CO2 26 24  GLUCOSE 133* 112*  BUN 10 10  CREATININE 0.86 0.76  CALCIUM 9.6 9.4  MG 2.1 2.2   Liver Function Tests Recent Labs    03/09/23 1701 03/10/23 0606  AST 21 100*  ALT 13 22  ALKPHOS 55 50  BILITOT 0.4 0.4  PROT 6.9 6.3*  ALBUMIN 3.9 3.5   No results for input(s): "LIPASE", "AMYLASE" in the last 72 hours. High Sensitivity Troponin:   Recent Labs  Lab 03/09/23 1701 03/09/23 2041 03/10/23 0606  TROPONINIHS 120* 5,792* >24,000*    BNP Invalid input(s): "POCBNP" D-Dimer No results for input(s): "DDIMER" in the last 72 hours. Hemoglobin A1C Recent Labs    03/09/23 1701  HGBA1C 6.1*   Fasting Lipid Panel Recent Labs    03/09/23 1701  CHOL 128  HDL 52  LDLCALC 55  TRIG 107  CHOLHDL 2.5   Thyroid Function Tests No results for input(s): "TSH", "T4TOTAL", "T3FREE", "THYROIDAB" in the last 72 hours.  Invalid input(s): "FREET3" _____________  ECHOCARDIOGRAM COMPLETE  Result Date: 03/10/2023    ECHOCARDIOGRAM REPORT   Patient Name:   Brittany Duffy Date of Exam: 03/10/2023 Medical Rec #:  [id number]              Height:       60.0 in Accession #:    [id number]  Weight:        145.7 lb Date of Birth:  08/29/1956              BSA:          1.632 m Patient Age:    66 years               BP:           124/67 mmHg Patient Gender: F                      HR:           53 bpm. Exam Location:  Inpatient Procedure: 2D Echo, Cardiac Doppler and Color Doppler Indications:    ; I25.110 Atherosclerotic heart disease of native coronary                 artery with unstable angina pectoris  History:        Patient has no prior history of Echocardiogram examinations.                 Acute MI; Risk Factors:Dyslipidemia and Current Smoker.  Sonographer:    Sheralyn Boatman RDCS Referring Phys: [id number] CHRISTOPHER D [facility]  Sonographer Comments: Image acquisition challenging due to patient body habitus. Patient is post PCI. IMPRESSIONS  1. Left ventricular ejection fraction, by estimation, is 65 to 70%. The left ventricle has normal function. The left ventricle has no regional wall motion abnormalities. There is mild concentric left ventricular hypertrophy. Left ventricular diastolic parameters are consistent with Grade I diastolic dysfunction (impaired relaxation).  2. Right ventricular systolic function is normal. The right ventricular size is normal. There is moderately elevated pulmonary artery systolic pressure. The estimated right ventricular systolic pressure is 47.3 mmHg.  3. The mitral valve is normal in structure. Trivial mitral valve regurgitation. No evidence of mitral stenosis.  4. The aortic valve is tricuspid. Aortic valve regurgitation is not visualized. Aortic valve sclerosis is present, with no evidence of aortic valve stenosis. Comparison(s): No prior Echocardiogram. FINDINGS  Left Ventricle: Intra-cavtiary gradient due to hyperdynamic function. Left ventricular ejection fraction, by estimation, is 65 to 70%. The left ventricle has normal function. The left ventricle has no regional wall motion abnormalities. The left ventricular internal cavity size was normal in size. There is mild concentric  left ventricular hypertrophy. Left ventricular diastolic parameters are consistent with Grade I diastolic dysfunction (impaired relaxation). Right Ventricle: The right ventricular size is normal. No increase in right ventricular wall thickness. Right ventricular systolic function is normal. There is moderately elevated pulmonary artery systolic pressure. The tricuspid regurgitant velocity is 2.84 m/s, and with an assumed right atrial pressure of 15 mmHg, the estimated right ventricular systolic pressure is 47.3 mmHg. Left Atrium: Left atrial size was normal in size. Right Atrium: Right atrial size was normal in size. Pericardium: There is no evidence of pericardial effusion. Mitral Valve: The mitral valve is normal in structure. Trivial mitral valve regurgitation. No evidence of mitral valve stenosis. Tricuspid Valve: The tricuspid valve is normal in structure. Tricuspid valve regurgitation is mild . No evidence of tricuspid stenosis. Aortic Valve: The aortic valve is tricuspid. Aortic valve regurgitation is not visualized. Aortic valve sclerosis is present, with no evidence of aortic valve stenosis. Aortic valve mean gradient measures 5.0 mmHg. Aortic valve peak gradient measures 12.8 mmHg. Aortic valve area, by VTI measures 2.42 cm. Pulmonic Valve: The pulmonic valve was not well visualized. Pulmonic valve regurgitation is not visualized. No  evidence of pulmonic stenosis. Aorta: The aortic root is normal in size and structure and the ascending aorta was not well visualized. IAS/Shunts: The interatrial septum was not well visualized.  LEFT VENTRICLE PLAX 2D LVIDd:         4.30 cm   Diastology LVIDs:         2.60 cm   LV e' medial:    5.44 cm/s LV PW:         1.00 cm   LV E/e' medial:  20.6 LV IVS:        1.10 cm   LV e' lateral:   9.36 cm/s LVOT diam:     2.00 cm   LV E/e' lateral: 12.0 LV SV:         78 LV SV Index:   48 LVOT Area:     3.14 cm  RIGHT VENTRICLE         IVC TAPSE (M-mode): 1.3 cm  IVC diam: 1.90  cm LEFT ATRIUM             Index        RIGHT ATRIUM          Index LA diam:        2.20 cm 1.35 cm/m   RA Area:     8.67 cm LA Vol (A2C):   39.3 ml 24.09 ml/m  RA Volume:   13.70 ml 8.40 ml/m LA Vol (A4C):   30.2 ml 18.51 ml/m LA Biplane Vol: 34.6 ml 21.21 ml/m  AORTIC VALVE AV Area (Vmax):    2.30 cm AV Area (Vmean):   2.55 cm AV Area (VTI):     2.42 cm AV Vmax:           179.00 cm/s AV Vmean:          94.100 cm/s AV VTI:            0.320 m AV Peak Grad:      12.8 mmHg AV Mean Grad:      5.0 mmHg LVOT Vmax:         131.00 cm/s LVOT Vmean:        76.400 cm/s LVOT VTI:          0.247 m LVOT/AV VTI ratio: 0.77  AORTA Ao Asc diam: 3.10 cm MITRAL VALVE                TRICUSPID VALVE MV Area (PHT): 3.77 cm     TR Peak grad:   32.3 mmHg MV Decel Time: 201 msec     TR Vmax:        284.00 cm/s MV E velocity: 112.00 cm/s MV A velocity: 106.50 cm/s  SHUNTS MV E/A ratio:  1.05         Systemic VTI:  0.25 m                             Systemic Diam: 2.00 cm Riley Lam MD Electronically signed by Riley Lam MD Signature Date/Time: 03/10/2023/11:06:29 AM    Final    CARDIAC CATHETERIZATION  Result Date: 03/09/2023   Mid Cx lesion is 30% stenosed.   Prox RCA lesion is 100% stenosed.   Mid LAD lesion is 30% stenosed.   Dist LAD lesion is 30% stenosed.   Mid RCA lesion is 90% stenosed.   A drug-eluting stent was successfully placed using a STENT SYNERGY XD 3.0X32.   A drug-eluting stent was  successfully placed using a SYNERGY XD 3.0X38.   Post intervention, there is a 0% residual stenosis.   Post intervention, there is a 0% residual stenosis. Acute inferior STEMI secondary to thrombotic occlusion of the mid RCA. Successful PTCA/DES x 2 mid RCA Mild non-obstructive disease in the LAD and Circumflex LVEDP=24 mmHg Recommendations: Will admit to the ICU. Aggrastat infusion for 2 hours post PCI. Continue DAPT with ASA and Brilinta for one year. High intensity statin. Will not start a beta blocker tonight  given hypotension during the case. Will review need for beta blocker therapy following the echo tomorrow.  Potential fast track discharge if LV function is normal by echo.   Disposition   Pt is being discharged home today in good condition.  Follow-up Plans & Appointments     Discharge Instructions     Amb Referral to Cardiac Rehabilitation   Complete by: As directed    Diagnosis:  Coronary Stents STEMI PTCA     After initial evaluation and assessments completed: Virtual Based Care may be provided alone or in conjunction with Phase 2 Cardiac Rehab based on patient barriers.: Yes   Intensive Cardiac Rehabilitation (ICR) [facility] location only OR Traditional Cardiac Rehabilitation (TCR) *If criteria for ICR are not met will enroll in TCR [facility] only): Yes        Discharge Medications   Allergies as of 03/11/2023   No Known Allergies      Medication List     TAKE these medications    ALPRAZolam 0.5 MG tablet Commonly known as: XANAX Take 0.5 mg by mouth.   aspirin EC 81 MG tablet Take 1 tablet (81 mg total) by mouth daily. Swallow whole.   CALCIUM PO Take 1 tablet by mouth daily.   cetirizine 10 MG tablet Commonly known as: ZYRTEC Take 10 mg by mouth 2 (two) times daily.   fish oil-omega-3 fatty acids 1000 MG capsule Take 2 g by mouth daily.   furosemide 20 MG tablet Commonly known as: Lasix Take 1 tablet (20 mg total) by mouth 2 (two) times daily.   GARLIC PO Take by mouth.   losartan 25 MG tablet Commonly known as: COZAAR Take 1 tablet (25 mg total) by mouth daily.   MILK THISTLE PO Take by mouth.   MULTIVITAMIN PO Take 1 tablet by mouth daily. Reported on 04/28/2016   potassium chloride SA 20 MEQ tablet Commonly known as: KLOR-CON M Take 1 tablet (20 mEq total) by mouth daily.   rosuvastatin 40 MG tablet Commonly known as: CRESTOR Take 1 tablet (40 mg total) by mouth daily. Start taking on: Mar 12, 2023 What changed:  medication strength how much  to take   ticagrelor 90 MG Tabs tablet Commonly known as: BRILINTA Take 1 tablet (90 mg total) by mouth 2 (two) times daily.   VITAMIN A PO Take by mouth.   VITAMIN B 12 PO Take by mouth.   VITAMIN C PO Take 1 tablet by mouth daily.   VITAMIN D PO Take by mouth.   VITAMIN E PO Take 1 tablet by mouth daily.           Outstanding Labs/Studies     Duration of Discharge Encounter   Greater than 30 minutes including physician time.  Con Memos, PA-C 03/11/2023, 11:44 AM  ATTENDING ATTESTATION:  After conducting a review of all available clinical information with the care team, interviewing the patient, and performing a physical exam, I agree with the findings and plan described  in this note.   GEN: No acute distress.   HEENT:  MMM, no JVD, no scleral icterus Cardiac: RRR, no murmurs, rubs, or gallops.  Respiratory: Clear to auscultation bilaterally. GI: Soft, nontender, non-distended  MS: No edema; No deformity. Neuro:  Nonfocal  Vasc:  R radial dressing  Patient is doing well today.  TTE showed preserved LV function, LVH, and PHTN.  Discharge today with plan as above and below:   Inferior STEMI:  Cont DAPT x 1 year, atorva 80, losartan.  No BB given preserved EF.  Discharge today with cardiology follow up HL:  Atorvastatin 80 Tobacco abuse:  Stressed need for abstinence. PHTN:  On TTE; due to high LVEDP>>pulm venous hypertension; start lasix 20mg  PO BID   Alverda Skeans, MD Pager [phone]

## 2023-03-11 NOTE — Progress Notes (Signed)
CARDIAC REHAB PHASE I   PRE:  Rate/Rhythm: 71 NSR  BP:  Sitting: 117/71      SaO2: 98 RA  MODE:  Ambulation: 470 ft   AD:  None  POST:  Rate/Rhythm: 98 NSR  BP:  Sitting: Not taken, pt had to use BR      SaO2: 98 RA  Pt amb with supervision assist  Pt re-educated on HH diet, CRP2, restrictions, ex guidelines. All questions answered prior to leaving.   Faustino Congress  11:29 AM 03/11/2023    Service time is from 1030 to 1132.

## 2023-03-11 NOTE — Progress Notes (Signed)
Rounding Note    Patient Name: Brittany Duffy Date of Encounter: 03/11/2023  [facility] Cardiologist: Verne Carrow, MD   Subjective   No acute events overnight.    TTE with preserved EF, LVH, pulmonary HTN  Inpatient Medications    Scheduled Meds:  aspirin EC  81 mg Oral Daily   Chlorhexidine Gluconate Cloth  6 each Topical Daily   furosemide  40 mg Intravenous Daily   losartan  25 mg Oral Daily   melatonin  5 mg Oral QHS   rosuvastatin  40 mg Oral Daily   sodium chloride flush  3 mL Intravenous Q12H   ticagrelor  90 mg Oral BID   Continuous Infusions:  sodium chloride     PRN Meds: sodium chloride, acetaminophen, ondansetron (ZOFRAN) IV, mouth rinse, oxyCODONE, sodium chloride flush, traZODone   Vital Signs    Vitals:   03/10/23 2100 03/10/23 2221 03/10/23 2344 03/11/23 0838  BP: 139/81 133/82 99/67 117/71  Pulse: 74 71 64 71  Resp: (!) 24 20 19 18   Temp:  98.2 F (36.8 C) 98.4 F (36.9 C) 98.2 F (36.8 C)  TempSrc:  Oral Oral Oral  SpO2: 100% 100% 100% 100%  Weight:  64.8 kg      Intake/Output Summary (Last 24 hours) at 03/11/2023 1038 Last data filed at 03/10/2023 2110 Gross per 24 hour  Intake 10 ml  Output 1670 ml  Net -1660 ml      03/10/2023   10:21 PM 03/09/2023    4:48 PM 02/19/2023    3:49 PM  Last 3 Weights  Weight (lbs) 142 lb 12.8 oz 145 lb 11.6 oz 145 lb 12.8 oz  Weight (kg) 64.774 kg 66.1 kg 66.134 kg      Telemetry    SR - Personally Reviewed  ECG    SR with evolving inferior MI pattern with residual STE - Personally Reviewed  Physical Exam   GEN: No acute distress.   Neck: No JVD Cardiac: RRR, no murmurs, rubs, or gallops.  Respiratory: Clear to auscultation bilaterally. GI: Soft, nontender, non-distended  MS: No edema; No deformity. R radial site CDI Neuro:  Nonfocal  Psych: Normal affect   Labs    High Sensitivity Troponin:   Recent Labs  Lab 03/09/23 1701 03/09/23 2041 03/10/23 0606   TROPONINIHS 120* 5,792* >24,000*     Chemistry Recent Labs  Lab 03/09/23 1701 03/09/23 1702 03/10/23 0606 03/10/23 1155 03/11/23 0214  NA 138   < > 137 139 140  K 3.2*   < > 3.2* 3.1* 4.4  CL 103   < > 101 102 110  CO2 23  --  22 26 24   GLUCOSE 145*   < > 104* 133* 112*  BUN 8   < > 11 10 10   CREATININE 0.96   < > 0.80 0.86 0.76  CALCIUM 9.2  --  9.2 9.6 9.4  MG  --   --   --  2.1 2.2  PROT 6.9  --  6.3*  --   --   ALBUMIN 3.9  --  3.5  --   --   AST 21  --  100*  --   --   ALT 13  --  22  --   --   ALKPHOS 55  --  50  --   --   BILITOT 0.4  --  0.4  --   --   GFRNONAA >60  --  >60 >60 >60  ANIONGAP 12  --  14 11 6    < > = values in this interval not displayed.    Lipids  Recent Labs  Lab 03/09/23 1701  CHOL 128  TRIG 107  HDL 52  LDLCALC 55  CHOLHDL 2.5    Hematology Recent Labs  Lab 03/09/23 1701 03/09/23 1702 03/10/23 0606  WBC 8.0  --  7.7  RBC 3.78*  --  3.51*  HGB 11.5* 11.9* 10.6*  HCT 34.1* 35.0* 31.4*  MCV 90.2  --  89.5  MCH 30.4  --  30.2  MCHC 33.7  --  33.8  RDW 13.2  --  13.5  PLT 195  --  163   Thyroid No results for input(s): "TSH", "FREET4" in the last 168 hours.  BNPNo results for input(s): "BNP", "PROBNP" in the last 168 hours.  DDimer No results for input(s): "DDIMER" in the last 168 hours.   Radiology    ECHOCARDIOGRAM COMPLETE  Result Date: 03/10/2023    ECHOCARDIOGRAM REPORT   Patient Name:   Brittany Duffy Date of Exam: 03/10/2023 Medical Rec #:  [id number]              Height:       60.0 in Accession #:    [id number]             Weight:       145.7 lb Date of Birth:  [birth year]              BSA:          1.632 m Patient Age:    66 years               BP:           124/67 mmHg Patient Gender: F                      HR:           53 bpm. Exam Location:  Inpatient Procedure: 2D Echo, Cardiac Doppler and Color Doppler Indications:    ; I25.110 Atherosclerotic heart disease of native coronary                 artery with unstable  angina pectoris  History:        Patient has no prior history of Echocardiogram examinations.                 Acute MI; Risk Factors:Dyslipidemia and Current Smoker.  Sonographer:    Sheralyn Boatman RDCS Referring Phys: [id number] CHRISTOPHER D [facility]  Sonographer Comments: Image acquisition challenging due to patient body habitus. Patient is post PCI. IMPRESSIONS  1. Left ventricular ejection fraction, by estimation, is 65 to 70%. The left ventricle has normal function. The left ventricle has no regional wall motion abnormalities. There is mild concentric left ventricular hypertrophy. Left ventricular diastolic parameters are consistent with Grade I diastolic dysfunction (impaired relaxation).  2. Right ventricular systolic function is normal. The right ventricular size is normal. There is moderately elevated pulmonary artery systolic pressure. The estimated right ventricular systolic pressure is 47.3 mmHg.  3. The mitral valve is normal in structure. Trivial mitral valve regurgitation. No evidence of mitral stenosis.  4. The aortic valve is tricuspid. Aortic valve regurgitation is not visualized. Aortic valve sclerosis is present, with no evidence of aortic valve stenosis. Comparison(s): No prior Echocardiogram. FINDINGS  Left Ventricle: Intra-cavtiary gradient due to hyperdynamic function. Left ventricular ejection fraction, by estimation, is 65  to 70%. The left ventricle has normal function. The left ventricle has no regional wall motion abnormalities. The left ventricular internal cavity size was normal in size. There is mild concentric left ventricular hypertrophy. Left ventricular diastolic parameters are consistent with Grade I diastolic dysfunction (impaired relaxation). Right Ventricle: The right ventricular size is normal. No increase in right ventricular wall thickness. Right ventricular systolic function is normal. There is moderately elevated pulmonary artery systolic pressure. The tricuspid regurgitant velocity  is 2.84 m/s, and with an assumed right atrial pressure of 15 mmHg, the estimated right ventricular systolic pressure is 47.3 mmHg. Left Atrium: Left atrial size was normal in size. Right Atrium: Right atrial size was normal in size. Pericardium: There is no evidence of pericardial effusion. Mitral Valve: The mitral valve is normal in structure. Trivial mitral valve regurgitation. No evidence of mitral valve stenosis. Tricuspid Valve: The tricuspid valve is normal in structure. Tricuspid valve regurgitation is mild . No evidence of tricuspid stenosis. Aortic Valve: The aortic valve is tricuspid. Aortic valve regurgitation is not visualized. Aortic valve sclerosis is present, with no evidence of aortic valve stenosis. Aortic valve mean gradient measures 5.0 mmHg. Aortic valve peak gradient measures 12.8 mmHg. Aortic valve area, by VTI measures 2.42 cm. Pulmonic Valve: The pulmonic valve was not well visualized. Pulmonic valve regurgitation is not visualized. No evidence of pulmonic stenosis. Aorta: The aortic root is normal in size and structure and the ascending aorta was not well visualized. IAS/Shunts: The interatrial septum was not well visualized.  LEFT VENTRICLE PLAX 2D LVIDd:         4.30 cm   Diastology LVIDs:         2.60 cm   LV e' medial:    5.44 cm/s LV PW:         1.00 cm   LV E/e' medial:  20.6 LV IVS:        1.10 cm   LV e' lateral:   9.36 cm/s LVOT diam:     2.00 cm   LV E/e' lateral: 12.0 LV SV:         78 LV SV Index:   48 LVOT Area:     3.14 cm  RIGHT VENTRICLE         IVC TAPSE (M-mode): 1.3 cm  IVC diam: 1.90 cm LEFT ATRIUM             Index        RIGHT ATRIUM          Index LA diam:        2.20 cm 1.35 cm/m   RA Area:     8.67 cm LA Vol (A2C):   39.3 ml 24.09 ml/m  RA Volume:   13.70 ml 8.40 ml/m LA Vol (A4C):   30.2 ml 18.51 ml/m LA Biplane Vol: 34.6 ml 21.21 ml/m  AORTIC VALVE AV Area (Vmax):    2.30 cm AV Area (Vmean):   2.55 cm AV Area (VTI):     2.42 cm AV Vmax:           179.00  cm/s AV Vmean:          94.100 cm/s AV VTI:            0.320 m AV Peak Grad:      12.8 mmHg AV Mean Grad:      5.0 mmHg LVOT Vmax:         131.00 cm/s LVOT Vmean:  76.400 cm/s LVOT VTI:          0.247 m LVOT/AV VTI ratio: 0.77  AORTA Ao Asc diam: 3.10 cm MITRAL VALVE                TRICUSPID VALVE MV Area (PHT): 3.77 cm     TR Peak grad:   32.3 mmHg MV Decel Time: 201 msec     TR Vmax:        284.00 cm/s MV E velocity: 112.00 cm/s MV A velocity: 106.50 cm/s  SHUNTS MV E/A ratio:  1.05         Systemic VTI:  0.25 m                             Systemic Diam: 2.00 cm Riley Lam MD Electronically signed by Riley Lam MD Signature Date/Time: 03/10/2023/11:06:29 AM    Final    CARDIAC CATHETERIZATION  Result Date: 03/09/2023   Mid Cx lesion is 30% stenosed.   Prox RCA lesion is 100% stenosed.   Mid LAD lesion is 30% stenosed.   Dist LAD lesion is 30% stenosed.   Mid RCA lesion is 90% stenosed.   A drug-eluting stent was successfully placed using a STENT SYNERGY XD 3.0X32.   A drug-eluting stent was successfully placed using a SYNERGY XD 3.0X38.   Post intervention, there is a 0% residual stenosis.   Post intervention, there is a 0% residual stenosis. Acute inferior STEMI secondary to thrombotic occlusion of the mid RCA. Successful PTCA/DES x 2 mid RCA Mild non-obstructive disease in the LAD and Circumflex LVEDP=24 mmHg Recommendations: Will admit to the ICU. Aggrastat infusion for 2 hours post PCI. Continue DAPT with ASA and Brilinta for one year. High intensity statin. Will not start a beta blocker tonight given hypotension during the case. Will review need for beta blocker therapy following the echo tomorrow.  Potential fast track discharge if LV function is normal by echo.    Cardiac Studies   Cath/PCI 4/30 Acute inferior STEMI secondary to thrombotic occlusion of the mid RCA.  Successful PTCA/DES x 2 mid RCA Mild non-obstructive disease in the LAD and Circumflex LVEDP=24  mmHg  TTE 5/1 1. Left ventricular ejection fraction, by estimation, is 65 to 70%. The  left ventricle has normal function. The left ventricle has no regional  wall motion abnormalities. There is mild concentric left ventricular  hypertrophy. Left ventricular diastolic  parameters are consistent with Grade I diastolic dysfunction (impaired  relaxation).   2. Right ventricular systolic function is normal. The right ventricular  size is normal. There is moderately elevated pulmonary artery systolic  pressure. The estimated right ventricular systolic pressure is 47.3 mmHg.   3. The mitral valve is normal in structure. Trivial mitral valve  regurgitation. No evidence of mitral stenosis.   4. The aortic valve is tricuspid. Aortic valve regurgitation is not  visualized. Aortic valve sclerosis is present, with no evidence of aortic  valve stenosis.   Assessment & Plan     Inferior STEMI:  Cont DAPT x 1 year, atorva 80, losartan.  No BB given preserved EF.  Discharge today with cardiology follow up HL:  Atorvastatin 80 Tobacco abuse:  Stressed need for abstinence. PHTN:  On TTE; due to high LVEDP>>pulm venous hypertension; start lasix 20mg  PO BID  For questions or updates, please contact [facility] Please consult [email or web address] for contact info under  Signed, Orbie Pyo, MD  03/11/2023, 10:38 AM

## 2023-03-11 NOTE — Telephone Encounter (Signed)
**Note De-identified Zadiel Leyh Obfuscation** -----  **Note De-Identified Orella Cushman Obfuscation** Message from Perlie Gold, PA-C sent at 03/11/2023 11:32 AM EDT ----- Regarding: TOC call This patient has a TOC appointment with Tereso Newcomer on 5/15 and needs a TOC call. Thanks!  Perlie Gold PA-C

## 2023-03-12 ENCOUNTER — Other Ambulatory Visit: Payer: Self-pay | Admitting: Family

## 2023-03-12 ENCOUNTER — Telehealth: Payer: Self-pay | Admitting: *Deleted

## 2023-03-12 ENCOUNTER — Encounter: Payer: Self-pay | Admitting: *Deleted

## 2023-03-12 DIAGNOSIS — F17200 Nicotine dependence, unspecified, uncomplicated: Secondary | ICD-10-CM

## 2023-03-12 DIAGNOSIS — I700 Atherosclerosis of aorta: Secondary | ICD-10-CM

## 2023-03-12 DIAGNOSIS — I2510 Atherosclerotic heart disease of native coronary artery without angina pectoris: Secondary | ICD-10-CM

## 2023-03-12 DIAGNOSIS — J439 Emphysema, unspecified: Secondary | ICD-10-CM

## 2023-03-12 LAB — LIPOPROTEIN A (LPA): Lipoprotein (a): 51.2 nmol/L — ABNORMAL HIGH (ref <75.0)

## 2023-03-12 NOTE — Transitions of Care (Post Inpatient/ED Visit) (Signed)
03/12/2023  Name: Brittany Duffy MRN: [id number] DOB: 05-Mar-1956  Today's TOC FU Call Status: Today's TOC FU Call Status:: Successful TOC FU Call Competed TOC FU Call Complete Date: 03/12/23  Transition Care Management Follow-up Telephone Call Date of Discharge: 03/11/23 Discharge Facility: [facility]) Type of Discharge: Inpatient Admission Primary Inpatient Discharge Diagnosis:: STEMI How have you been since you were released from the hospital?: Better ("I am doing fine; just trying to take it easy until I fully recuperate, no problems so far-- I am really pretty much back to my normal self") Any questions or concerns?: No  Items Reviewed: Did you receive and understand the discharge instructions provided?: Yes (thoroughly reviewed with patient who verbalizes good understanding of same) Medications obtained,verified, and reconciled?: Yes (Medications Reviewed) (Full medication reconciliation/ review completed; no concerns or discrepancies identified; confirmed patient obtained/ is taking all newly Rx'd medications as instructed; self-manages medications and denies questions/ concerns around medications today) Any new allergies since your discharge?: No Dietary orders reviewed?: Yes Type of Diet Ordered:: Heart Healthy/ Low salt Do you have support at home?: Yes People in Home: other relative(s), sibling(s) Name of Support/Comfort Primary Source: Reports independent in self-care activities; supportive family members assists as/ if needed/ indicated  Medications Reviewed Today: Medications Reviewed Today     Reviewed by Michaela Corner, RN (Registered Nurse) on 03/12/23 at 1430  Med List Status: <None>   Medication Order Taking? Sig Documenting Provider Last Dose Status Informant  ALPRAZolam (XANAX) 0.5 MG tablet [id number]  Take 0.5 mg by mouth.  [provider]  Active            Med Note Rica Mote   Tue Sep 15, 2016 11:15 AM) Received from: External  Pharmacy  Ascorbic Acid (VITAMIN C PO) [id number]  Take 1 tablet by mouth daily.  [provider]  Active   aspirin EC 81 MG tablet [id number]  Take 1 tablet (81 mg total) by mouth daily. Swallow whole. Lyn Records, MD  Active   CALCIUM PO [id number]  Take 1 tablet by mouth daily.  [provider]  Active   cetirizine (ZYRTEC) 10 MG tablet [id number]  Take 10 mg by mouth 2 (two) times daily. [provider]  Active   Cholecalciferol (VITAMIN D PO) [id number]  Take by mouth. [provider]  Active   Cyanocobalamin (VITAMIN B 12 PO) [id number]  Take by mouth. [provider]  Active   fish oil-omega-3 fatty acids 1000 MG capsule [id number]  Take 2 g by mouth daily.   [provider]  Active   furosemide (LASIX) 20 MG tablet [id number]  Take 1 tablet (20 mg total) by mouth 2 (two) times daily. Perlie Gold, PA-C  Active   GARLIC PO [id number]  Take by mouth. [provider]  Active   losartan (COZAAR) 25 MG tablet [id number]  Take 1 tablet (25 mg total) by mouth daily. [name], NP  Active   MILK THISTLE PO [id number]  Take by mouth. [provider]  Active   Multiple Vitamin (MULTIVITAMIN PO) [id number]  Take 1 tablet by mouth daily. Reported on 04/28/2016 [provider]  Active   potassium chloride SA (KLOR-CON M) 20 MEQ tablet [id number]  Take 1 tablet (20 mEq total) by mouth daily. Perlie Gold, PA-C  Active   rosuvastatin (CRESTOR) 40 MG tablet [id number]  Take 1 tablet (40 mg total) by mouth daily. Perlie Gold, PA-C  Active   ticagrelor Marden Noble)  90 MG TABS tablet [id number]  Take 1 tablet (90 mg total) by mouth 2 (two) times daily. Perlie Gold, PA-C  Active   VITAMIN A PO [id number]  Take by mouth. [provider]  Active   VITAMIN E PO [id number]  Take 1 tablet by mouth daily.  [provider]  Active             Home Care and Equipment/Supplies: Were Home Health Services Ordered?:  No Any new equipment or medical supplies ordered?: No  Functional Questionnaire: Do you need assistance with bathing/showering or dressing?: No Do you need assistance with meal preparation?: No Do you need assistance with eating?: No Do you have difficulty maintaining continence: No Do you need assistance with getting out of bed/getting out of a chair/moving?: No Do you have difficulty managing or taking your medications?: No  Follow up appointments reviewed: PCP Follow-up appointment confirmed?: NA (verified not indicated per hospital discharging provider discharge notes) Specialist Hospital Follow-up appointment confirmed?: Yes Date of Specialist follow-up appointment?: 03/24/23 Follow-Up Specialty Provider:: cardiology provider Do you need transportation to your follow-up appointment?: No Do you understand care options if your condition(s) worsen?: Yes-patient verbalized understanding  SDOH Interventions Today    Flowsheet Row Most Recent Value  SDOH Interventions   Food Insecurity Interventions Intervention Not Indicated  Transportation Interventions Intervention Not Indicated  [drives self,  family assists as needed]      TOC Interventions Today    Flowsheet Row Most Recent Value  TOC Interventions   TOC Interventions Discussed/Reviewed TOC Interventions Discussed  [Patient declines need for ongoing/ further care coordination outreach,  no care coordination needs identified at time of TOC call today,  provided my direct contact information should questions/ concerns/ needs arise post-TOC call]      Interventions Today    Flowsheet Row Most Recent Value  Chronic Disease   Chronic disease during today's visit Other  [STEMI with PCI to RCA]  General Interventions   General Interventions Discussed/Reviewed General Interventions Discussed, Doctor Visits  Doctor Visits Discussed/Reviewed Specialist, Doctor Visits Discussed  PCP/Specialist Visits Compliance with follow-up  visit  Exercise Interventions   Exercise Discussed/Reviewed Exercise Discussed  [cardiac rehab program]  Education Interventions   Education Provided Provided Education  Provided Verbal Education On Medication, When to see the doctor, Other  [purpose of newly Rx'd medications,  need to clarify whether she needs to take all of the vitamins that she has on her medication list]  Nutrition Interventions   Nutrition Discussed/Reviewed Nutrition Discussed, Decreasing salt  [Heart Healthy]  Pharmacy Interventions   Pharmacy Dicussed/Reviewed Pharmacy Topics Discussed      Caryl Pina, RN, BSN, CCRN Alumnus RN CM Care Coordination/ Transition of Care- [facility] Care Management [phone]: direct office

## 2023-03-15 NOTE — Telephone Encounter (Signed)
**Note De-Identified Brittany Duffy Obfuscation** Transition Care Management Unsuccessful Follow-up Telephone Call  Date of discharge and from where:  03/11/2023 from [facility]  Attempts:  1st Attempt  Reason for unsuccessful TCM follow-up call:  Left voice message asking the pt to call Larita Fife back at [facility] at [phone].

## 2023-03-16 NOTE — Telephone Encounter (Signed)
**Note De-Identified Brittany Duffy Obfuscation** Patient contacted regarding discharge from [facility] on 03/11/2023.  Patient understands to follow up with provider Tereso Newcomer, PA-c on 03/24/2023 at 10:05 at [address], [address] in [place], [place] [address]. Patient understands discharge instructions? Yes Patient understands medications and regiment? Yes  Patient understands to bring all medications to this visit? Yes  Ask patient:  Are you enrolled in My Chart: Yes  The pt is without any complaints at this time. She denies having any CP/discomfort, nausea, vomiting, diaphoresis, dizziness, or lightheadedness.  She reports that her cath site looks good and is without any redness, streaking, swelling, fever, or drainage. She does have [facility] phone number and is aware to callus if she has any questions or concerns.

## 2023-03-23 NOTE — Progress Notes (Unsigned)
Cardiology Office Note:    Date:  03/24/2023  ID:  Brittany Duffy, DOB 04/18/1956, MRN [id number] PCP: [name], NP  [facility] Providers Cardiologist:  Verne Carrow, MD          Patient Profile:   Coronary artery disease Inf STEMI 02/2023 s/p 3 x 32 mm and 3 x 38 mm DES to [facility] LHC 03/09/23: LAD mid 30, dist 30; LCx mid 30; RCA prox thrombotic 100, mid 90 TTE 03/10/23: EF 65-70, no RWMA, mild LVH, Gr 1 DD, NL RVSF, mod elevated PASP, RVSP 47.3, trivial MR, AV sclerosis  (HFpEF) heart failure with preserved ejection fraction  Pulmonary hypertension  Hypertension  Pre-diabetes  Hyperlipidemia   Tobacco use        History of Present Illness:   Brittany Duffy is a 67 y.o. female who returns for transitional care, post hospital f/u. She was last seen by Dr. Katrinka Blazing in 08/2022. She was admitted 4/30-5/2 with an inferior STEMI. Cardiac catheterization demonstrated thrombotic occlusion of the mRCA tx with DES x 2. EF was normal on echocardiogram. He was not started on beta-blocker Rx due to preserved EF (REDUCE-AMI Trial). Her LVEDP was elevated at cath. She was given IV Lasix. She had mod elevated PASP. She was discharged on oral Lasix. She is here alone. Since discharge, she has felt good. She does note some chest pain from time to time. She also has neck pain. She sees a neurosurgeon for her neck problems. She feels short of breath at times as well. She has not had orthopnea, leg edema, syncope. She has neck pain and chest pain like indigestion as her anginal equivalent. She has not had a recurrence of these symptoms. She has quit smoking since discharge.   Review of Systems  Hematologic/Lymphatic: Bruises/bleeds easily.  Gastrointestinal:  Negative for hematochezia and melena.  Genitourinary:  Negative for hematuria.   See HPI    Studies Reviewed:    EKG:  NSR, HR 72, normal axis, TW inversions in 2,3, aVF, V5-6, QTc 433 ms  Risk  Assessment/Calculations:             Physical Exam:   VS:  BP 130/60   Pulse 72   Ht 5' (1.524 m)   Wt 143 lb 9.6 oz (65.1 kg)   LMP 01/23/2012   SpO2 95%   BMI 28.04 kg/m    Wt Readings from Last 3 Encounters:  03/24/23 143 lb 9.6 oz (65.1 kg)  03/10/23 142 lb 12.8 oz (64.8 kg)  02/19/23 145 lb 12.8 oz (66.1 kg)    Constitutional:      Appearance: Healthy appearance. Not in distress.  Neck:     Vascular: JVD normal.  Pulmonary:     Breath sounds: Normal breath sounds. No wheezing. No rales.  Cardiovascular:     Normal rate. Regular rhythm.     Murmurs: There is no murmur.     Comments: R wrist w/o hematoma  Edema:    Peripheral edema absent.  Abdominal:     Palpations: Abdomen is soft.       ASSESSMENT AND PLAN:   Acute ST elevation myocardial infarction (STEMI) of inferior wall (HCC) Status post recent inferior STEMI treated with DES x 2 to the RCA.  She had mild nonobstructive disease in the LAD and LCx.  EF is preserved on echocardiogram.  She was not placed on beta-blocker therapy due to preserved EF.  Since discharge, she has had some chest discomfort that  sounds noncardiac.  She has not had a recurrence of her angina.  She has noted some shortness of breath at times it seems to be related to side effects from ticagrelor.  I have reassured her that the symptoms should resolve over time.  I have encouraged her to pursue cardiac rehabilitation. Continue ASA 81 mg daily, ticagrelor 90 mg twice daily, Crestor 40 mg daily. If shortness of breath continues, consider transitioning ticagrelor to clopidogrel Follow-up with me in 3 months, Dr. Clifton James in 6 months  Hyperlipidemia LP(a) in the hospital was at goal at 51.2.  Continue Crestor 40 mg daily.  Arrange fasting CMET, lipids in 4 weeks.  Cigarette smoker one half pack a day or less She has quit smoking.  I congratulated her on this.  (HFpEF) heart failure with preserved ejection fraction (HCC) She had elevated  LVEDP during her cardiac catheterization as well as pulmonary hypertension on her echocardiogram.  She was diuresed and kept on Lasix 20 mg twice daily.  Obtain follow-up BMET today.  Continue Lasix 20 mg twice daily.  Other secondary pulmonary hypertension (HCC) Pulmonary hypertension was likely related to acute CHF.  Consider arranging repeat echocardiogram at next visit to ensure pulmonary pressures are improved.    Cardiac Rehabilitation Eligibility Assessment  The patient is ready to start cardiac rehabilitation from a cardiac standpoint.       Dispo:  Return in about 3 months (around 06/24/2023) for Routine Follow Up, w/ Tereso Newcomer, PA-C.  Signed, Tereso Newcomer, PA-C

## 2023-03-24 ENCOUNTER — Ambulatory Visit: Payer: Medicare Other | Attending: Physician Assistant | Admitting: Physician Assistant

## 2023-03-24 ENCOUNTER — Encounter: Payer: Self-pay | Admitting: Physician Assistant

## 2023-03-24 VITALS — BP 130/60 | HR 72 | Ht 60.0 in | Wt 143.6 lb

## 2023-03-24 DIAGNOSIS — I2119 ST elevation (STEMI) myocardial infarction involving other coronary artery of inferior wall: Secondary | ICD-10-CM | POA: Diagnosis not present

## 2023-03-24 DIAGNOSIS — I2729 Other secondary pulmonary hypertension: Secondary | ICD-10-CM

## 2023-03-24 DIAGNOSIS — I5032 Chronic diastolic (congestive) heart failure: Secondary | ICD-10-CM

## 2023-03-24 DIAGNOSIS — F17210 Nicotine dependence, cigarettes, uncomplicated: Secondary | ICD-10-CM | POA: Diagnosis not present

## 2023-03-24 DIAGNOSIS — E7800 Pure hypercholesterolemia, unspecified: Secondary | ICD-10-CM

## 2023-03-24 DIAGNOSIS — I5030 Unspecified diastolic (congestive) heart failure: Secondary | ICD-10-CM | POA: Insufficient documentation

## 2023-03-24 LAB — BASIC METABOLIC PANEL
BUN/Creatinine Ratio: 12 (ref 12–28)
BUN: 10 mg/dL (ref 8–27)
CO2: 24 mmol/L (ref 20–29)
Calcium: 9.6 mg/dL (ref 8.7–10.3)
Chloride: 106 mmol/L (ref 96–106)
Creatinine, Ser: 0.85 mg/dL (ref 0.57–1.00)
Glucose: 97 mg/dL (ref 70–99)
Potassium: 4.4 mmol/L (ref 3.5–5.2)
Sodium: 144 mmol/L (ref 134–144)
eGFR: 75 mL/min/{1.73_m2} (ref >59)

## 2023-03-24 MED ORDER — NITROGLYCERIN 0.4 MG SL SUBL: 0.4000 mg | SUBLINGUAL_TABLET | SUBLINGUAL | 3 refills | Status: AC | PRN

## 2023-03-24 MED ORDER — LOSARTAN POTASSIUM 50 MG PO TABS: 50.0000 mg | ORAL_TABLET | Freq: Every day | ORAL | 3 refills | Status: DC

## 2023-03-24 NOTE — Assessment & Plan Note (Signed)
She has quit smoking.  I congratulated her on this.

## 2023-03-24 NOTE — Patient Instructions (Addendum)
Medication Instructions:  Your physician has recommended you make the following change in your medication:   INCREASE Losartan to 50 mg taking 1 daily  START Nitroglyverin 0.4 s/l tablet taking only as needed ... The proper use and anticipated side effects of nitroglycerine has been carefully explained.  If a single episode of chest pain is not relieved by one tablet, the patient will try another within 5 minutes; and if this doesn't relieve the pain, the patient is instructed to call 911 for transportation to an emergency department.   *If you need a refill on your cardiac medications before your next appointment, please call your pharmacy*   Lab Work: TODAY:  BMET   04/23/23:  COME BACK TO THE OFFICE, FASTING: CMET & LIPID   If you have labs (blood work) drawn today and your tests are completely normal, you will receive your results only by: MyChart Message (if you have MyChart) OR A paper copy in the mail If you have any lab test that is abnormal or we need to change your treatment, we will call you to review the results.   Testing/Procedures: None ordered   Follow-Up: At [facility], you and your health needs are our priority.  As part of our continuing mission to provide you with exceptional heart care, we have created designated Provider Care Teams.  These Care Teams include your primary Cardiologist (physician) and Advanced Practice Providers (APPs -  Physician Assistants and Nurse Practitioners) who all work together to provide you with the care you need, when you need it.  We recommend signing up for the patient portal called "MyChart".  Sign up information is provided on this After Visit Summary.  MyChart is used to connect with patients for Virtual Visits (Telemedicine).  Patients are able to view lab/test results, encounter notes, upcoming appointments, etc.  Non-urgent messages can be sent to your provider as well.   To learn more about what you can do with MyChart,  go to [email or web address].    Your next appointment:   3 month(s)  Provider:   Tereso Newcomer, PA-C         Other Instructions Your physician has requested that you regularly monitor and record your blood pressure readings at home. Please use the same machine at the same time of day to check your readings and record them to bring to your follow-up visit.   Please monitor blood pressures and keep a log of your readings for 2 weeks and send them in.    Make sure to check 2 hours after your medications.    AVOID these things for 30 minutes before checking your blood pressure: No Drinking caffeine. No Drinking alcohol. No Eating. No Smoking. No Exercising.   Five minutes before checking your blood pressure: Pee. Sit in a dining chair. Avoid sitting in a soft couch or armchair. Be quiet. Do not talk   Please weigh yourself daily and call if your weight is up 3 lbs in 24 hours or 5 lbs in a week.

## 2023-03-24 NOTE — Assessment & Plan Note (Addendum)
She had elevated LVEDP during her cardiac catheterization as well as pulmonary hypertension on her echocardiogram.  She was diuresed and kept on Lasix 20 mg twice daily.  Obtain follow-up BMET today.  Continue Lasix 20 mg twice daily.

## 2023-03-24 NOTE — Assessment & Plan Note (Signed)
LP(a) in the hospital was at goal at 51.2.  Continue Crestor 40 mg daily.  Arrange fasting CMET, lipids in 4 weeks.

## 2023-03-24 NOTE — Assessment & Plan Note (Signed)
Status post recent inferior STEMI treated with DES x 2 to the RCA.  She had mild nonobstructive disease in the LAD and LCx.  EF is preserved on echocardiogram.  She was not placed on beta-blocker therapy due to preserved EF.  Since discharge, she has had some chest discomfort that sounds noncardiac.  She has not had a recurrence of her angina.  She has noted some shortness of breath at times it seems to be related to side effects from ticagrelor.  I have reassured her that the symptoms should resolve over time.  I have encouraged her to pursue cardiac rehabilitation. Continue ASA 81 mg daily, ticagrelor 90 mg twice daily, Crestor 40 mg daily. If shortness of breath continues, consider transitioning ticagrelor to clopidogrel Follow-up with me in 3 months, Dr. Clifton James in 6 months

## 2023-03-24 NOTE — Assessment & Plan Note (Signed)
Pulmonary hypertension was likely related to acute CHF.  Consider arranging repeat echocardiogram at next visit to ensure pulmonary pressures are improved.

## 2023-03-31 ENCOUNTER — Telehealth (HOSPITAL_COMMUNITY): Payer: Self-pay

## 2023-03-31 NOTE — Telephone Encounter (Signed)
Pt is not interested at the moment in cardiac rehab. Closed referral.

## 2023-03-31 NOTE — Progress Notes (Signed)
Pt has been made aware of normal result and verbalized understanding.  jw

## 2023-04-12 ENCOUNTER — Ambulatory Visit: Payer: Medicare Other

## 2023-04-16 ENCOUNTER — Other Ambulatory Visit (HOSPITAL_COMMUNITY): Payer: Self-pay

## 2023-04-23 ENCOUNTER — Ambulatory Visit: Payer: Medicare Other | Attending: Physician Assistant

## 2023-04-23 DIAGNOSIS — I2119 ST elevation (STEMI) myocardial infarction involving other coronary artery of inferior wall: Secondary | ICD-10-CM

## 2023-04-24 LAB — COMPREHENSIVE METABOLIC PANEL
ALT: 10 IU/L (ref 0–32)
AST: 17 IU/L (ref 0–40)
Albumin: 4.9 g/dL (ref 3.9–4.9)
Alkaline Phosphatase: 75 IU/L (ref 44–121)
BUN/Creatinine Ratio: 10 — ABNORMAL LOW (ref 12–28)
BUN: 9 mg/dL (ref 8–27)
Bilirubin Total: 0.3 mg/dL (ref 0.0–1.2)
CO2: 25 mmol/L (ref 20–29)
Calcium: 9.7 mg/dL (ref 8.7–10.3)
Chloride: 102 mmol/L (ref 96–106)
Creatinine, Ser: 0.88 mg/dL (ref 0.57–1.00)
Globulin, Total: 2.5 g/dL (ref 1.5–4.5)
Glucose: 88 mg/dL (ref 70–99)
Potassium: 3.9 mmol/L (ref 3.5–5.2)
Sodium: 142 mmol/L (ref 134–144)
Total Protein: 7.4 g/dL (ref 6.0–8.5)
eGFR: 72 mL/min/{1.73_m2} (ref >59)

## 2023-04-24 LAB — LIPID PANEL
Chol/HDL Ratio: 1.9 ratio (ref 0.0–4.4)
Cholesterol, Total: 126 mg/dL (ref 100–199)
HDL: 67 mg/dL (ref >39)
LDL Chol Calc (NIH): 43 mg/dL (ref 0–99)
Triglycerides: 82 mg/dL (ref 0–149)
VLDL Cholesterol Cal: 16 mg/dL (ref 5–40)

## 2023-04-30 ENCOUNTER — Other Ambulatory Visit (HOSPITAL_COMMUNITY): Payer: Self-pay

## 2023-05-04 NOTE — Progress Notes (Signed)
Pt has been made aware of normal result and verbalized understanding.  jw

## 2023-05-10 ENCOUNTER — Ambulatory Visit
Admission: RE | Admit: 2023-05-10 | Discharge: 2023-05-10 | Disposition: A | Discharge status: Home / Self Care | Payer: Medicare Other | Source: Ambulatory Visit | Attending: Family | Admitting: Family

## 2023-05-10 ENCOUNTER — Ambulatory Visit: Payer: Medicare Other

## 2023-05-10 DIAGNOSIS — Z1231 Encounter for screening mammogram for malignant neoplasm of breast: Secondary | ICD-10-CM

## 2023-05-19 ENCOUNTER — Encounter: Payer: Self-pay | Admitting: Emergency Medicine

## 2023-05-19 ENCOUNTER — Ambulatory Visit (INDEPENDENT_AMBULATORY_CARE_PROVIDER_SITE_OTHER): Payer: Medicare Other | Admitting: Emergency Medicine

## 2023-05-19 VITALS — BP 164/82 | HR 86 | Temp 98.9°F | Ht 60.0 in | Wt 148.2 lb

## 2023-05-19 DIAGNOSIS — F17200 Nicotine dependence, unspecified, uncomplicated: Secondary | ICD-10-CM | POA: Diagnosis not present

## 2023-05-19 DIAGNOSIS — J449 Chronic obstructive pulmonary disease, unspecified: Secondary | ICD-10-CM | POA: Diagnosis not present

## 2023-05-19 NOTE — Assessment & Plan Note (Signed)
She quit smoking 2 months ago, supported her in this and explained its importance.  She participates in lung cancer screening program, had a stable CT chest 02/2023.  She needs a repeat screening scan and 02/2024.  We will arrange for this

## 2023-05-19 NOTE — Progress Notes (Signed)
Subjective:    Patient ID: Brittany Duffy, female    DOB: 08-Feb-1956, 67 y.o.   MRN: [id number]  HPI  67 year old former smoker (45 pack years) with a history of cervical dysplasia, hep C, hypertension, CAD with STEMI in May, required PTCI.  She is followed by Dr. Zonia Kief and participates in lung cancer screening program.  She is referred today for abnormal CT chest and her hx tobacco use. She notes some fatigue when exerting herself. Occasionally has to stop to rest w heavy exertion. No wheeze. She does occasionally have sinus congestion and drainage. No real cough since she quit smoking 2 months ago.   Lung cancer screening CT chest 03/08/2023 reviewed by me, shows no mediastinal or hilar adenopathy.  There is moderate centrilobular and paraseptal emphysema.  There is a stable 5 mm subpleural right lower lobe pulmonary nodule that is unchanged compared with her previous screening CT 01/12/2022.   Review of Systems As per HPI  Past Medical History:  Diagnosis Date   Bronchitis    Dysplasia of cervix, low grade (CIN 1)    S/P CRYO   Fibroid    Hepatitis C    Hypertension    Osteopenia 08/2018   T score -1.9 FRAX 4.7% / 1%   Vaginal intraepithelial neoplasia III (VAIN III)      Family History  Problem Relation Age of Onset   Diabetes Mother    Hypertension Mother    Breast cancer Mother        Age Late 25's   Hypertension Maternal Grandfather      Social History   Socioeconomic History   Marital status: Single    Spouse name: Not on file   Number of children: Not on file   Years of education: Not on file   Highest education level: Not on file  Occupational History   Not on file  Tobacco Use   Smoking status: Every Day    Packs/day: 1    Types: Cigarettes    Passive exposure: Current   Smokeless tobacco: Current   Tobacco comments:    Pt has not smoked since end of May 2024. ARJ 05/19/23  Vaping Use   Vaping Use: Never used  Substance and Sexual Activity    Alcohol use: Yes    Comment: occassional   Drug use: No   Sexual activity: Yes    Partners: Male    Birth control/protection: Surgical    Comment: Hyst., INTERCOURSE AGE 39, SEXUAL PARTNERS MORE THAN 5  Other Topics Concern   Not on file  Social History Narrative   Not on file   Social Determinants of Health   Financial Resource Strain: Not on file  Food Insecurity: No Food Insecurity (03/12/2023)   Hunger Vital Sign    Worried About Running Out of Food in the Last Year: Never true    Ran Out of Food in the Last Year: Never true  Transportation Needs: No Transportation Needs (03/12/2023)   PRAPARE - Administrator, Civil Service (Medical): No    Lack of Transportation (Non-Medical): No  Physical Activity: Not on file  Stress: Not on file  Social Connections: Not on file  Intimate Partner Violence: Not on file     No Known Allergies   Outpatient Medications Prior to Visit  Medication Sig Dispense Refill   ALPRAZolam (XANAX) 0.5 MG tablet Take 0.5 mg by mouth.   1   Ascorbic Acid (VITAMIN C PO) Take 1 tablet  by mouth daily.      aspirin EC 81 MG tablet Take 1 tablet (81 mg total) by mouth daily. Swallow whole.     CALCIUM PO Take 1 tablet by mouth daily.      cetirizine (ZYRTEC) 10 MG tablet Take 10 mg by mouth 2 (two) times daily.     Cholecalciferol (VITAMIN D PO) Take by mouth.     Cyanocobalamin (VITAMIN B 12 PO) Take 1 tablet by mouth every other day.     fish oil-omega-3 fatty acids 1000 MG capsule Take 2 g by mouth daily.       furosemide (LASIX) 20 MG tablet Take 1 tablet (20 mg total) by mouth 2 (two) times daily. 60 tablet 3   losartan (COZAAR) 50 MG tablet Take 1 tablet (50 mg total) by mouth daily. 90 tablet 3   Multiple Vitamin (MULTIVITAMIN PO) Take 1 tablet by mouth daily. Reported on 04/28/2016     nitroGLYCERIN (NITROSTAT) 0.4 MG SL tablet Place 1 tablet (0.4 mg total) under the tongue every 5 (five) minutes as needed for chest pain. 25 tablet 3    potassium chloride SA (KLOR-CON M) 20 MEQ tablet Take 1 tablet (20 mEq total) by mouth daily. 30 tablet 3   rosuvastatin (CRESTOR) 40 MG tablet Take 1 tablet (40 mg total) by mouth daily. 30 tablet 3   ticagrelor (BRILINTA) 90 MG TABS tablet Take 1 tablet (90 mg total) by mouth 2 (two) times daily. 60 tablet 3   VITAMIN A PO Take by mouth.     VITAMIN E PO Take 1 tablet by mouth daily.      No facility-administered medications prior to visit.         Objective:   Physical Exam Vitals:   05/19/23 1556  BP: (!) 164/82  Pulse: 86  Temp: 98.9 F (37.2 C)  TempSrc: Oral  SpO2: 99%  Weight: 148 lb 3.2 oz (67.2 kg)  Height: 5' (1.524 m)   Gen: Pleasant, well-nourished, in no distress,  normal affect  ENT: No lesions,  mouth clear,  oropharynx clear, no postnasal drip  Neck: No JVD, no stridor  Lungs: No use of accessory muscles, no crackles or wheezing on normal respiration, no wheeze on forced expiration  Cardiovascular: RRR, heart sounds normal, no murmur or gallops, no peripheral edema  Musculoskeletal: No deformities, no cyanosis or clubbing  Neuro: alert, awake, non focal  Skin: Warm, no lesions or rash      Assessment & Plan:  Episodic tobacco dependence She quit smoking 2 months ago, supported her in this and explained its importance.  She participates in lung cancer screening program, had a stable CT chest 02/2023.  She needs a repeat screening scan and 02/2024.  We will arrange for this  COPD (chronic obstructive pulmonary disease) (HCC) She is quite active, helps take care of her dependent brother without significant shortness of breath.  She does have emphysema changes on her chest imaging.  Suspect that she does have at least mild COPD.  Discussed this with her today and have recommended pulmonary function testing to quantify her degree of obstruction.  We can hold off on BD therapy for now.  We will review her PFT when available   Levy Pupa, MD,  PhD 05/19/2023, 4:36 PM [facility] [phone] or if no answer before 7:00PM call [phone] For any issues after 7:00PM please call eLink [phone]

## 2023-05-19 NOTE — Assessment & Plan Note (Signed)
She is quite active, helps take care of her dependent brother without significant shortness of breath.  She does have emphysema changes on her chest imaging.  Suspect that she does have at least mild COPD.  Discussed this with her today and have recommended pulmonary function testing to quantify her degree of obstruction.  We can hold off on BD therapy for now.  We will review her PFT when available

## 2023-05-19 NOTE — Patient Instructions (Signed)
Congratulations on stopping smoking.  It is very important for you to avoid restarting.  This is the most important thing you can do to help your heart and your lungs. We will arrange for pulmonary function testing You will need to have a repeat lung cancer screening CT scan of the chest in April 2025 Follow Dr. Delton Coombes in 2 to 3 months and we will review your pulmonary function testing at that time

## 2023-05-25 ENCOUNTER — Other Ambulatory Visit (HOSPITAL_COMMUNITY): Payer: Self-pay

## 2023-06-02 ENCOUNTER — Other Ambulatory Visit: Payer: Self-pay | Admitting: Family

## 2023-06-02 DIAGNOSIS — I10 Essential (primary) hypertension: Secondary | ICD-10-CM

## 2023-06-02 DIAGNOSIS — J3089 Other allergic rhinitis: Secondary | ICD-10-CM

## 2023-06-21 ENCOUNTER — Other Ambulatory Visit (HOSPITAL_COMMUNITY): Payer: Self-pay

## 2023-06-21 ENCOUNTER — Ambulatory Visit: Payer: Medicare Other | Attending: Physician Assistant | Admitting: Physician Assistant

## 2023-06-21 ENCOUNTER — Ambulatory Visit: Payer: Medicare Other | Admitting: Physician Assistant

## 2023-06-21 ENCOUNTER — Encounter: Payer: Self-pay | Admitting: Physician Assistant

## 2023-06-21 VITALS — BP 170/86 | HR 100 | Ht 60.0 in | Wt 149.0 lb

## 2023-06-21 DIAGNOSIS — I2510 Atherosclerotic heart disease of native coronary artery without angina pectoris: Secondary | ICD-10-CM

## 2023-06-21 DIAGNOSIS — E7800 Pure hypercholesterolemia, unspecified: Secondary | ICD-10-CM | POA: Diagnosis not present

## 2023-06-21 DIAGNOSIS — I10 Essential (primary) hypertension: Secondary | ICD-10-CM

## 2023-06-21 DIAGNOSIS — I5032 Chronic diastolic (congestive) heart failure: Secondary | ICD-10-CM

## 2023-06-21 DIAGNOSIS — I2729 Other secondary pulmonary hypertension: Secondary | ICD-10-CM | POA: Diagnosis not present

## 2023-06-21 HISTORY — DX: Atherosclerotic heart disease of native coronary artery without angina pectoris: I25.10

## 2023-06-21 MED ORDER — AMLODIPINE BESYLATE 5 MG PO TABS: 5.0000 mg | ORAL_TABLET | Freq: Every day | ORAL | 3 refills | Status: DC

## 2023-06-21 NOTE — Assessment & Plan Note (Signed)
Blood pressure uncontrolled.  She was previously on amlodipine. -Add Amlodipine 5mg  daily. -Continue losartan 50 mg daily -Monitor blood pressure and notify for BP >140/90

## 2023-06-21 NOTE — Progress Notes (Signed)
Cardiology Office Note:    Date:  06/21/2023  ID:  Brittany Duffy, DOB Feb 25, 1956, MRN [id number] PCP: Brittany Fendt, NP  [facility] Providers Cardiologist:  Verne Carrow, MD       Patient Profile:      Coronary artery disease Inf STEMI 02/2023 s/p 3 x 32 mm and 3 x 38 mm DES to [facility] 03/09/23: LAD mid 30, dist 30; LCx mid 30; RCA prox thrombotic 100, mid 90 TTE 03/10/23: EF 65-70, no RWMA, mild LVH, Gr 1 DD, NL RVSF, mod elevated PASP, RVSP 47.3, trivial MR, AV sclerosis  (HFpEF) heart failure with preserved ejection fraction  Pulmonary hypertension  Hypertension  Pre-diabetes  Hyperlipidemia   Tobacco use              Discussed the use of AI scribe software for clinical note transcription with the patient, who gave verbal consent to proceed. History of Present Illness   A 67 year old patient who returns for follow-up of CAD and CHF.  The patient denies any chest discomfort since her last visit but reports occasional shortness of breath, especially in hot weather.  She has not had orthopnea, leg edema, syncope.     ROS:  See the HPI No melena or hematochezia She admits to increased stress    Studies Reviewed:        Risk Assessment/Calculations:     HYPERTENSION CONTROL Vitals:   06/21/23 1410 06/21/23 1443  BP: (!) 172/72 (!) 170/86    The patient's blood pressure is elevated above target today.  In order to address the patient's elevated BP: A new medication was prescribed today.          Physical Exam:   VS:  BP (!) 170/86   Pulse 100   Ht 5' (1.524 m)   Wt 149 lb (67.6 kg)   LMP 01/23/2012   SpO2 97%   BMI 29.10 kg/m    Wt Readings from Last 3 Encounters:  06/21/23 149 lb (67.6 kg)  05/19/23 148 lb 3.2 oz (67.2 kg)  03/24/23 143 lb 9.6 oz (65.1 kg)    Constitutional:      Appearance: Healthy appearance. Not in distress.  Neck:     Vascular: JVD normal.  Pulmonary:     Breath sounds: Normal breath sounds. No wheezing.  No rales.  Cardiovascular:     Normal rate. Regular rhythm.     Murmurs: There is no murmur.  Edema:    Peripheral edema absent.  Abdominal:     Palpations: Abdomen is soft.  Skin:    General: Skin is warm and dry.        Assessment and Plan:  CAD (coronary artery disease) S/p inferior STEMI in 02/2023 tx with DES x 2 to RCA. EF preserved by Echocardiogram.  She is doing well without chest discomfort to suggest angina. -Continue Crestor 40mg  daily. -Continue Brilinta 90 mg twice daily and Aspirin 81 mg daily  -She plans to try to enroll in cardiac rehab -Keep follow-up with Dr. Clifton James in December as planned  (HFpEF) heart failure with preserved ejection fraction (HCC) She had elevated LVEDP during her cardiac catheterization at the time of her MI as well as pulmonary hypertension on her echocardiogram.  She was diuresed and kept on Lasix 20 mg twice daily.  Overall, volume stable.  NYHA II. -Continue Lasix 20 mg twice daily  Hyperlipidemia Labs from 04/23/23: ALT 10, LDL 43.  LDL optimal.  Continue rosuvastatin 40 mg daily.  Other secondary pulmonary hypertension (HCC) RVSP 47.3 on TTE in 03/2023. Elevated RVSP was likely due to volume overload at the time of her MI. Elevated RVSP may also be due to COPD. -Order limited echocardiogram to reassess PASP.  Essential hypertension Blood pressure uncontrolled.  She was previously on amlodipine. -Add Amlodipine 5mg  daily. -Continue losartan 50 mg daily -Monitor blood pressure and notify for BP >140/90        Dispo:  Return in 16 weeks (on 10/11/2023) for Routine Follow Up with Dr. Clifton James.  Signed, Tereso Newcomer, PA-C

## 2023-06-21 NOTE — Assessment & Plan Note (Addendum)
S/p inferior STEMI in 02/2023 tx with DES x 2 to RCA. EF preserved by Echocardiogram.  She is doing well without chest discomfort to suggest angina. -Continue Crestor 40mg  daily. -Continue Brilinta 90 mg twice daily and Aspirin 81 mg daily  -She plans to try to enroll in cardiac rehab -Keep follow-up with Dr. Clifton James in December as planned

## 2023-06-21 NOTE — Assessment & Plan Note (Addendum)
RVSP 47.3 on TTE in 03/2023. Elevated RVSP was likely due to volume overload at the time of her MI. Elevated RVSP may also be due to COPD. -Order limited echocardiogram to reassess PASP.

## 2023-06-21 NOTE — Patient Instructions (Signed)
Medication Instructions:  Start amlodipine (Norvasc) 5 mg daily *If you need a refill on your cardiac medications before your next appointment, please call your pharmacy*   Lab Work: None ordered If you have labs (blood work) drawn today and your tests are completely normal, you will receive your results only by: MyChart Message (if you have MyChart) OR A paper copy in the mail If you have any lab test that is abnormal or we need to change your treatment, we will call you to review the results.   Testing/Procedures: Your physician has requested that you have a limited echocardiogram. Echocardiography is a painless test that uses sound waves to create images of your heart. It provides your doctor with information about the size and shape of your heart and how well your heart's chambers and valves are working. This procedure takes approximately one hour. There are no restrictions for this procedure. Please do NOT wear cologne, perfume, aftershave, or lotions (deodorant is allowed). Please arrive 15 minutes prior to your appointment time.    Follow-Up: At [facility], you and your health needs are our priority.  As part of our continuing mission to provide you with exceptional heart care, we have created designated Provider Care Teams.  These Care Teams include your primary Cardiologist (physician) and Advanced Practice Providers (APPs -  Physician Assistants and Nurse Practitioners) who all work together to provide you with the care you need, when you need it.   Your next appointment:   10/11/23 at 9:20 AM  Provider:   Verne Carrow, MD     Other Instructions Call if your blood pressure readings at home are greater than 140/90

## 2023-06-21 NOTE — Assessment & Plan Note (Addendum)
Labs from 04/23/23: ALT 10, LDL 43.  LDL optimal.  Continue rosuvastatin 40 mg daily.

## 2023-06-21 NOTE — Assessment & Plan Note (Addendum)
She had elevated LVEDP during her cardiac catheterization at the time of her MI as well as pulmonary hypertension on her echocardiogram.  She was diuresed and kept on Lasix 20 mg twice daily.  Overall, volume stable.  NYHA II. -Continue Lasix 20 mg twice daily

## 2023-06-23 ENCOUNTER — Ambulatory Visit: Payer: Medicare Other | Admitting: Physician Assistant

## 2023-07-07 ENCOUNTER — Ambulatory Visit (HOSPITAL_COMMUNITY): Payer: Medicare Other | Attending: Physician Assistant

## 2023-07-07 DIAGNOSIS — I2729 Other secondary pulmonary hypertension: Secondary | ICD-10-CM

## 2023-07-07 LAB — ECHOCARDIOGRAM LIMITED
Area-P 1/2: 3.43 cm2
S' Lateral: 2.6 cm

## 2023-07-09 ENCOUNTER — Telehealth: Payer: Self-pay | Admitting: Cardiovascular Disease

## 2023-07-09 NOTE — Telephone Encounter (Signed)
Follow Up:     Patient is returning a call from yesterday, concerning her Echo results.

## 2023-07-09 NOTE — Telephone Encounter (Signed)
Left message for patient to callback.  Also left detailed message with normal results at [phone] (OK per DPR).

## 2023-07-13 NOTE — Progress Notes (Signed)
Pt has been made aware of normal result and verbalized understanding.  jw

## 2023-07-26 ENCOUNTER — Other Ambulatory Visit: Payer: Self-pay | Admitting: Physician Assistant

## 2023-07-26 ENCOUNTER — Other Ambulatory Visit (HOSPITAL_COMMUNITY): Payer: Self-pay

## 2023-07-27 ENCOUNTER — Other Ambulatory Visit (HOSPITAL_COMMUNITY): Payer: Self-pay

## 2023-07-27 MED ORDER — FUROSEMIDE 20 MG PO TABS
20.0000 mg | ORAL_TABLET | Freq: Two times a day (BID) | ORAL | 3 refills | Status: DC
  Filled 2023-07-27: qty 180, 90d supply, fill #0
  Filled 2023-10-20 – 2023-11-02 (×2): qty 180, 90d supply, fill #1
  Filled 2024-01-27: qty 180, 90d supply, fill #2
  Filled 2024-04-26: qty 180, 90d supply, fill #3

## 2023-07-27 MED ORDER — TICAGRELOR 90 MG PO TABS
90.0000 mg | ORAL_TABLET | Freq: Two times a day (BID) | ORAL | 3 refills | Status: DC
  Filled 2023-07-27: qty 180, 90d supply, fill #0
  Filled 2024-02-01: qty 180, 90d supply, fill #1

## 2023-07-27 MED ORDER — ROSUVASTATIN CALCIUM 40 MG PO TABS
40.0000 mg | ORAL_TABLET | Freq: Every day | ORAL | 3 refills | Status: DC
  Filled 2023-07-27: qty 90, 90d supply, fill #0
  Filled 2023-10-20 – 2023-11-02 (×2): qty 90, 90d supply, fill #1
  Filled 2024-01-27: qty 90, 90d supply, fill #2
  Filled 2024-04-26: qty 90, 90d supply, fill #3

## 2023-07-27 MED ORDER — POTASSIUM CHLORIDE CRYS ER 20 MEQ PO TBCR
20.0000 meq | EXTENDED_RELEASE_TABLET | Freq: Every day | ORAL | 3 refills | Status: DC
  Filled 2023-07-27: qty 90, 90d supply, fill #0
  Filled 2023-10-20 – 2023-11-02 (×2): qty 90, 90d supply, fill #1
  Filled 2024-01-27: qty 90, 90d supply, fill #2
  Filled 2024-04-26: qty 90, 90d supply, fill #3

## 2023-08-25 ENCOUNTER — Encounter: Payer: Self-pay | Admitting: Family

## 2023-08-25 ENCOUNTER — Ambulatory Visit (INDEPENDENT_AMBULATORY_CARE_PROVIDER_SITE_OTHER): Payer: Medicare Other | Admitting: Family

## 2023-08-25 VITALS — BP 147/82 | HR 89 | Temp 98.8°F | Ht 60.0 in | Wt 152.0 lb

## 2023-08-25 DIAGNOSIS — R7303 Prediabetes: Secondary | ICD-10-CM | POA: Diagnosis not present

## 2023-08-25 DIAGNOSIS — M62838 Other muscle spasm: Secondary | ICD-10-CM

## 2023-08-25 DIAGNOSIS — Z23 Encounter for immunization: Secondary | ICD-10-CM

## 2023-08-25 LAB — POCT GLYCOSYLATED HEMOGLOBIN (HGB A1C): HbA1c, POC (controlled diabetic range): 5.9 % (ref 0.0–7.0)

## 2023-08-25 MED ORDER — CYCLOBENZAPRINE HCL 5 MG PO TABS
5.0000 mg | ORAL_TABLET | Freq: Every day | ORAL | 1 refills | Status: DC
Start: 2023-08-25 — End: 2023-11-24

## 2023-08-25 NOTE — Progress Notes (Signed)
Patient ID: Brittany Duffy, female    DOB: [birth year]  MRN: [id number]  CC: Chronic Conditions Follow-Up  Subjective: Brittany Duffy is a 67 y.o. female who presents for chronic conditions follow-up.   Her concerns today include:  - Prediabetes follow-up. - Bilateral upper back muscle spasms. Requests muscle relaxer. Reports she plans to schedule an appointment with Neurology soon. - Established with Cardiology for chronic conditions management.  Patient Active Problem List   Diagnosis Date Noted   CAD (coronary artery disease) 06/21/2023   Essential hypertension 06/21/2023   COPD (chronic obstructive pulmonary disease) (HCC) 05/19/2023   (HFpEF) heart failure with preserved ejection fraction (HCC) 03/24/2023   Other secondary pulmonary hypertension (HCC) 03/24/2023   Hx of Inf STEMI in 02/2023 03/09/2023   Prediabetes 05/10/2021   Hyperlipidemia 05/10/2021   History of total abdominal hysterectomy 08/27/2020   Episodic tobacco dependence 12/14/2015   Chronic active hepatitis C (HCC) 12/14/2015   Hepatitis, viral 09/25/2015   Allergic urticaria 09/25/2015   Cigarette smoker one half pack a day or less 02/17/2013   Ovarian abscess    Vaginal intraepithelial neoplasia III (VAIN III)    Osteopenia      Current Outpatient Medications on File Prior to Visit  Medication Sig Dispense Refill   ALPRAZolam (XANAX) 0.5 MG tablet Take 0.5 mg by mouth 2 (two) times daily.  1   amLODipine (NORVASC) 5 MG tablet Take 1 tablet (5 mg total) by mouth daily. 90 tablet 3   Ascorbic Acid (VITAMIN C PO) Take 1 tablet by mouth daily.      aspirin EC 81 MG tablet Take 1 tablet (81 mg total) by mouth daily. Swallow whole.     CALCIUM PO Take 1 tablet by mouth daily.      cetirizine (ZYRTEC) 10 MG tablet Take 10 mg by mouth 2 (two) times daily.     Cholecalciferol (VITAMIN D PO) Take 1 tablet by mouth daily.     Cyanocobalamin (VITAMIN B 12 PO) Take 1 tablet by mouth every other day.      fish oil-omega-3 fatty acids 1000 MG capsule Take 2 g by mouth daily.       furosemide (LASIX) 20 MG tablet Take 1 tablet (20 mg total) by mouth 2 (two) times daily. 180 tablet 3   gabapentin (NEURONTIN) 300 MG capsule Take 300 mg by mouth 2 (two) times daily.     losartan (COZAAR) 50 MG tablet Take 1 tablet (50 mg total) by mouth daily. 90 tablet 3   Multiple Vitamin (MULTIVITAMIN PO) Take 1 tablet by mouth daily. Reported on 04/28/2016     nitroGLYCERIN (NITROSTAT) 0.4 MG SL tablet Place 1 tablet (0.4 mg total) under the tongue every 5 (five) minutes as needed for chest pain. 25 tablet 3   potassium chloride SA (KLOR-CON M) 20 MEQ tablet Take 1 tablet (20 mEq total) by mouth daily. 90 tablet 3   rosuvastatin (CRESTOR) 40 MG tablet Take 1 tablet (40 mg total) by mouth daily. 90 tablet 3   ticagrelor (BRILINTA) 90 MG TABS tablet Take 1 tablet (90 mg total) by mouth 2 (two) times daily. 180 tablet 3   VITAMIN A PO Take 1 tablet by mouth every other day. (Occasionally)     VITAMIN E PO Take 1 tablet by mouth every other day.  (Occasionally)     No current facility-administered medications on file prior to visit.    No Known Allergies  Social History   Socioeconomic History  Marital status: Single    Spouse name: Not on file   Number of children: Not on file   Years of education: Not on file   Highest education level: Not on file  Occupational History   Not on file  Tobacco Use   Smoking status: Every Day    Current packs/day: 1.00    Types: Cigarettes    Passive exposure: Current   Smokeless tobacco: Current   Tobacco comments:    Pt has not smoked since end of May 2024. ARJ 05/19/23  Vaping Use   Vaping status: Never Used  Substance and Sexual Activity   Alcohol use: Yes    Comment: occassional   Drug use: No   Sexual activity: Yes    Partners: Male    Birth control/protection: Surgical    Comment: Hyst., INTERCOURSE AGE 43, SEXUAL PARTNERS MORE THAN 5  Other Topics  Concern   Not on file  Social History Narrative   Not on file   Social Determinants of Health   Financial Resource Strain: Not on file  Food Insecurity: No Food Insecurity (03/12/2023)   Hunger Vital Sign    Worried About Running Out of Food in the Last Year: Never true    Ran Out of Food in the Last Year: Never true  Transportation Needs: No Transportation Needs (03/12/2023)   PRAPARE - Administrator, Civil Service (Medical): No    Lack of Transportation (Non-Medical): No  Physical Activity: Not on file  Stress: Not on file  Social Connections: Not on file  Intimate Partner Violence: Not on file    Family History  Problem Relation Age of Onset   Diabetes Mother    Hypertension Mother    Breast cancer Mother        Age Late 73's   Hypertension Maternal Grandfather     Past Surgical History:  Procedure Laterality Date   CARPAL TUNNEL RELEASE     CO2 LSAER OF VAGINA  07, 11   CORONARY/GRAFT ACUTE MI REVASCULARIZATION N/A 03/09/2023   Procedure: Coronary/Graft Acute MI Revascularization;  Surgeon: Kathleene Hazel, MD;  Location: [facility];  Service: Cardiovascular;  Laterality: N/A;   DIVERTICULUM OF THE BLADDER     LEFT HEART CATH AND CORONARY ANGIOGRAPHY N/A 03/09/2023   Procedure: LEFT HEART CATH AND CORONARY ANGIOGRAPHY;  Surgeon: Kathleene Hazel, MD;  Location: [facility];  Service: Cardiovascular;  Laterality: N/A;   OOPHORECTOMY     BSO   TOTAL ABDOMINAL HYSTERECTOMY  2004   BSO    ROS: Review of Systems Negative except as stated above  PHYSICAL EXAM: BP (!) 147/82   Pulse 89   Temp 98.8 F (37.1 C) (Oral)   Ht 5' (1.524 m)   Wt 152 lb (68.9 kg)   LMP 01/23/2012   SpO2 97%   BMI 29.69 kg/m   Physical Exam HENT:     Head: Normocephalic and atraumatic.     Nose: Nose normal.     Mouth/Throat:     Mouth: Mucous membranes are moist.     Pharynx: Oropharynx is clear.  Eyes:     Extraocular Movements:  Extraocular movements intact.     Conjunctiva/sclera: Conjunctivae normal.     Pupils: Pupils are equal, round, and reactive to light.  Cardiovascular:     Rate and Rhythm: Normal rate and regular rhythm.     Pulses: Normal pulses.     Heart sounds: Normal heart sounds.  Pulmonary:     Effort: Pulmonary effort is normal.     Breath sounds: Normal breath sounds.  Musculoskeletal:        General: Normal range of motion.     Right shoulder: Normal.     Left shoulder: Normal.     Right upper arm: Normal.     Left upper arm: Normal.     Right elbow: Normal.     Left elbow: Normal.     Right forearm: Normal.     Left forearm: Normal.     Right wrist: Normal.     Left wrist: Normal.     Right hand: Normal.     Left hand: Normal.     Cervical back: Normal, normal range of motion and neck supple.     Thoracic back: Normal.     Lumbar back: Normal.     Right hip: Normal.     Left hip: Normal.     Right upper leg: Normal.     Left upper leg: Normal.     Right knee: Normal.     Left knee: Normal.     Right lower leg: Normal.     Left lower leg: Normal.     Right ankle: Normal.     Left ankle: Normal.     Right foot: Normal.     Left foot: Normal.  Neurological:     General: No focal deficit present.     Mental Status: She is alert and oriented to person, place, and time.  Psychiatric:        Mood and Affect: Mood normal.        Behavior: Behavior normal.     ASSESSMENT AND PLAN: 1. Prediabetes - Routine screening.  - POCT glycosylated hemoglobin (Hb A1C); Future  2. Muscle spasm - Cyclobenzaprine as prescribed. Counseled on medication adherence/adverse effects.  - Keep all scheduled appointments with Neurology. - Follow-up with primary provider as scheduled.  - cyclobenzaprine (FLEXERIL) 5 MG tablet; Take 1 tablet (5 mg total) by mouth at bedtime.  Dispense: 30 tablet; Refill: 1  3. Encounter for immunization - Administered.  - Flu Vaccine Trivalent High Dose  (Fluad)   Patient was given the opportunity to ask questions.  Patient verbalized understanding of the plan and was able to repeat key elements of the plan. Patient was given clear instructions to go to Emergency Department or return to medical center if symptoms don't improve, worsen, or new problems develop.The patient verbalized understanding.   Orders Placed This Encounter  Procedures   Flu Vaccine Trivalent High Dose (Fluad)   POCT glycosylated hemoglobin (Hb A1C)     Requested Prescriptions   Signed Prescriptions Disp Refills   cyclobenzaprine (FLEXERIL) 5 MG tablet 30 tablet 1    Sig: Take 1 tablet (5 mg total) by mouth at bedtime.    Return in about 3 months (around 11/25/2023) for Follow-Up or next available chronic conditions.  [name], NP

## 2023-08-25 NOTE — Progress Notes (Signed)
Patient states she needs a prescription for muscle relaxers.  Patient wants Flu vaccine.

## 2023-09-01 ENCOUNTER — Ambulatory Visit: Payer: Medicare Other | Admitting: Emergency Medicine

## 2023-09-01 ENCOUNTER — Encounter: Payer: Self-pay | Admitting: Emergency Medicine

## 2023-09-01 ENCOUNTER — Ambulatory Visit (INDEPENDENT_AMBULATORY_CARE_PROVIDER_SITE_OTHER): Payer: Medicare Other | Admitting: Emergency Medicine

## 2023-09-01 VITALS — BP 153/85 | HR 99 | Temp 98.2°F

## 2023-09-01 DIAGNOSIS — F17200 Nicotine dependence, unspecified, uncomplicated: Secondary | ICD-10-CM

## 2023-09-01 DIAGNOSIS — J449 Chronic obstructive pulmonary disease, unspecified: Secondary | ICD-10-CM | POA: Diagnosis not present

## 2023-09-01 MED ORDER — ALBUTEROL SULFATE HFA 108 (90 BASE) MCG/ACT IN AERS: 2.0000 | INHALATION_SPRAY | RESPIRATORY_TRACT | 3 refills | Status: DC | PRN

## 2023-09-01 NOTE — Patient Instructions (Signed)
Full PFT performed today, 

## 2023-09-01 NOTE — Patient Instructions (Signed)
We reviewed your pulmonary function testing today. We will give your prescription for albuterol.  You can use 2 puffs up to every 4 hours if needed for shortness of breath, chest tightness, wheezing. We will hold off on starting a scheduled inhaler medication at this time Congratulations on stopping smoking!!  Do not restart.  You can call us if you are having the urge to do so. Get your repeat lung cancer screening CT chest in April 2025 as planned Follow Dr. Delton Coombes in April after your CT scan so we can review those results together.

## 2023-09-01 NOTE — Assessment & Plan Note (Signed)
Participating in the lung cancer screening program.  Her next scan is in April 2025.

## 2023-09-01 NOTE — Progress Notes (Signed)
Subjective:    Patient ID: Brittany Duffy, female    DOB: 10/11/1956, 67 y.o.   MRN: [id number]  HPI  67 year old former smoker (45 pack years) with a history of cervical dysplasia, hep C, hypertension, CAD with STEMI in May, required PTCI.  She is followed by Dr. Zonia Kief and participates in lung cancer screening program.  She is referred today for abnormal CT chest and her hx tobacco use. She notes some fatigue when exerting herself. Occasionally has to stop to rest w heavy exertion. No wheeze. She does occasionally have sinus congestion and drainage. No real cough since she quit smoking 2 months ago.   Lung cancer screening CT chest 03/08/2023 reviewed by me, shows no mediastinal or hilar adenopathy.  There is moderate centrilobular and paraseptal emphysema.  There is a stable 5 mm subpleural right lower lobe pulmonary nodule that is unchanged compared with her previous screening CT 01/12/2022.   ROV 09/01/2023 --follow-up visit for 67 year old woman with history of tobacco use, CAD, hep C, hypertension.  I saw her in July for evaluation of emphysematous changes seen on her chest imaging.  She had some exertional fatigue but minimal dyspnea.  We arrange for pulmonary function testing.  She also participates in lung cancer screening program and is due for her repeat scan in April 2025. She remains off cigarettes, now for 6 months. Her breathing does not seem to limit her. She can get fatigued. No cough.   Pulmonary function testing performed today and reviewed by me, show spirometry is suggestive of mixed obstruction and restriction with a borderline bronchodilator response.  The FEV1 is 1.38 L or 71% predicted.  Lung volumes are normal.  Diffusion capacity is normal.   Review of Systems As per HPI  Past Medical History:  Diagnosis Date   Bronchitis    CAD (coronary artery disease) 06/21/2023   Inf STEMI 02/2023 s/p 3 x 32 mm and 3 x 38 mm DES to [facility] 03/09/23: LAD mid 30, dist 30;  LCx mid 30; RCA prox thrombotic 100, mid 90 TTE 03/10/23: EF 65-70, no RWMA, mild LVH, Gr 1 DD, NL RVSF, mod elevated PASP, RVSP 47.3, trivial MR, AV sclerosis     Dysplasia of cervix, low grade (CIN 1)    S/P CRYO   Fibroid    Hepatitis C    Hypertension    Osteopenia 08/2018   T score -1.9 FRAX 4.7% / 1%   Vaginal intraepithelial neoplasia III (VAIN III)      Family History  Problem Relation Age of Onset   Diabetes Mother    Hypertension Mother    Breast cancer Mother        Age Late 14's   Hypertension Maternal Grandfather      Social History   Socioeconomic History   Marital status: Single    Spouse name: Not on file   Number of children: Not on file   Years of education: Not on file   Highest education level: Not on file  Occupational History   Not on file  Tobacco Use   Smoking status: Every Day    Current packs/day: 1.00    Types: Cigarettes    Passive exposure: Current   Smokeless tobacco: Current   Tobacco comments:    Pt has not smoked since end of May 2024. ARJ 05/19/23  Vaping Use   Vaping status: Never Used  Substance and Sexual Activity   Alcohol use: Yes    Comment: occassional  Drug use: No   Sexual activity: Yes    Partners: Male    Birth control/protection: Surgical    Comment: Hyst., INTERCOURSE AGE 63, SEXUAL PARTNERS MORE THAN 5  Other Topics Concern   Not on file  Social History Narrative   Not on file   Social Determinants of Health   Financial Resource Strain: Not on file  Food Insecurity: No Food Insecurity (03/12/2023)   Hunger Vital Sign    Worried About Running Out of Food in the Last Year: Never true    Ran Out of Food in the Last Year: Never true  Transportation Needs: No Transportation Needs (03/12/2023)   PRAPARE - Administrator, Civil Service (Medical): No    Lack of Transportation (Non-Medical): No  Physical Activity: Not on file  Stress: Not on file  Social Connections: Not on file  Intimate Partner  Violence: Not on file     No Known Allergies   Outpatient Medications Prior to Visit  Medication Sig Dispense Refill   ALPRAZolam (XANAX) 0.5 MG tablet Take 0.5 mg by mouth 2 (two) times daily.  1   amLODipine (NORVASC) 5 MG tablet Take 1 tablet (5 mg total) by mouth daily. 90 tablet 3   Ascorbic Acid (VITAMIN C PO) Take 1 tablet by mouth daily.      aspirin EC 81 MG tablet Take 1 tablet (81 mg total) by mouth daily. Swallow whole.     CALCIUM PO Take 1 tablet by mouth daily.      cetirizine (ZYRTEC) 10 MG tablet Take 10 mg by mouth 2 (two) times daily.     Cholecalciferol (VITAMIN D PO) Take 1 tablet by mouth daily.     Cyanocobalamin (VITAMIN B 12 PO) Take 1 tablet by mouth every other day.     cyclobenzaprine (FLEXERIL) 5 MG tablet Take 1 tablet (5 mg total) by mouth at bedtime. 30 tablet 1   fish oil-omega-3 fatty acids 1000 MG capsule Take 2 g by mouth daily.       furosemide (LASIX) 20 MG tablet Take 1 tablet (20 mg total) by mouth 2 (two) times daily. 180 tablet 3   gabapentin (NEURONTIN) 300 MG capsule Take 300 mg by mouth 2 (two) times daily.     Multiple Vitamin (MULTIVITAMIN PO) Take 1 tablet by mouth daily. Reported on 04/28/2016     potassium chloride SA (KLOR-CON M) 20 MEQ tablet Take 1 tablet (20 mEq total) by mouth daily. 90 tablet 3   rosuvastatin (CRESTOR) 40 MG tablet Take 1 tablet (40 mg total) by mouth daily. 90 tablet 3   ticagrelor (BRILINTA) 90 MG TABS tablet Take 1 tablet (90 mg total) by mouth 2 (two) times daily. 180 tablet 3   VITAMIN A PO Take 1 tablet by mouth every other day. (Occasionally)     VITAMIN E PO Take 1 tablet by mouth every other day.  (Occasionally)     losartan (COZAAR) 50 MG tablet Take 1 tablet (50 mg total) by mouth daily. 90 tablet 3   nitroGLYCERIN (NITROSTAT) 0.4 MG SL tablet Place 1 tablet (0.4 mg total) under the tongue every 5 (five) minutes as needed for chest pain. 25 tablet 3   No facility-administered medications prior to visit.          Objective:   Physical Exam Vitals:   09/01/23 1507  BP: (!) 153/85  Pulse: 99  Temp: 98.2 F (36.8 C)  TempSrc: Oral  SpO2: 98%  Gen: Pleasant, well-nourished, in no distress,  normal affect  ENT: No lesions,  mouth clear,  oropharynx clear, no postnasal drip  Neck: No JVD, no stridor  Lungs: No use of accessory muscles, no crackles or wheezing on normal respiration, no wheeze on forced expiration  Cardiovascular: RRR, heart sounds normal, no murmur or gallops, no peripheral edema  Musculoskeletal: No deformities, no cyanosis or clubbing  Neuro: alert, awake, non focal  Skin: Warm, no lesions or rash      Assessment & Plan:  COPD (chronic obstructive pulmonary disease) (HCC) Evidence for mixed obstruction and restriction on PFT.  That said she has good functional capacity.  I do not think we need to start scheduled BD therapy at this time.  I do want her to have an albuterol available to use if needed.  Congratulated her on her smoking cessation.  Episodic tobacco dependence Participating in the lung cancer screening program.  Her next scan is in April 2025.    Levy Pupa, MD, PhD 09/01/2023, 3:52 PM [facility] [phone] or if no answer before 7:00PM call [phone] For any issues after 7:00PM please call eLink [phone]

## 2023-09-01 NOTE — Progress Notes (Signed)
Full PFT performed today. °

## 2023-09-01 NOTE — Assessment & Plan Note (Signed)
Evidence for mixed obstruction and restriction on PFT.  That said she has good functional capacity.  I do not think we need to start scheduled BD therapy at this time.  I do want her to have an albuterol available to use if needed.  Congratulated her on her smoking cessation.

## 2023-09-02 ENCOUNTER — Other Ambulatory Visit: Payer: Self-pay | Admitting: Family

## 2023-09-02 LAB — PULMONARY FUNCTION TEST
DL/VA % pred: 104 %
DL/VA: 4.45 ml/min/mmHg/L
DLCO cor % pred: 80 %
DLCO cor: 13.59 ml/min/mmHg
DLCO unc % pred: 80 %
DLCO unc: 13.59 ml/min/mmHg
FEF 25-75 Post: 2 L/s
FEF 25-75 Pre: 1.38 L/s
FEF2575-%Change-Post: 45 %
FEF2575-%Pred-Post: 115 %
FEF2575-%Pred-Pre: 79 %
FEV1-%Change-Post: 10 %
FEV1-%Pred-Post: 78 %
FEV1-%Pred-Pre: 71 %
FEV1-Post: 1.52 L
FEV1-Pre: 1.38 L
FEV1FVC-%Change-Post: 0 %
FEV1FVC-%Pred-Pre: 103 %
FEV6-%Change-Post: 11 %
FEV6-%Pred-Post: 79 %
FEV6-%Pred-Pre: 71 %
FEV6-Post: 1.93 L
FEV6-Pre: 1.74 L
FEV6FVC-%Pred-Post: 104 %
FEV6FVC-%Pred-Pre: 104 %
FVC-%Change-Post: 11 %
FVC-%Pred-Post: 75 %
FVC-%Pred-Pre: 68 %
FVC-Post: 1.93 L
FVC-Pre: 1.74 L
Post FEV1/FVC ratio: 79 %
Post FEV6/FVC ratio: 100 %
Pre FEV1/FVC ratio: 79 %
Pre FEV6/FVC Ratio: 100 %
RV % pred: 107 %
RV: 2.12 L
TLC % pred: 89 %
TLC: 4 L

## 2023-09-09 ENCOUNTER — Other Ambulatory Visit: Payer: Self-pay | Admitting: Family

## 2023-09-09 NOTE — Telephone Encounter (Signed)
Requested medications are due for refill today.  Unsure  Requested medications are on the active medications list.  yes  Last refill. 06/21/2023   Future visit scheduled.   yes  Notes to clinic.  Medication is  historical.    Requested Prescriptions  Pending Prescriptions Disp Refills   gabapentin (NEURONTIN) 300 MG capsule [Pharmacy Med Name: GABAPENTIN 300MG  CAPSULES] 60 capsule     Sig: TAKE 1 CAPSULE(300 MG) BY MOUTH TWICE DAILY     Neurology: Anticonvulsants - gabapentin Passed - 09/09/2023  5:15 PM      Passed - Cr in normal range and within 360 days    Creatinine, Ser  Date Value Ref Range Status  04/23/2023 0.88 0.57 - 1.00 mg/dL Final         Passed - Completed PHQ-2 or PHQ-9 in the last 360 days      Passed - Valid encounter within last 12 months    Recent Outpatient Visits           2 weeks ago Prediabetes   [facility], [place], NP   6 months ago Medicare annual wellness visit, subsequent   [facility], [place], [place]   1 year ago Primary hypertension   [place] [facility], [place], NP   1 year ago Essential (primary) hypertension   [place] [facility], [place], NP   1 year ago Medicare annual wellness visit, subsequent   [place] [facility] Salomon Fick, NP       Future Appointments             In 1 month Clifton James, Nile Dear, MD [facility]   In 2 months [name], NP [facility]   In 5 months [name], NP [facility] at [facility]

## 2023-09-10 NOTE — Telephone Encounter (Signed)
Gabapentin appears as historical medication. Schedule appointment.

## 2023-09-13 ENCOUNTER — Ambulatory Visit: Payer: Medicare Other | Admitting: Nurse Practitioner

## 2023-09-14 ENCOUNTER — Other Ambulatory Visit (HOSPITAL_COMMUNITY): Payer: Self-pay

## 2023-09-20 ENCOUNTER — Ambulatory Visit: Payer: Medicare Other | Admitting: Nurse Practitioner

## 2023-09-30 ENCOUNTER — Other Ambulatory Visit: Payer: Self-pay | Admitting: Family

## 2023-10-01 NOTE — Telephone Encounter (Signed)
Gabapentin appears as historical medication. Schedule appointment.

## 2023-10-05 ENCOUNTER — Telehealth: Payer: Self-pay | Admitting: Family

## 2023-10-05 NOTE — Telephone Encounter (Signed)
Gabapentin appears as historical medication. Schedule appointment.

## 2023-10-05 NOTE — Telephone Encounter (Signed)
This was discontinued in 2023.

## 2023-10-05 NOTE — Telephone Encounter (Signed)
Copied from CRM [id number]. Topic: General - Other >> Oct 05, 2023  4:03 PM Phill Myron wrote: Gabapentin (NEURONTIN) 300 MG capsule... Brittany Duffy is questioning why was her medication denied and why does she have to come in, when she just had an appt last month. Please advise

## 2023-10-10 NOTE — Progress Notes (Unsigned)
No chief complaint on file.  History of Present Illness: 67 yo female with history of CAD, HTN and chronic diastolic CHF who is here today for follow up. She has been followed in our office by Dr. Katrinka Blazing. She was admitted in April 2024 with an inferior STEMI secondary thrombotic occlusion of the proximal/mid RCA that was treated with overlapping drug eluting stents. Echo May 2024 with LVEF=65-70%, no significant valve disease elevated PA pressure. Limited echo August 2024 with normal LV and RV function and normal PA pressure.   She is here today for follow up. The patient denies any chest pain, dyspnea, palpitations, lower extremity edema, orthopnea, PND, dizziness, near syncope or syncope.   Primary Care Physician: [name], NP   Past Medical History:  Diagnosis Date   Bronchitis    CAD (coronary artery disease) 06/21/2023   Inf STEMI 02/2023 s/p 3 x 32 mm and 3 x 38 mm DES to [facility] LHC 03/09/23: LAD mid 30, dist 30; LCx mid 30; RCA prox thrombotic 100, mid 90 TTE 03/10/23: EF 65-70, no RWMA, mild LVH, Gr 1 DD, NL RVSF, mod elevated PASP, RVSP 47.3, trivial MR, AV sclerosis     Dysplasia of cervix, low grade (CIN 1)    S/P CRYO   Fibroid    Hepatitis C    Hypertension    Osteopenia 08/2018   T score -1.9 FRAX 4.7% / 1%   Vaginal intraepithelial neoplasia III (VAIN III)     Past Surgical History:  Procedure Laterality Date   CARPAL TUNNEL RELEASE     CO2 LSAER OF VAGINA  07, 11   CORONARY/GRAFT ACUTE MI REVASCULARIZATION N/A 03/09/2023   Procedure: Coronary/Graft Acute MI Revascularization;  Surgeon: Kathleene Hazel, MD;  [facility];  Service: Cardiovascular;  Laterality: N/A;   DIVERTICULUM OF THE BLADDER     LEFT HEART CATH AND CORONARY ANGIOGRAPHY N/A 03/09/2023   Procedure: LEFT HEART CATH AND CORONARY ANGIOGRAPHY;  Surgeon: Kathleene Hazel, MD;  [facility];  Service: Cardiovascular;  Laterality: N/A;   OOPHORECTOMY      BSO   TOTAL ABDOMINAL HYSTERECTOMY  2004   BSO    Current Outpatient Medications  Medication Sig Dispense Refill   albuterol (VENTOLIN HFA) 108 (90 Base) MCG/ACT inhaler Inhale 2 puffs into the lungs every 4 (four) hours as needed for wheezing or shortness of breath. 8 g 3   ALPRAZolam (XANAX) 0.5 MG tablet Take 0.5 mg by mouth 2 (two) times daily.  1   amLODipine (NORVASC) 5 MG tablet Take 1 tablet (5 mg total) by mouth daily. 90 tablet 3   Ascorbic Acid (VITAMIN C PO) Take 1 tablet by mouth daily.      aspirin EC 81 MG tablet Take 1 tablet (81 mg total) by mouth daily. Swallow whole.     CALCIUM PO Take 1 tablet by mouth daily.      cetirizine (ZYRTEC) 10 MG tablet Take 10 mg by mouth 2 (two) times daily.     Cholecalciferol (VITAMIN D PO) Take 1 tablet by mouth daily.     Cyanocobalamin (VITAMIN B 12 PO) Take 1 tablet by mouth every other day.     cyclobenzaprine (FLEXERIL) 5 MG tablet Take 1 tablet (5 mg total) by mouth at bedtime. 30 tablet 1   fish oil-omega-3 fatty acids 1000 MG capsule Take 2 g by mouth daily.       furosemide (LASIX) 20 MG tablet  Take 1 tablet (20 mg total) by mouth 2 (two) times daily. 180 tablet 3   gabapentin (NEURONTIN) 300 MG capsule Take 300 mg by mouth 2 (two) times daily.     losartan (COZAAR) 50 MG tablet Take 1 tablet (50 mg total) by mouth daily. 90 tablet 3   Multiple Vitamin (MULTIVITAMIN PO) Take 1 tablet by mouth daily. Reported on 04/28/2016     nitroGLYCERIN (NITROSTAT) 0.4 MG SL tablet Place 1 tablet (0.4 mg total) under the tongue every 5 (five) minutes as needed for chest pain. 25 tablet 3   potassium chloride SA (KLOR-CON M) 20 MEQ tablet Take 1 tablet (20 mEq total) by mouth daily. 90 tablet 3   rosuvastatin (CRESTOR) 40 MG tablet Take 1 tablet (40 mg total) by mouth daily. 90 tablet 3   ticagrelor (BRILINTA) 90 MG TABS tablet Take 1 tablet (90 mg total) by mouth 2 (two) times daily. 180 tablet 3   VITAMIN A PO Take 1 tablet by mouth every  other day. (Occasionally)     VITAMIN E PO Take 1 tablet by mouth every other day.  (Occasionally)     No current facility-administered medications for this visit.    No Known Allergies  Social History   Socioeconomic History   Marital status: Single    Spouse name: Not on file   Number of children: Not on file   Years of education: Not on file   Highest education level: Not on file  Occupational History   Not on file  Tobacco Use   Smoking status: Every Day    Current packs/day: 1.00    Types: Cigarettes    Passive exposure: Current   Smokeless tobacco: Current   Tobacco comments:    Pt has not smoked since end of May 2024. ARJ 05/19/23  Vaping Use   Vaping status: Never Used  Substance and Sexual Activity   Alcohol use: Yes    Comment: occassional   Drug use: No   Sexual activity: Yes    Partners: Male    Birth control/protection: Surgical    Comment: Hyst., INTERCOURSE AGE 25, SEXUAL PARTNERS MORE THAN 5  Other Topics Concern   Not on file  Social History Narrative   Not on file   Social Determinants of Health   Financial Resource Strain: Not on file  Food Insecurity: No Food Insecurity (03/12/2023)   Hunger Vital Sign    Worried About Running Out of Food in the Last Year: Never true    Ran Out of Food in the Last Year: Never true  Transportation Needs: No Transportation Needs (03/12/2023)   PRAPARE - Administrator, Civil Service (Medical): No    Lack of Transportation (Non-Medical): No  Physical Activity: Not on file  Stress: Not on file  Social Connections: Not on file  Intimate Partner Violence: Not on file    Family History  Problem Relation Age of Onset   Diabetes Mother    Hypertension Mother    Breast cancer Mother        Age Late 19's   Hypertension Maternal Grandfather     Review of Systems:  As stated in the HPI and otherwise negative.   LMP 01/23/2012   Physical Examination: General: Well developed, well nourished, NAD   HEENT: OP clear, mucus membranes moist  SKIN: warm, dry. No rashes. Neuro: No focal deficits  Musculoskeletal: Muscle strength 5/5 all ext  Psychiatric: Mood and affect normal  Neck: No JVD,  no carotid bruits, no thyromegaly, no lymphadenopathy.  Lungs:Clear bilaterally, no wheezes, rhonci, crackles Cardiovascular: Regular rate and rhythm. No murmurs, gallops or rubs. Abdomen:Soft. Bowel sounds present. Non-tender.  Extremities: No lower extremity edema. Pulses are 2 + in the bilateral DP/PT.  EKG:  EKG {ACTION; IS/IS [id number]} ordered today. The ekg ordered today demonstrates ***  Recent Labs: 02/19/2023: TSH 0.842 03/10/2023: Hemoglobin 10.6; Platelets 163 03/11/2023: Magnesium 2.2 04/23/2023: ALT 10; BUN 9; Creatinine, Ser 0.88; Potassium 3.9; Sodium 142   Lipid Panel    Component Value Date/Time   CHOL 126 04/23/2023 1142   TRIG 82 04/23/2023 1142   HDL 67 04/23/2023 1142   CHOLHDL 1.9 04/23/2023 1142   CHOLHDL 2.5 03/09/2023 1701   VLDL 21 03/09/2023 1701   LDLCALC 43 04/23/2023 1142     Wt Readings from Last 3 Encounters:  08/25/23 68.9 kg  06/21/23 67.6 kg  05/19/23 67.2 kg      Assessment and Plan:   1. CAD without angina: No chest pain suggestive of angina. Continue ASA and Brilinta for one year post MI (Can stop Brilinta in May 2025). Continue statin   2. Chronic diastolic CHF: Weight is stable. No volume overload. Continue Lasix.   3. Hyperlipidemia: LDL at goal in June 2024. Continue statin  4. HTN: BP is controlled. No changes  Labs/ tests ordered today include:  No orders of the defined types were placed in this encounter.    Disposition:   F/U with me in ***    Signed, Verne Carrow, MD, [facility] 10/10/2023 9:00 PM    [facility] [address] [place], [place], [place]  [address] Phone: [phone]; Fax: [phone]

## 2023-10-11 ENCOUNTER — Encounter: Payer: Self-pay | Admitting: Cardiovascular Disease

## 2023-10-11 ENCOUNTER — Ambulatory Visit: Payer: Medicare Other | Attending: Cardiovascular Disease | Admitting: Cardiovascular Disease

## 2023-10-11 ENCOUNTER — Other Ambulatory Visit: Payer: Self-pay | Admitting: Family

## 2023-10-11 VITALS — BP 138/78 | HR 91 | Ht 60.0 in | Wt 153.0 lb

## 2023-10-11 DIAGNOSIS — E7800 Pure hypercholesterolemia, unspecified: Secondary | ICD-10-CM | POA: Diagnosis not present

## 2023-10-11 DIAGNOSIS — I10 Essential (primary) hypertension: Secondary | ICD-10-CM

## 2023-10-11 DIAGNOSIS — I2510 Atherosclerotic heart disease of native coronary artery without angina pectoris: Secondary | ICD-10-CM

## 2023-10-11 DIAGNOSIS — I5032 Chronic diastolic (congestive) heart failure: Secondary | ICD-10-CM | POA: Diagnosis not present

## 2023-10-11 DIAGNOSIS — M792 Neuralgia and neuritis, unspecified: Secondary | ICD-10-CM

## 2023-10-11 MED ORDER — GABAPENTIN 300 MG PO CAPS: 300.0000 mg | ORAL_CAPSULE | Freq: Two times a day (BID) | ORAL | 2 refills | Status: DC

## 2023-10-11 NOTE — Patient Instructions (Signed)
Medication Instructions:  No changes *If you need a refill on your cardiac medications before your next appointment, please call your pharmacy*   Lab Work: none   Testing/Procedures: none   Follow-Up: At [facility], you and your health needs are our priority.  As part of our continuing mission to provide you with exceptional heart care, we have created designated Provider Care Teams.  These Care Teams include your primary Cardiologist (physician) and Advanced Practice Providers (APPs -  Physician Assistants and Nurse Practitioners) who all work together to provide you with the care you need, when you need it.   Your next appointment:   6 month(s)  Provider:   Lauree Chandler, MD

## 2023-10-11 NOTE — Telephone Encounter (Signed)
Complete

## 2023-10-12 NOTE — Telephone Encounter (Signed)
Noted  

## 2023-10-12 NOTE — Telephone Encounter (Signed)
Sent mychart message to patient to contact the office to schedule an appointment this week.

## 2023-11-01 ENCOUNTER — Other Ambulatory Visit (HOSPITAL_COMMUNITY): Payer: Self-pay

## 2023-11-02 ENCOUNTER — Other Ambulatory Visit (HOSPITAL_COMMUNITY): Payer: Self-pay

## 2023-11-09 ENCOUNTER — Ambulatory Visit (INDEPENDENT_AMBULATORY_CARE_PROVIDER_SITE_OTHER): Payer: Medicare Other | Admitting: Nurse Practitioner

## 2023-11-09 ENCOUNTER — Other Ambulatory Visit (HOSPITAL_COMMUNITY)
Admission: RE | Admit: 2023-11-09 | Discharge: 2023-11-09 | Disposition: A | Discharge status: Home / Self Care | Payer: Medicare Other | Source: Ambulatory Visit | Attending: Nurse Practitioner | Admitting: Nurse Practitioner

## 2023-11-09 ENCOUNTER — Encounter: Payer: Self-pay | Admitting: Nurse Practitioner

## 2023-11-09 VITALS — BP 124/80 | HR 91 | Ht 59.5 in | Wt 152.0 lb

## 2023-11-09 DIAGNOSIS — Z1151 Encounter for screening for human papillomavirus (HPV): Secondary | ICD-10-CM | POA: Diagnosis not present

## 2023-11-09 DIAGNOSIS — Z87411 Personal history of vaginal dysplasia: Secondary | ICD-10-CM | POA: Insufficient documentation

## 2023-11-09 DIAGNOSIS — Z780 Asymptomatic menopausal state: Secondary | ICD-10-CM | POA: Diagnosis not present

## 2023-11-09 DIAGNOSIS — Z01419 Encounter for gynecological examination (general) (routine) without abnormal findings: Secondary | ICD-10-CM | POA: Diagnosis present

## 2023-11-09 DIAGNOSIS — Z9189 Other specified personal risk factors, not elsewhere classified: Secondary | ICD-10-CM

## 2023-11-09 DIAGNOSIS — Z1272 Encounter for screening for malignant neoplasm of vagina: Secondary | ICD-10-CM | POA: Diagnosis not present

## 2023-11-09 DIAGNOSIS — M8589 Other specified disorders of bone density and structure, multiple sites: Secondary | ICD-10-CM

## 2023-11-09 NOTE — Progress Notes (Signed)
 Brittany Duffy 1956-09-21 [id number]   History:  67 y.o. G2P1011 presents for breast and pelvic exam. No GYN complaints. S/P 2004 TAH BSO for fibroids. 2011 VAIN 3 with CO2 laser, cryosurgery years ago. Osteopenia. Normal mammogram history. HTN, HLD, COPD managed by PCP. Smoker.   Gynecologic History Patient's last menstrual period was 01/23/2012.   Contraception: status post hysterectomy Sexually active: Yes  Health maintenance Last Pap (vaginal): 08/29/2020. Results were: Normal, 3-year repeat Last mammogram: 05/10/2023. Results were: Normal Last colonoscopy: 2012. Results were: benign polyps Last Dexa: 10/20/2022. Results were: T-score -1.7, FRAX 4.1% / 1.1%  Past medical history, past surgical history, family history and social history were all reviewed and documented in the EPIC chart. Retired. Son. Mother diagnosed with breast cancer in late 38s.   ROS:  A ROS was performed and pertinent positives and negatives are included.  Exam:  Vitals:   11/09/23 1140  BP: 124/80  Pulse: 91  SpO2: 97%  Weight: 152 lb (68.9 kg)  Height: 4' 11.5 (1.511 m)      Body mass index is 30.19 kg/m.  General appearance:  Normal Thyroid :  Symmetrical, normal in size, without palpable masses or nodularity. Respiratory  Auscultation:  Clear without wheezing or rhonchi Cardiovascular  Auscultation:  Regular rate, without rubs, murmurs or gallops  Edema/varicosities:  Not grossly evident Abdominal  Soft,nontender, without masses, guarding or rebound.  Liver/spleen:  No organomegaly noted  Hernia:  None appreciated  Skin  Inspection:  Grossly normal   Breasts: Examined lying and sitting.   Right: Without masses, retractions, discharge or axillary adenopathy.   Left: Without masses, retractions, discharge or axillary adenopathy. Pelvic: External genitalia:  no lesions              Urethra:  normal appearing urethra with no masses, tenderness or lesions               Bartholins and Skenes: normal                 Vagina: normal appearing vagina with normal color and discharge, no lesions              Cervix: absent Bimanual Exam:  Uterus: absent              Adnexa: no mass, fullness, tenderness              Rectovaginal: Deferred              Anus:  normal, no lesions  Patient informed chaperone available to be present for breast and pelvic exam. Patient has requested no chaperone to be present. Patient has been advised what will be completed during breast and pelvic exam.   Assessment/Plan:  67 y.o. G2P1011 for breast and pelvic exam.   Encounter for breast and pelvic examination - Education provided on SBEs, importance of preventative screenings, current guidelines, high calcium  diet, regular exercise, and multivitamin daily.  Labs with PCP.   Postmenopausal - Stopped ERT a couple of years ago, doing fine. S/P TAH BSO.   Osteopenia of multiple sites - 10/2022 T-score -1.7 without elevated FRAX. Continue Vitamin D and Calcium  supplements and increase exercise.   History of vaginal dysplasia - Plan: Cytology - PAP( [facility]). 2011 VAIN 3 with laser Co2, cryosurgery many years ago for CIN-1 subsequent paps normal.    Screening for breast cancer - Normal mammogram history.  Continue annual screenings. Normal breast exam today.  Screening for colon cancer - 2012 colonoscopy.  Overdue and encouraged to schedule.   Return in about 1 year (around 11/08/2024) for B&P (high risk).     Brittany Duffy [facility], 12:22 PM 11/09/2023

## 2023-11-16 LAB — CYTOLOGY - PAP
Comment: NEGATIVE
Diagnosis: NEGATIVE
High risk HPV: NEGATIVE

## 2023-11-24 ENCOUNTER — Ambulatory Visit (INDEPENDENT_AMBULATORY_CARE_PROVIDER_SITE_OTHER): Payer: Medicare Other | Admitting: Family

## 2023-11-24 VITALS — BP 144/78 | HR 89 | Temp 98.8°F | Ht 59.5 in | Wt 154.8 lb

## 2023-11-24 DIAGNOSIS — R7303 Prediabetes: Secondary | ICD-10-CM | POA: Diagnosis not present

## 2023-11-24 DIAGNOSIS — M62838 Other muscle spasm: Secondary | ICD-10-CM

## 2023-11-24 LAB — POCT GLYCOSYLATED HEMOGLOBIN (HGB A1C): HbA1c, POC (controlled diabetic range): 6.3 % (ref 0.0–7.0)

## 2023-11-24 MED ORDER — CYCLOBENZAPRINE HCL 5 MG PO TABS: 5.0000 mg | ORAL_TABLET | Freq: Every day | ORAL | 1 refills | Status: DC

## 2023-11-24 NOTE — Progress Notes (Signed)
 Patient ID: Brittany Duffy, female    DOB: 07/21/1956  MRN: [id number]  CC: Chronic Conditions Follow-Up  Subjective: Brittany Duffy is a 68 y.o. female who presents for chronic conditions follow-up.   Her concerns today include:  - Prediabetes.  - Doing well on Cyclobenzaprine , no issues/concerns.  - Established with Cardiology for chronic conditions.   Patient Active Problem List   Diagnosis Date Noted   CAD (coronary artery disease) 06/21/2023   Essential hypertension 06/21/2023   COPD (chronic obstructive pulmonary disease) (HCC) 05/19/2023   (HFpEF) heart failure with preserved ejection fraction (HCC) 03/24/2023   Other secondary pulmonary hypertension (HCC) 03/24/2023   Hx of Inf STEMI in 02/2023 03/09/2023   Prediabetes 05/10/2021   Hyperlipidemia 05/10/2021   History of total abdominal hysterectomy 08/27/2020   Episodic tobacco dependence 12/14/2015   Chronic active hepatitis C (HCC) 12/14/2015   Hepatitis, viral 09/25/2015   Allergic urticaria 09/25/2015   Ovarian abscess    Vaginal intraepithelial neoplasia III (VAIN III)    Osteopenia      Current Outpatient Medications on File Prior to Visit  Medication Sig Dispense Refill   albuterol  (VENTOLIN  HFA) 108 (90 Base) MCG/ACT inhaler Inhale 2 puffs into the lungs every 4 (four) hours as needed for wheezing or shortness of breath. 8 g 3   ALPRAZolam (XANAX) 0.5 MG tablet Take 0.5 mg by mouth 2 (two) times daily.  1   amLODipine  (NORVASC ) 5 MG tablet Take 1 tablet (5 mg total) by mouth daily. 90 tablet 3   Ascorbic Acid (VITAMIN C PO) Take 1 tablet by mouth daily.      aspirin  EC 81 MG tablet Take 1 tablet (81 mg total) by mouth daily. Swallow whole.     CALCIUM  PO Take 1 tablet by mouth daily.      cetirizine (ZYRTEC) 10 MG tablet Take 10 mg by mouth daily.     Cholecalciferol (VITAMIN D PO) Take 1 tablet by mouth daily.     Cyanocobalamin (VITAMIN B 12 PO) Take 1 tablet by mouth every other day.      fish oil-omega-3 fatty acids 1000 MG capsule Take 2 g by mouth daily.     furosemide  (LASIX ) 20 MG tablet Take 1 tablet (20 mg total) by mouth 2 (two) times daily. 180 tablet 3   gabapentin  (NEURONTIN ) 300 MG capsule Take 1 capsule (300 mg total) by mouth 2 (two) times daily. 60 capsule 2   Multiple Vitamin (MULTIVITAMIN PO) Take 1 tablet by mouth daily. Reported on 04/28/2016     potassium chloride  SA (KLOR-CON  M) 20 MEQ tablet Take 1 tablet (20 mEq total) by mouth daily. 90 tablet 3   rosuvastatin  (CRESTOR ) 40 MG tablet Take 1 tablet (40 mg total) by mouth daily. 90 tablet 3   ticagrelor  (BRILINTA ) 90 MG TABS tablet Take 1 tablet (90 mg total) by mouth 2 (two) times daily. 180 tablet 3   VITAMIN A PO Take 1 tablet by mouth every other day. (Occasionally)     VITAMIN E PO Take 1 tablet by mouth every other day.  (Occasionally)     losartan  (COZAAR ) 50 MG tablet Take 1 tablet (50 mg total) by mouth daily. 90 tablet 3   nitroGLYCERIN  (NITROSTAT ) 0.4 MG SL tablet Place 1 tablet (0.4 mg total) under the tongue every 5 (five) minutes as needed for chest pain. (Patient not taking: Reported on 11/09/2023) 25 tablet 3   No current facility-administered medications on file prior to visit.  No Known Allergies  Social History   Socioeconomic History   Marital status: Single    Spouse name: Not on file   Number of children: Not on file   Years of education: Not on file   Highest education level: Not on file  Occupational History   Not on file  Tobacco Use   Smoking status: Former    Current packs/day: 1.00    Types: Cigarettes    Passive exposure: Current   Smokeless tobacco: Never   Tobacco comments:    Pt has not smoked since end of May 2024. ARJ 05/19/23  Vaping Use   Vaping status: Never Used  Substance and Sexual Activity   Alcohol use: Not Currently    Comment: occassional   Drug use: No   Sexual activity: Yes    Partners: Male    Birth control/protection: Surgical    Comment:  Hyst., INTERCOURSE AGE 20, SEXUAL PARTNERS MORE THAN 5  Other Topics Concern   Not on file  Social History Narrative   Not on file   Social Drivers of Health   Financial Resource Strain: Not on file  Food Insecurity: No Food Insecurity (03/12/2023)   Hunger Vital Sign    Worried About Running Out of Food in the Last Year: Never true    Ran Out of Food in the Last Year: Never true  Transportation Needs: No Transportation Needs (03/12/2023)   PRAPARE - Administrator, Civil Service (Medical): No    Lack of Transportation (Non-Medical): No  Physical Activity: Not on file  Stress: Not on file  Social Connections: Not on file  Intimate Partner Violence: Not on file    Family History  Problem Relation Age of Onset   Diabetes Mother    Hypertension Mother    Breast cancer Mother        Age Late 32's   Hypertension Maternal Grandfather     Past Surgical History:  Procedure Laterality Date   CARPAL TUNNEL RELEASE     CO2 LSAER OF VAGINA  07, 11   CORONARY/GRAFT ACUTE MI REVASCULARIZATION N/A 03/09/2023   Procedure: Coronary/Graft Acute MI Revascularization;  Surgeon: Odie Benne, MD;  [facility];  Service: Cardiovascular;  Laterality: N/A;   DIVERTICULUM OF THE BLADDER     LEFT HEART CATH AND CORONARY ANGIOGRAPHY N/A 03/09/2023   Procedure: LEFT HEART CATH AND CORONARY ANGIOGRAPHY;  Surgeon: Odie Benne, MD;  [facility];  Service: Cardiovascular;  Laterality: N/A;   OOPHORECTOMY     BSO   TOTAL ABDOMINAL HYSTERECTOMY  2004   BSO    ROS: Review of Systems Negative except as stated above  PHYSICAL EXAM: BP (!) 144/78   Pulse 89   Temp 98.8 F (37.1 C) (Oral)   Ht 4' 11.5" (1.511 m)   Wt 154 lb 12.8 oz (70.2 kg)   LMP 01/23/2012   SpO2 98%   BMI 30.74 kg/m   Physical Exam HENT:     Head: Normocephalic and atraumatic.     Nose: Nose normal.     Mouth/Throat:     Mouth: Mucous membranes are moist.      Pharynx: Oropharynx is clear.  Eyes:     Extraocular Movements: Extraocular movements intact.     Conjunctiva/sclera: Conjunctivae normal.     Pupils: Pupils are equal, round, and reactive to light.  Cardiovascular:     Rate and Rhythm: Normal rate and regular rhythm.  Pulses: Normal pulses.     Heart sounds: Normal heart sounds.  Pulmonary:     Effort: Pulmonary effort is normal.     Breath sounds: Normal breath sounds.  Musculoskeletal:        General: Normal range of motion.     Cervical back: Normal range of motion and neck supple.  Neurological:     General: No focal deficit present.     Mental Status: She is alert and oriented to person, place, and time.  Psychiatric:        Mood and Affect: Mood normal.        Behavior: Behavior normal.    Results for orders placed or performed in visit on 11/24/23  POCT glycosylated hemoglobin (Hb A1C)  Result Value Ref Range   Hemoglobin A1C     HbA1c POC (<> result, manual entry)     HbA1c, POC (prediabetic range)     HbA1c, POC (controlled diabetic range) 6.3 0.0 - 7.0 %    ASSESSMENT AND PLAN: 1. Prediabetes (Primary) - Hemoglobin A1c remaining at prediabetes at 6.3%.  - Follow-up with primary provider in 6 months or sooner if needed.  - POCT glycosylated hemoglobin (Hb A1C)  2. Muscle spasm - Continue Cyclobenzaprine  as prescribed. Counseled on medication adherence/adverse effects.  - Follow-up with primary provider in 3 months or sooner if needed.  - cyclobenzaprine  (FLEXERIL ) 5 MG tablet; Take 1 tablet (5 mg total) by mouth at bedtime.  Dispense: 30 tablet; Refill: 1   Patient was given the opportunity to ask questions.  Patient verbalized understanding of the plan and was able to repeat key elements of the plan. Patient was given clear instructions to go to Emergency Department or return to medical center if symptoms don't improve, worsen, or new problems develop.The patient verbalized understanding.   Orders  Placed This Encounter  Procedures   POCT glycosylated hemoglobin (Hb A1C)     Requested Prescriptions   Signed Prescriptions Disp Refills   cyclobenzaprine  (FLEXERIL ) 5 MG tablet 30 tablet 1    Sig: Take 1 tablet (5 mg total) by mouth at bedtime.    Return in about 3 months (around 02/22/2024) for Follow-Up or next available chronic conditions.  Senaida Dama, NP

## 2023-11-24 NOTE — Progress Notes (Signed)
 Patient states no other concerns to discuss.

## 2023-12-03 ENCOUNTER — Other Ambulatory Visit: Payer: Self-pay | Admitting: Family

## 2023-12-03 DIAGNOSIS — Z1231 Encounter for screening mammogram for malignant neoplasm of breast: Secondary | ICD-10-CM

## 2024-01-19 ENCOUNTER — Other Ambulatory Visit: Payer: Self-pay | Admitting: Family

## 2024-01-19 DIAGNOSIS — I10 Essential (primary) hypertension: Secondary | ICD-10-CM

## 2024-01-20 NOTE — Telephone Encounter (Signed)
 Unable to refill per protocol, Rx expired. Discontinued 03/24/23, dose change.  Requested Prescriptions  Pending Prescriptions Disp Refills   losartan (COZAAR) 25 MG tablet [Pharmacy Med Name: LOSARTAN 25MG  TABLETS] 90 tablet 0    Sig: TAKE 1 TABLET(25 MG) BY MOUTH DAILY     Cardiovascular:  Angiotensin Receptor Blockers Failed - 01/20/2024  8:21 AM      Failed - Cr in normal range and within 180 days    Creatinine, Ser  Date Value Ref Range Status  04/23/2023 0.88 0.57 - 1.00 mg/dL Final         Failed - K in normal range and within 180 days    Potassium  Date Value Ref Range Status  04/23/2023 3.9 3.5 - 5.2 mmol/L Final         Failed - Last BP in normal range    BP Readings from Last 1 Encounters:  11/24/23 (!) 144/78         Passed - Patient is not pregnant      Passed - Valid encounter within last 6 months    Recent Outpatient Visits           1 month ago Prediabetes   [place] [facility], [place], [place]   4 months ago Prediabetes   [place] [facility], [place], [place]   11 months ago Medicare annual wellness visit, subsequent   [facility], [place]   1 year ago Primary hypertension   [place] [facility], [place], [place]   1 year ago Essential (primary) hypertension   [place] [facility], [place] Fick, NP       Future Appointments             In 1 month [name], NP [facility]   In 1 month [name], NP [facility] at [facility]

## 2024-01-27 ENCOUNTER — Other Ambulatory Visit: Payer: Self-pay | Admitting: Family

## 2024-01-27 NOTE — Telephone Encounter (Deleted)
 Copied from CRM [id number]. Topic: Clinical - Medication Refill >> Jan 27, 2024  3:22 PM Priscille Loveless wrote: Most Recent Primary Care Visit:  Provider: [name]  Department: [facility] [place]  Visit Type: OFFICE VISIT  Date: 11/24/2023  Medication: losartan (COZAAR) 50 MG tablet  Has the patient contacted their pharmacy? Yes   Is this the correct pharmacy for this prescription?  This is the patient's preferred pharmacy:   [facility] [address] [place] - [address] AT [facility] [address]  [place] [address] Phone: [phone] Fax: [phone]   Has the prescription been filled recently? No  Is the patient out of the medication? Yes  Has the patient been seen for an appointment in the last year OR does the patient have an upcoming appointment? Yes  Can we respond through MyChart? No  Agent: Please be advised that Rx refills may take up to 3 business days. We ask that you follow-up with your pharmacy.

## 2024-01-27 NOTE — Telephone Encounter (Signed)
 Copied from CRM [id number]. Topic: Clinical - Medication Refill >> Jan 27, 2024  3:22 PM Priscille Loveless wrote: Most Recent Primary Care Visit:  Provider: [name]  Department: [facility]  Visit Type: OFFICE VISIT  Date: 11/24/2023  Medication: losartan (COZAAR) 50 MG tablet  Has the patient contacted their pharmacy? Yes   Is this the correct pharmacy for this prescription? Yes This is the patient's preferred pharmacy:   [facility] #[id number], [place] - [address] AT [facility] [address] [place] [address] Phone: [phone] Fax: [phone]   Has the prescription been filled recently? No  Is the patient out of the medication? Yes  Has the patient been seen for an appointment in the last year OR does the patient have an upcoming appointment? Yes  Can we respond through MyChart? No  Agent: Please be advised that Rx refills may take up to 3 business days. We ask that you follow-up with your pharmacy.

## 2024-01-28 NOTE — Telephone Encounter (Signed)
 Requested medication (s) are due for refill today: Yes  Requested medication (s) are on the active medication list: Yes  Last refill:  03/24/23  Future visit scheduled: Yes  Notes to clinic:  Unable to refill per protocol, last refill by another provider. Unable to refill per protocol, Rx expired.      Requested Prescriptions  Pending Prescriptions Disp Refills   losartan (COZAAR) 50 MG tablet 90 tablet 3    Sig: Take 1 tablet (50 mg total) by mouth daily.     Cardiovascular:  Angiotensin Receptor Blockers Failed - 01/28/2024  1:16 PM      Failed - Cr in normal range and within 180 days    Creatinine, Ser  Date Value Ref Range Status  04/23/2023 0.88 0.57 - 1.00 mg/dL Final         Failed - K in normal range and within 180 days    Potassium  Date Value Ref Range Status  04/23/2023 3.9 3.5 - 5.2 mmol/L Final         Failed - Last BP in normal range    BP Readings from Last 1 Encounters:  11/24/23 (!) 144/78         Passed - Patient is not pregnant      Passed - Valid encounter within last 6 months    Recent Outpatient Visits           2 months ago Prediabetes   [facility], [place], [place]   5 months ago Prediabetes   [place] [facility], [place], [place]   11 months ago Medicare annual wellness visit, subsequent   [facility], [place], [place]   1 year ago Primary hypertension   [place] [facility], [place], [place]   1 year ago Essential (primary) hypertension   [facility], [place] Fick, NP       Future Appointments             In 3 weeks [name], NP [facility]   In 3 weeks [name], NP [facility]

## 2024-02-01 ENCOUNTER — Other Ambulatory Visit (HOSPITAL_COMMUNITY): Payer: Self-pay

## 2024-02-09 ENCOUNTER — Encounter: Payer: Self-pay | Admitting: Emergency Medicine

## 2024-02-14 ENCOUNTER — Encounter: Payer: Self-pay | Admitting: Emergency Medicine

## 2024-02-14 ENCOUNTER — Other Ambulatory Visit: Payer: Self-pay | Admitting: Emergency Medicine

## 2024-02-14 DIAGNOSIS — F17200 Nicotine dependence, unspecified, uncomplicated: Secondary | ICD-10-CM

## 2024-02-14 DIAGNOSIS — R918 Other nonspecific abnormal finding of lung field: Secondary | ICD-10-CM

## 2024-02-16 ENCOUNTER — Ambulatory Visit
Admission: RE | Admit: 2024-02-16 | Discharge: 2024-02-16 | Disposition: A | Discharge status: Home / Self Care | Source: Ambulatory Visit | Attending: Emergency Medicine | Admitting: Emergency Medicine

## 2024-02-16 DIAGNOSIS — R918 Other nonspecific abnormal finding of lung field: Secondary | ICD-10-CM

## 2024-02-16 DIAGNOSIS — F17200 Nicotine dependence, unspecified, uncomplicated: Secondary | ICD-10-CM

## 2024-02-23 ENCOUNTER — Ambulatory Visit (INDEPENDENT_AMBULATORY_CARE_PROVIDER_SITE_OTHER): Payer: Medicare Other | Admitting: Family

## 2024-02-23 ENCOUNTER — Encounter: Payer: Self-pay | Admitting: Family

## 2024-02-23 ENCOUNTER — Encounter: Payer: Medicare Other | Admitting: Family

## 2024-02-23 VITALS — BP 130/76 | HR 83 | Temp 98.8°F | Resp 16 | Ht 60.0 in | Wt 153.8 lb

## 2024-02-23 DIAGNOSIS — M792 Neuralgia and neuritis, unspecified: Secondary | ICD-10-CM

## 2024-02-23 DIAGNOSIS — I10 Essential (primary) hypertension: Secondary | ICD-10-CM | POA: Diagnosis not present

## 2024-02-23 DIAGNOSIS — R7303 Prediabetes: Secondary | ICD-10-CM | POA: Diagnosis not present

## 2024-02-23 DIAGNOSIS — M62838 Other muscle spasm: Secondary | ICD-10-CM

## 2024-02-23 DIAGNOSIS — J3089 Other allergic rhinitis: Secondary | ICD-10-CM

## 2024-02-23 MED ORDER — LOSARTAN POTASSIUM 50 MG PO TABS: 50.0000 mg | ORAL_TABLET | Freq: Every day | ORAL | 0 refills | Status: DC

## 2024-02-23 MED ORDER — GABAPENTIN 300 MG PO CAPS: 300.0000 mg | ORAL_CAPSULE | Freq: Two times a day (BID) | ORAL | 2 refills | Status: DC

## 2024-02-23 MED ORDER — AMLODIPINE BESYLATE 5 MG PO TABS: 5.0000 mg | ORAL_TABLET | Freq: Every day | ORAL | 0 refills | Status: DC

## 2024-02-23 MED ORDER — CYCLOBENZAPRINE HCL 5 MG PO TABS: 5.0000 mg | ORAL_TABLET | Freq: Every day | ORAL | 2 refills | Status: DC

## 2024-02-23 MED ORDER — FLUTICASONE PROPIONATE 50 MCG/ACT NA SUSP
2.0000 | Freq: Every day | NASAL | 2 refills | Status: DC
Start: 2024-02-23 — End: 2024-06-02

## 2024-02-23 NOTE — Progress Notes (Signed)
 Erroneous encounter-disregard

## 2024-02-23 NOTE — Progress Notes (Signed)
 3 month follow up, needs a refill on medication

## 2024-02-23 NOTE — Progress Notes (Signed)
 Patient ID: Brittany Duffy, female    DOB: 11/15/1955  MRN: [id number]  CC: Chronic Conditions Follow-Up  Subjective: Brittany Duffy is a 68 y.o. female who presents for chronic conditions follow-up.  Her concerns today include:  - Doing well on Losartan and Amlodipine, no issues/concerns. States Cardiology appointment not until August 2025. She does not complain of red flag symptoms such as but not limited to chest pain, shortness of breath, worst headache of life, nausea/vomiting.  - Doing well on Gabapentin, no issues/concerns. - Doing well on Cyclobenzaprine, no issues/concerns.  - Prediabetes.  - Doing well on Fluticasone, no issues/concerns.   Patient Active Problem List   Diagnosis Date Noted   CAD (coronary artery disease) 06/21/2023   Essential hypertension 06/21/2023   COPD (chronic obstructive pulmonary disease) (HCC) 05/19/2023   (HFpEF) heart failure with preserved ejection fraction (HCC) 03/24/2023   Other secondary pulmonary hypertension (HCC) 03/24/2023   Hx of Inf STEMI in 02/2023 03/09/2023   Prediabetes 05/10/2021   Hyperlipidemia 05/10/2021   History of total abdominal hysterectomy 08/27/2020   Episodic tobacco dependence 12/14/2015   Chronic active hepatitis C (HCC) 12/14/2015   Hepatitis, viral 09/25/2015   Allergic urticaria 09/25/2015   Ovarian abscess    Vaginal intraepithelial neoplasia III (VAIN III)    Osteopenia      Current Outpatient Medications on File Prior to Visit  Medication Sig Dispense Refill   albuterol (VENTOLIN HFA) 108 (90 Base) MCG/ACT inhaler Inhale 2 puffs into the lungs every 4 (four) hours as needed for wheezing or shortness of breath. 8 g 3   ALPRAZolam (XANAX) 0.5 MG tablet Take 0.5 mg by mouth 2 (two) times daily.  1   Ascorbic Acid (VITAMIN C PO) Take 1 tablet by mouth daily.      aspirin EC 81 MG tablet Take 1 tablet (81 mg total) by mouth daily. Swallow whole.     CALCIUM PO Take 1 tablet by mouth daily.       cetirizine (ZYRTEC) 10 MG tablet Take 10 mg by mouth daily.     Cholecalciferol (VITAMIN D PO) Take 1 tablet by mouth daily.     Cyanocobalamin (VITAMIN B 12 PO) Take 1 tablet by mouth every other day.     fish oil-omega-3 fatty acids 1000 MG capsule Take 2 g by mouth daily.     furosemide (LASIX) 20 MG tablet Take 1 tablet (20 mg total) by mouth 2 (two) times daily. 180 tablet 3   Multiple Vitamin (MULTIVITAMIN PO) Take 1 tablet by mouth daily. Reported on 04/28/2016     potassium chloride SA (KLOR-CON M) 20 MEQ tablet Take 1 tablet (20 mEq total) by mouth daily. 90 tablet 3   rosuvastatin (CRESTOR) 40 MG tablet Take 1 tablet (40 mg total) by mouth daily. 90 tablet 3   ticagrelor (BRILINTA) 90 MG TABS tablet Take 1 tablet (90 mg total) by mouth 2 (two) times daily. 180 tablet 3   VITAMIN A PO Take 1 tablet by mouth every other day. (Occasionally)     VITAMIN E PO Take 1 tablet by mouth every other day.  (Occasionally)     nitroGLYCERIN (NITROSTAT) 0.4 MG SL tablet Place 1 tablet (0.4 mg total) under the tongue every 5 (five) minutes as needed for chest pain. (Patient not taking: Reported on 11/09/2023) 25 tablet 3   No current facility-administered medications on file prior to visit.    No Known Allergies  Social History   Socioeconomic History  Marital status: Single    Spouse name: Not on file   Number of children: Not on file   Years of education: Not on file   Highest education level: Not on file  Occupational History   Not on file  Tobacco Use   Smoking status: Former    Current packs/day: 1.00    Types: Cigarettes    Passive exposure: Current   Smokeless tobacco: Never   Tobacco comments:    Pt has not smoked since end of May 2024. ARJ 05/19/23  Vaping Use   Vaping status: Never Used  Substance and Sexual Activity   Alcohol use: Not Currently    Comment: occassional   Drug use: No   Sexual activity: Yes    Partners: Male    Birth control/protection: Surgical     Comment: Hyst., INTERCOURSE AGE 61, SEXUAL PARTNERS MORE THAN 5  Other Topics Concern   Not on file  Social History Narrative   Not on file   Social Drivers of Health   Financial Resource Strain: Low Risk  (02/23/2024)   Overall Financial Resource Strain (CARDIA)    Difficulty of Paying Living Expenses: Not hard at all  Food Insecurity: No Food Insecurity (03/12/2023)   Hunger Vital Sign    Worried About Running Out of Food in the Last Year: Never true    Ran Out of Food in the Last Year: Never true  Transportation Needs: No Transportation Needs (03/12/2023)   PRAPARE - Administrator, Civil Service (Medical): No    Lack of Transportation (Non-Medical): No  Physical Activity: Insufficiently Active (02/23/2024)   Exercise Vital Sign    Days of Exercise per Week: 3 days    Minutes of Exercise per Session: 30 min  Stress: No Stress Concern Present (02/23/2024)   [employer] of Occupational Health - Occupational Stress Questionnaire    Feeling of Stress : Only a little  Social Connections: Moderately Isolated (02/23/2024)   Social Connection and Isolation Panel [NHANES]    Frequency of Communication with Friends and Family: More than three times a week    Frequency of Social Gatherings with Friends and Family: More than three times a week    Attends Religious Services: More than 4 times per year    Active Member of [employer] or Organizations: No    Attends Banker Meetings: Never    Marital Status: Never married  Intimate Partner Violence: Not At Risk (02/23/2024)   Humiliation, Afraid, Rape, and Kick questionnaire    Fear of Current or Ex-Partner: No    Emotionally Abused: No    Physically Abused: No    Sexually Abused: No    Family History  Problem Relation Age of Onset   Diabetes Mother    Hypertension Mother    Breast cancer Mother        Age Late 86's   Hypertension Maternal Grandfather     Past Surgical History:  Procedure Laterality Date    CARPAL TUNNEL RELEASE     CO2 LSAER OF VAGINA  07, 11   CORONARY/GRAFT ACUTE MI REVASCULARIZATION N/A 03/09/2023   Procedure: Coronary/Graft Acute MI Revascularization;  Surgeon: Kathleene Hazel, MD;  Location: [facility];  Service: Cardiovascular;  Laterality: N/A;   DIVERTICULUM OF THE BLADDER     LEFT HEART CATH AND CORONARY ANGIOGRAPHY N/A 03/09/2023   Procedure: LEFT HEART CATH AND CORONARY ANGIOGRAPHY;  Surgeon: Kathleene Hazel, MD;  Location: [facility];  Service: Cardiovascular;  Laterality: N/A;   OOPHORECTOMY     BSO   TOTAL ABDOMINAL HYSTERECTOMY  2004   BSO    ROS: Review of Systems Negative except as stated above  PHYSICAL EXAM: BP 130/76   Pulse 83   Temp 98.8 F (37.1 C) (Oral)   Resp 16   Ht 5' (1.524 m)   Wt 153 lb 12.8 oz (69.8 kg)   LMP 01/23/2012   SpO2 98%   BMI 30.04 kg/m   Physical Exam HENT:     Head: Normocephalic and atraumatic.     Nose: Nose normal.     Mouth/Throat:     Mouth: Mucous membranes are moist.     Pharynx: Oropharynx is clear.  Eyes:     Extraocular Movements: Extraocular movements intact.     Conjunctiva/sclera: Conjunctivae normal.     Pupils: Pupils are equal, round, and reactive to light.  Cardiovascular:     Rate and Rhythm: Normal rate and regular rhythm.     Pulses: Normal pulses.     Heart sounds: Normal heart sounds.  Pulmonary:     Effort: Pulmonary effort is normal.     Breath sounds: Normal breath sounds.  Musculoskeletal:        General: Normal range of motion.     Cervical back: Normal range of motion and neck supple.  Neurological:     General: No focal deficit present.     Mental Status: She is alert and oriented to person, place, and time.  Psychiatric:        Mood and Affect: Mood normal.        Behavior: Behavior normal.     ASSESSMENT AND PLAN: 1. Primary hypertension (Primary) - Continue Losartan and Amlodipine as prescribed.  - Routine screening.  - Counseled on  blood pressure goal of less than 130/80, low-sodium, DASH diet, medication compliance, and 150 minutes of moderate intensity exercise per week as tolerated. Counseled on medication adherence and adverse effects. - Keep all scheduled appointments with Cardiology.  - Follow-up with primary provider as scheduled. - amLODipine (NORVASC) 5 MG tablet; Take 1 tablet (5 mg total) by mouth daily.  Dispense: 90 tablet; Refill: 0 - losartan (COZAAR) 50 MG tablet; Take 1 tablet (50 mg total) by mouth daily.  Dispense: 90 tablet; Refill: 0 - Basic Metabolic Panel  2. Neuropathic pain - Continue Gabapentin as prescribed. Counseled on medication adherence/adverse effects.  - Follow-up with primary provider in 3 months or sooner if needed. - gabapentin (NEURONTIN) 300 MG capsule; Take 1 capsule (300 mg total) by mouth 2 (two) times daily.  Dispense: 60 capsule; Refill: 2  3. Muscle spasm - Continue Cyclobenzaprine as prescribed. Counseled on medication adherence/adverse effects.  - Follow-up with primary provider in 3 months or sooner if needed. - cyclobenzaprine (FLEXERIL) 5 MG tablet; Take 1 tablet (5 mg total) by mouth at bedtime.  Dispense: 30 tablet; Refill: 2  4. Prediabetes - Routine screening.  - Hemoglobin A1c  5. Perennial allergic rhinitis - Fluticasone nasal spray as prescribed. Counseled on medication adherence/adverse effects.  - Follow-up with primary provider in 3 months or sooner if needed. - fluticasone (FLONASE) 50 MCG/ACT nasal spray; Place 2 sprays into both nostrils daily.  Dispense: 16 g; Refill: 2   Patient was given the opportunity to ask questions.  Patient verbalized understanding of the plan and was able to repeat key elements of the plan. Patient was given clear instructions to go to Emergency Department or  return to medical center if symptoms don't improve, worsen, or new problems develop.The patient verbalized understanding.   Orders Placed This Encounter  Procedures    Basic Metabolic Panel   Hemoglobin A1c     Requested Prescriptions   Signed Prescriptions Disp Refills   amLODipine (NORVASC) 5 MG tablet 90 tablet 0    Sig: Take 1 tablet (5 mg total) by mouth daily.   losartan (COZAAR) 50 MG tablet 90 tablet 0    Sig: Take 1 tablet (50 mg total) by mouth daily.   gabapentin (NEURONTIN) 300 MG capsule 60 capsule 2    Sig: Take 1 capsule (300 mg total) by mouth 2 (two) times daily.   fluticasone (FLONASE) 50 MCG/ACT nasal spray 16 g 2    Sig: Place 2 sprays into both nostrils daily.   cyclobenzaprine (FLEXERIL) 5 MG tablet 30 tablet 2    Sig: Take 1 tablet (5 mg total) by mouth at bedtime.    Return in about 3 months (around 05/24/2024) for Follow-Up or next available chronic conditions.  Senaida Dama, NP

## 2024-02-24 ENCOUNTER — Encounter: Payer: Self-pay | Admitting: Family

## 2024-02-24 LAB — BASIC METABOLIC PANEL WITH GFR
BUN/Creatinine Ratio: 15 (ref 12–28)
BUN: 14 mg/dL (ref 8–27)
CO2: 24 mmol/L (ref 20–29)
Calcium: 9.9 mg/dL (ref 8.7–10.3)
Chloride: 104 mmol/L (ref 96–106)
Creatinine, Ser: 0.92 mg/dL (ref 0.57–1.00)
Glucose: 93 mg/dL (ref 70–99)
Potassium: 4 mmol/L (ref 3.5–5.2)
Sodium: 144 mmol/L (ref 134–144)
eGFR: 67 mL/min/{1.73_m2} (ref >59)

## 2024-02-24 LAB — HEMOGLOBIN A1C
Est. average glucose Bld gHb Est-mCnc: 131 mg/dL
Hgb A1c MFr Bld: 6.2 % — ABNORMAL HIGH (ref 4.8–5.6)

## 2024-03-28 ENCOUNTER — Telehealth: Payer: Self-pay

## 2024-03-28 NOTE — Telephone Encounter (Signed)
   Patient Name: Brittany Duffy  DOB: 1956/04/13 MRN: [id number]  Primary Cardiologist: Antoinette Batman, MD  Chart reviewed as part of pre-operative protocol coverage. Cataract extractions are recognized in guidelines as low risk surgeries that do not typically require specific preoperative testing or holding of blood thinner therapy. Therefore, given past medical history and time since last visit, based on ACC/AHA guidelines, Brittany Duffy would be at acceptable risk for the planned procedure without further cardiovascular testing.   I will route this recommendation to the requesting party via Epic fax function and remove from pre-op pool.  Please call with questions.  Ava Boatman, NP 03/28/2024, 5:10 PM

## 2024-03-28 NOTE — Telephone Encounter (Signed)
   Pre-operative Risk Assessment    Patient Name: Brittany Duffy  DOB: Sep 01, 1956 MRN: [id number]   Date of last office visit: 10/11/2023 Date of next office visit: 06/15/2024   Request for Surgical Clearance    Procedure:  Cataract extraction of the right eye  Date of Surgery:  Clearance TBD                                Surgeon:  Not indicated Surgeon's Group or Practice Name:  [facility] Phone number:  [phone] Fax number:  [phone]   Type of Clearance Requested:   - Medical    Type of Anesthesia:  Topical   Additional requests/questions:    Gardiner Jumper   03/28/2024, 1:25 PM

## 2024-04-28 ENCOUNTER — Other Ambulatory Visit (HOSPITAL_COMMUNITY): Payer: Self-pay

## 2024-05-10 ENCOUNTER — Ambulatory Visit: Payer: Medicare Other

## 2024-05-11 ENCOUNTER — Ambulatory Visit
Admission: RE | Admit: 2024-05-11 | Discharge: 2024-05-11 | Disposition: A | Discharge status: Home / Self Care | Source: Ambulatory Visit | Attending: Family | Admitting: Family

## 2024-05-11 DIAGNOSIS — Z1231 Encounter for screening mammogram for malignant neoplasm of breast: Secondary | ICD-10-CM

## 2024-05-17 ENCOUNTER — Ambulatory Visit: Payer: Self-pay | Admitting: Family

## 2024-05-24 ENCOUNTER — Encounter: Admitting: Family

## 2024-05-24 NOTE — Progress Notes (Signed)
 Erroneous encounter-disregard

## 2024-05-25 ENCOUNTER — Ambulatory Visit: Payer: Medicare Other

## 2024-05-25 VITALS — BP 130/76 | Ht 60.0 in | Wt 144.0 lb

## 2024-05-25 DIAGNOSIS — Z0000 Encounter for general adult medical examination without abnormal findings: Secondary | ICD-10-CM | POA: Diagnosis not present

## 2024-05-25 NOTE — Progress Notes (Signed)
 Because this visit was a virtual/telehealth visit,  certain criteria was not obtained, such a blood pressure, CBG if applicable, and timed get up and go. Any medications not marked as taking were not mentioned during the medication reconciliation part of the visit. Any vitals not documented were not able to be obtained due to this being a telehealth visit or patient was unable to self-report a recent blood pressure reading due to a lack of equipment at home via telehealth. Vitals that have been documented are verbally provided by the patient.   This visit was performed by a medical professional under my direct supervision. I was immediately available for consultation/collaboration. I have reviewed and agree with the Annual Wellness Visit documentation.   Subjective:   Brittany Duffy is a 68 y.o. who presents for a Medicare Wellness preventive visit.  As a reminder, Annual Wellness Visits don't include a physical exam, and some assessments may be limited, especially if this visit is performed virtually. We may recommend an in-person follow-up visit with your provider if needed.  Visit Complete: Virtual I connected with  Brittany Duffy on 05/25/24 by a audio enabled telemedicine application and verified that I am speaking with the correct person using two identifiers.  Patient Location: Home  Provider Location: Home Office  I discussed the limitations of evaluation and management by telemedicine. The patient expressed understanding and agreed to proceed.  Vital Signs: Because this visit was a virtual/telehealth visit, some criteria may be missing or patient reported. Any vitals not documented were not able to be obtained and vitals that have been documented are patient reported.  VideoDeclined- This patient declined Librarian, academic. Therefore the visit was completed with audio only.  Persons Participating in Visit: Patient.  AWV Questionnaire: No:  Patient Medicare AWV questionnaire was not completed prior to this visit.  Cardiac Risk Factors include: advanced age (>7men, >8 women);hypertension;dyslipidemia     Objective:    Today's Vitals   05/25/24 1532  BP: 130/76  Weight: 144 lb (65.3 kg)  Height: 5' (1.524 m)   Body mass index is 28.12 kg/m.     05/25/2024    3:32 PM 11/11/2015   11:06 PM  Advanced Directives  Does Patient Have a Medical Advance Directive? No No   Would patient like information on creating a medical advance directive? No - Patient declined      Data saved with a previous flowsheet row definition    Current Medications (verified) Outpatient Encounter Medications as of 05/25/2024  Medication Sig   albuterol  (VENTOLIN  HFA) 108 (90 Base) MCG/ACT inhaler Inhale 2 puffs into the lungs every 4 (four) hours as needed for wheezing or shortness of breath.   ALPRAZolam (XANAX) 0.5 MG tablet Take 0.5 mg by mouth 2 (two) times daily.   amLODipine  (NORVASC ) 5 MG tablet Take 1 tablet (5 mg total) by mouth daily.   Ascorbic Acid (VITAMIN C PO) Take 1 tablet by mouth daily.    aspirin  EC 81 MG tablet Take 1 tablet (81 mg total) by mouth daily. Swallow whole.   CALCIUM  PO Take 1 tablet by mouth daily.    cetirizine (ZYRTEC) 10 MG tablet Take 10 mg by mouth daily.   Cholecalciferol (VITAMIN D PO) Take 1 tablet by mouth daily.   Cyanocobalamin (VITAMIN B 12 PO) Take 1 tablet by mouth every other day.   cyclobenzaprine  (FLEXERIL ) 5 MG tablet Take 1 tablet (5 mg total) by mouth at bedtime.   fish oil-omega-3 fatty  acids 1000 MG capsule Take 2 g by mouth daily.   fluticasone  (FLONASE ) 50 MCG/ACT nasal spray Place 2 sprays into both nostrils daily.   furosemide  (LASIX ) 20 MG tablet Take 1 tablet (20 mg total) by mouth 2 (two) times daily.   gabapentin  (NEURONTIN ) 300 MG capsule Take 1 capsule (300 mg total) by mouth 2 (two) times daily.   losartan  (COZAAR ) 50 MG tablet Take 1 tablet (50 mg total) by mouth daily.    Multiple Vitamin (MULTIVITAMIN PO) Take 1 tablet by mouth daily. Reported on 04/28/2016   potassium chloride  SA (KLOR-CON  M) 20 MEQ tablet Take 1 tablet (20 mEq total) by mouth daily.   rosuvastatin  (CRESTOR ) 40 MG tablet Take 1 tablet (40 mg total) by mouth daily.   ticagrelor  (BRILINTA ) 90 MG TABS tablet Take 1 tablet (90 mg total) by mouth 2 (two) times daily.   VITAMIN A PO Take 1 tablet by mouth every other day. (Occasionally)   VITAMIN E PO Take 1 tablet by mouth every other day.  (Occasionally)   nitroGLYCERIN  (NITROSTAT ) 0.4 MG SL tablet Place 1 tablet (0.4 mg total) under the tongue every 5 (five) minutes as needed for chest pain. (Patient not taking: Reported on 05/25/2024)   No facility-administered encounter medications on file as of 05/25/2024.    Allergies (verified) Patient has no known allergies.   History: Past Medical History:  Diagnosis Date   Bronchitis    CAD (coronary artery disease) 06/21/2023   Inf STEMI 02/2023 s/p 3 x 32 mm and 3 x 38 mm DES to [facility] LHC 03/09/23: LAD mid 30, dist 30; LCx mid 30; RCA prox thrombotic 100, mid 90 TTE 03/10/23: EF 65-70, no RWMA, mild LVH, Gr 1 DD, NL RVSF, mod elevated PASP, RVSP 47.3, trivial MR, AV sclerosis     Dysplasia of cervix, low grade (CIN 1)    S/P CRYO   Fibroid    Heart attack (HCC)    Hepatitis C    Hypertension    Osteopenia 08/2018   T score -1.9 FRAX 4.7% / 1%   Vaginal intraepithelial neoplasia III (VAIN III)    Past Surgical History:  Procedure Laterality Date   CARPAL TUNNEL RELEASE     CO2 LSAER OF VAGINA  07, 11   CORONARY/GRAFT ACUTE MI REVASCULARIZATION N/A 03/09/2023   Procedure: Coronary/Graft Acute MI Revascularization;  Surgeon: Verlin Lonni BIRCH, MD;  Location: [facility] INVASIVE CV [facility];  Service: Cardiovascular;  Laterality: N/A;   DIVERTICULUM OF THE BLADDER     LEFT HEART CATH AND CORONARY ANGIOGRAPHY N/A 03/09/2023   Procedure: LEFT HEART CATH AND CORONARY ANGIOGRAPHY;  Surgeon: Verlin Lonni BIRCH, MD;  Location: [facility] CV [facility];  Service: Cardiovascular;  Laterality: N/A;   OOPHORECTOMY     BSO   TOTAL ABDOMINAL HYSTERECTOMY  2004   BSO   Family History  Problem Relation Age of Onset   Diabetes Mother    Hypertension Mother    Breast cancer Mother        Age Late 22's   Hypertension Maternal Grandfather    Social History   Socioeconomic History   Marital status: Single    Spouse name: Not on file   Number of children: Not on file   Years of education: Not on file   Highest education level: Not on file  Occupational History   Not on file  Tobacco Use   Smoking status: Former    Current packs/day: 1.00    Types: Cigarettes  Passive exposure: Current   Smokeless tobacco: Never   Tobacco comments:    Pt has not smoked since end of May 2024. ARJ 05/19/23  Vaping Use   Vaping status: Never Used  Substance and Sexual Activity   Alcohol use: Not Currently    Comment: occassional   Drug use: No   Sexual activity: Yes    Partners: Male    Birth control/protection: Surgical    Comment: Hyst., INTERCOURSE AGE 47, SEXUAL PARTNERS MORE THAN 5  Other Topics Concern   Not on file  Social History Narrative   Not on file   Social Drivers of Health   Financial Resource Strain: Low Risk  (05/25/2024)   Overall Financial Resource Strain (CARDIA)    Difficulty of Paying Living Expenses: Not hard at all  Food Insecurity: No Food Insecurity (05/25/2024)   Hunger Vital Sign    Worried About Running Out of Food in the Last Year: Never true    Ran Out of Food in the Last Year: Never true  Transportation Needs: No Transportation Needs (05/25/2024)   PRAPARE - Administrator, Civil Service (Medical): No    Lack of Transportation (Non-Medical): No  Physical Activity: Insufficiently Active (05/25/2024)   Exercise Vital Sign    Days of Exercise per Week: 3 days    Minutes of Exercise per Session: 30 min  Stress: No Stress Concern Present (05/25/2024)    [employer] of Occupational Health - Occupational Stress Questionnaire    Feeling of Stress: Only a little  Social Connections: Moderately Isolated (05/25/2024)   Social Connection and Isolation Panel    Frequency of Communication with Friends and Family: More than three times a week    Frequency of Social Gatherings with Friends and Family: More than three times a week    Attends Religious Services: More than 4 times per year    Active Member of [employer] or Organizations: No    Attends Banker Meetings: Never    Marital Status: Never married    Tobacco Counseling Counseling given: Not Answered Tobacco comments: Pt has not smoked since end of May 2024. ARJ 05/19/23    Clinical Intake:  Pre-visit preparation completed: Yes  Pain : No/denies pain     BMI - recorded: 28.12 Nutritional Status: BMI 25 -29 Overweight Nutritional Risks: None Diabetes: No  Lab Results  Component Value Date   HGBA1C 6.2 (H) 02/23/2024   HGBA1C 6.3 11/24/2023   HGBA1C 5.9 08/25/2023     How often do you need to have someone help you when you read instructions, pamphlets, or other written materials from your doctor or pharmacy?: 1 - Never  Interpreter Needed?: No  Information entered by :: Myelle Poteat [email or web address],CMA   Activities of Daily Living     05/25/2024    3:35 PM  In your present state of health, do you have any difficulty performing the following activities:  Hearing? 0  Vision? 0  Difficulty concentrating or making decisions? 0  Walking or climbing stairs? 0  Dressing or bathing? 0  Doing errands, shopping? 0  Preparing Food and eating ? N  Using the Toilet? N  In the past six months, have you accidently leaked urine? N  Do you have problems with loss of bowel control? N  Managing your Medications? N  Managing your Finances? N  Housekeeping or managing your Housekeeping? N    Patient Care Team: Lorren Greig PARAS, NP as PCP - General (Nurse Practitioner) Verlin,  Lonni BIRCH, MD as PCP - Cardiology (Cardiology) Prentiss Annabella LABOR, NP as Nurse Practitioner (Gynecology)  I have updated your Care Teams any recent Medical Services you may have received from other providers in the past year.     Assessment:   This is a routine wellness examination for Brittany Duffy.  Hearing/Vision screen Hearing Screening - Comments:: No hearing difficulties Vision Screening - Comments:: Patient wears glasses   Goals Addressed             This Visit's Progress    Patient Stated       Patient would like to lose some weight       Depression Screen     05/25/2024    3:38 PM 02/23/2024    1:36 PM 08/25/2023    1:49 PM 04/21/2022    1:58 PM 12/23/2021    2:20 PM 08/27/2021    4:30 PM 05/09/2021    2:29 PM  PHQ 2/9 Scores  PHQ - 2 Score 2 0 0 0 1 2 0  PHQ- 9 Score 3 1    5  0    Fall Risk     05/25/2024    3:35 PM 02/23/2024    1:38 PM 11/09/2023   11:52 AM 08/25/2023    1:49 PM 02/19/2023    3:52 PM  Fall Risk   Falls in the past year? 0 0 0 0 0  Number falls in past yr: 0 0 0 0 0  Injury with Fall? 0 0 0 0 0  Risk for fall due to : No Fall Risks No Fall Risks No Fall Risks No Fall Risks   Follow up Falls evaluation completed Falls evaluation completed Falls evaluation completed      MEDICARE RISK AT HOME:  Medicare Risk at Home Any stairs in or around the home?: Yes If so, are there any without handrails?: No Home free of loose throw rugs in walkways, pet beds, electrical cords, etc?: Yes Adequate lighting in your home to reduce risk of falls?: Yes Life alert?: No Use of a cane, walker or w/c?: No Grab bars in the bathroom?: Yes Shower chair or bench in shower?: Yes Elevated toilet seat or a handicapped toilet?: Yes  TIMED UP AND GO:  Was the test performed?  No  Cognitive Function: 6CIT completed        05/25/2024    3:34 PM  6CIT Screen  What Year? 0 points  What month? 0 points  What time? 0 points  Count back from 20 0 points   Months in reverse 0 points  Repeat phrase 0 points  Total Score 0 points    Immunizations Immunization History  Administered Date(s) Administered   Fluad Trivalent(High Dose 65+) 08/25/2023   Influenza,inj,Quad PF,6+ Mos 09/14/2016, 09/29/2018, 08/27/2020, 08/27/2021   Tdap 11/12/2015    Screening Tests Health Maintenance  Topic Date Due   Colonoscopy  Never done   Zoster Vaccines- Shingrix (1 of 2) Never done   Hepatitis B Vaccines (1 of 3 - Risk 3-dose series) Never done   COVID-19 Vaccine (1 - 2024-25 season) Never done   Pneumococcal Vaccine: 50+ Years (1 of 2 - PCV) 11/23/2024 (Originally 07/25/1975)   INFLUENZA VACCINE  06/09/2024   Cervical Cancer Screening (Pap smear)  11/08/2024   Lung Cancer Screening  02/15/2025   Medicare Annual Wellness (AWV)  05/25/2025   DTaP/Tdap/Td (2 - Td or Tdap) 11/11/2025   MAMMOGRAM  05/11/2026   DEXA SCAN  Completed   Hepatitis  C Screening  Completed   HPV VACCINES  Aged Out   Meningococcal B Vaccine  Aged Out    Health Maintenance  Health Maintenance Due  Topic Date Due   Colonoscopy  Never done   Zoster Vaccines- Shingrix (1 of 2) Never done   Hepatitis B Vaccines (1 of 3 - Risk 3-dose series) Never done   COVID-19 Vaccine (1 - 2024-25 season) Never done   Health Maintenance Items Addressed:   Additional Screening:  Vision Screening: Recommended annual ophthalmology exams for early detection of glaucoma and other disorders of the eye. Would you like a referral to an eye doctor? No    Dental Screening: Recommended annual dental exams for proper oral hygiene  Community Resource Referral / Chronic Care Management: CRR required this visit?  No   CCM required this visit?  No   Plan:    I have personally reviewed and noted the following in the patient's chart:   Medical and social history Use of alcohol, tobacco or illicit drugs  Current medications and supplements including opioid prescriptions. Patient is not  currently taking opioid prescriptions. Functional ability and status Nutritional status Physical activity Advanced directives List of other physicians Hospitalizations, surgeries, and ER visits in previous 12 months Vitals Screenings to include cognitive, depression, and falls Referrals and appointments  In addition, I have reviewed and discussed with patient certain preventive protocols, quality metrics, and best practice recommendations. A written personalized care plan for preventive services as well as general preventive health recommendations were provided to patient.   Lyle MARLA Right, [place]   05/25/2024   After Visit Summary: (MyChart) Due to this being a telephonic visit, the after visit summary with patients personalized plan was offered to patient via MyChart   Notes: Nothing significant to report at this time.

## 2024-05-25 NOTE — Patient Instructions (Signed)
 Brittany Duffy , Thank you for taking time out of your busy schedule to complete your Annual Wellness Visit with me. I enjoyed our conversation and look forward to speaking with you again next year. I, as well as your care team,  appreciate your ongoing commitment to your health goals. Please review the following plan we discussed and let me know if I can assist you in the future. Your Game plan/ To Do List    Referrals: If you haven't heard from the office you've been referred to, please reach out to them at the phone provided.  none Follow up Visits: Next Medicare AWV with our clinical staff: 06/07/2024   Have you seen your provider in the last 6 months (3 months if uncontrolled diabetes)? No Next Office Visit with your provider: n/a  Clinician Recommendations:  Aim for 30 minutes of exercise or brisk walking, 6-8 glasses of water, and 5 servings of fruits and vegetables each day.       This is a list of the screening recommended for you and due dates:  Health Maintenance  Topic Date Due   Colon Cancer Screening  Never done   Zoster (Shingles) Vaccine (1 of 2) Never done   Hepatitis B Vaccine (1 of 3 - Risk 3-dose series) Never done   COVID-19 Vaccine (1 - 2024-25 season) Never done   Pneumococcal Vaccine for age over 68 (1 of 2 - PCV) 11/23/2024*   Flu Shot  06/09/2024   Pap Smear  11/08/2024   Screening for Lung Cancer  02/15/2025   Medicare Annual Wellness Visit  05/25/2025   DTaP/Tdap/Td vaccine (2 - Td or Tdap) 11/11/2025   Mammogram  05/11/2026   DEXA scan (bone density measurement)  Completed   Hepatitis C Screening  Completed   HPV Vaccine  Aged Out   Meningitis B Vaccine  Aged Out  *Topic was postponed. The date shown is not the original due date.    Advanced directives: (Declined) Advance directive discussed with you today. Even though you declined this today, please call our office should you change your mind, and we can give you the proper paperwork for you to fill  out. Advance Care Planning is important because it:  [x]  Makes sure you receive the medical care that is consistent with your values, goals, and preferences  [x]  It provides guidance to your family and loved ones and reduces their decisional burden about whether or not they are making the right decisions based on your wishes.  Follow the link provided in your after visit summary or read over the paperwork we have mailed to you to help you started getting your Advance Directives in place. If you need assistance in completing these, please reach out to us  so that we can help you!  See attachments for Preventive Care and Fall Prevention Tips.

## 2024-06-01 ENCOUNTER — Other Ambulatory Visit: Payer: Self-pay | Admitting: Family

## 2024-06-01 DIAGNOSIS — J3089 Other allergic rhinitis: Secondary | ICD-10-CM

## 2024-06-14 ENCOUNTER — Ambulatory Visit: Admitting: Emergency Medicine

## 2024-06-14 ENCOUNTER — Encounter: Payer: Self-pay | Admitting: Emergency Medicine

## 2024-06-14 VITALS — BP 136/84 | HR 89 | Ht 60.0 in | Wt 145.0 lb

## 2024-06-14 DIAGNOSIS — J449 Chronic obstructive pulmonary disease, unspecified: Secondary | ICD-10-CM

## 2024-06-14 DIAGNOSIS — F17200 Nicotine dependence, unspecified, uncomplicated: Secondary | ICD-10-CM

## 2024-06-14 NOTE — Assessment & Plan Note (Signed)
 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  She has good functional capacity, never needs albuterol .  We will hold off on scheduled BD therapy for now, follow clinically

## 2024-06-14 NOTE — Progress Notes (Signed)
 Subjective:    Patient ID: Brittany Duffy, female    DOB: 1956/01/13, 68 y.o.   MRN: [id number]  COPD Her past medical history is significant for COPD.    ROV 06/14/2024 --68 year old woman with coronary disease, hep C, hypertension and evidence for COPD on her pulmonary function testing.  She has mixed obstruction and restriction with good functional capacity.  We held off on starting bronchodilators when I last saw her in October.  She stopped smoking.  She participates in lung cancer screening program, most recent scan in April as below Occasionally she notices some weakness, no SOB. She has albuterol  but never needs albuterol .   CT scan of the chest 02/16/24 reviewed by me showed mild centrilobular emphysema with some bronchial wall thickening, tiny 3.2 mm posterior right lower lobe pulmonary nodule slightly decreased from prior.  Lung RADS 2 study.  Plan for a repeat in April 2026.   Review of Systems As per HPI  Past Medical History:  Diagnosis Date   Bronchitis    CAD (coronary artery disease) 06/21/2023   Inf STEMI 02/2023 s/p 3 x 32 mm and 3 x 38 mm DES to [facility] 03/09/23: LAD mid 30, dist 30; LCx mid 30; RCA prox thrombotic 100, mid 90 TTE 03/10/23: EF 65-70, no RWMA, mild LVH, Gr 1 DD, NL RVSF, mod elevated PASP, RVSP 47.3, trivial MR, AV sclerosis     Dysplasia of cervix, low grade (CIN 1)    S/P CRYO   Fibroid    Heart attack (HCC)    Hepatitis C    Hypertension    Osteopenia 08/2018   T score -1.9 FRAX 4.7% / 1%   Vaginal intraepithelial neoplasia III (VAIN III)      Family History  Problem Relation Age of Onset   Diabetes Mother    Hypertension Mother    Breast cancer Mother        Age Late 12's   Hypertension Maternal Grandfather      Social History   Socioeconomic History   Marital status: Single    Spouse name: Not on file   Number of children: Not on file   Years of education: Not on file   Highest education level: Not on file  Occupational  History   Not on file  Tobacco Use   Smoking status: Former    Current packs/day: 1.00    Types: Cigarettes    Passive exposure: Current   Smokeless tobacco: Never   Tobacco comments:    Pt has not smoked since end of May 2024. ARJ 05/19/23  Vaping Use   Vaping status: Never Used  Substance and Sexual Activity   Alcohol use: Not Currently    Comment: occassional   Drug use: No   Sexual activity: Yes    Partners: Male    Birth control/protection: Surgical    Comment: Hyst., INTERCOURSE AGE 2, SEXUAL PARTNERS MORE THAN 5  Other Topics Concern   Not on file  Social History Narrative   Not on file   Social Drivers of Health   Financial Resource Strain: Low Risk  (05/25/2024)   Overall Financial Resource Strain (CARDIA)    Difficulty of Paying Living Expenses: Not hard at all  Food Insecurity: No Food Insecurity (05/25/2024)   Hunger Vital Sign    Worried About Running Out of Food in the Last Year: Never true    Ran Out of Food in the Last Year: Never true  Transportation Needs: No Transportation  Needs (05/25/2024)   PRAPARE - Administrator, Civil Service (Medical): No    Lack of Transportation (Non-Medical): No  Physical Activity: Insufficiently Active (05/25/2024)   Exercise Vital Sign    Days of Exercise per Week: 3 days    Minutes of Exercise per Session: 30 min  Stress: No Stress Concern Present (05/25/2024)   [employer] of Occupational Health - Occupational Stress Questionnaire    Feeling of Stress: Only a little  Social Connections: Moderately Isolated (05/25/2024)   Social Connection and Isolation Panel    Frequency of Communication with Friends and Family: More than three times a week    Frequency of Social Gatherings with Friends and Family: More than three times a week    Attends Religious Services: More than 4 times per year    Active Member of [employer] or Organizations: No    Attends Banker Meetings: Never    Marital Status: Never  married  Intimate Partner Violence: Not At Risk (05/25/2024)   Humiliation, Afraid, Rape, and Kick questionnaire    Fear of Current or Ex-Partner: No    Emotionally Abused: No    Physically Abused: No    Sexually Abused: No     No Known Allergies   Outpatient Medications Prior to Visit  Medication Sig Dispense Refill   albuterol  (VENTOLIN  HFA) 108 (90 Base) MCG/ACT inhaler Inhale 2 puffs into the lungs every 4 (four) hours as needed for wheezing or shortness of breath. 8 g 3   ALPRAZolam (XANAX) 0.5 MG tablet Take 0.5 mg by mouth 2 (two) times daily.  1   amLODipine  (NORVASC ) 5 MG tablet Take 1 tablet (5 mg total) by mouth daily. 90 tablet 0   Ascorbic Acid (VITAMIN C PO) Take 1 tablet by mouth daily.      aspirin  EC 81 MG tablet Take 1 tablet (81 mg total) by mouth daily. Swallow whole.     CALCIUM  PO Take 1 tablet by mouth daily.      cetirizine (ZYRTEC) 10 MG tablet Take 10 mg by mouth daily.     Cholecalciferol (VITAMIN D PO) Take 1 tablet by mouth daily.     Cyanocobalamin (VITAMIN B 12 PO) Take 1 tablet by mouth every other day.     cyclobenzaprine  (FLEXERIL ) 5 MG tablet Take 1 tablet (5 mg total) by mouth at bedtime. 30 tablet 2   fish oil-omega-3 fatty acids 1000 MG capsule Take 2 g by mouth daily.     fluticasone  (FLONASE ) 50 MCG/ACT nasal spray SHAKE LIQUID AND USE 2 SPRAYS IN EACH NOSTRIL DAILY 16 g 2   furosemide  (LASIX ) 20 MG tablet Take 1 tablet (20 mg total) by mouth 2 (two) times daily. 180 tablet 3   gabapentin  (NEURONTIN ) 300 MG capsule Take 1 capsule (300 mg total) by mouth 2 (two) times daily. 60 capsule 2   losartan  (COZAAR ) 50 MG tablet Take 1 tablet (50 mg total) by mouth daily. 90 tablet 0   Multiple Vitamin (MULTIVITAMIN PO) Take 1 tablet by mouth daily. Reported on 04/28/2016     nitroGLYCERIN  (NITROSTAT ) 0.4 MG SL tablet Place 1 tablet (0.4 mg total) under the tongue every 5 (five) minutes as needed for chest pain. 25 tablet 3   potassium chloride  SA (KLOR-CON   M) 20 MEQ tablet Take 1 tablet (20 mEq total) by mouth daily. 90 tablet 3   rosuvastatin  (CRESTOR ) 40 MG tablet Take 1 tablet (40 mg total) by mouth daily. 90 tablet 3  ticagrelor  (BRILINTA ) 90 MG TABS tablet Take 1 tablet (90 mg total) by mouth 2 (two) times daily. 180 tablet 3   VITAMIN A PO Take 1 tablet by mouth every other day. (Occasionally)     VITAMIN E PO Take 1 tablet by mouth every other day.  (Occasionally)     No facility-administered medications prior to visit.         Objective:   Physical Exam Vitals:   06/14/24 1405 06/14/24 1406  BP: (!) 156/82 136/84  Pulse: 89   SpO2: 98%   Weight: 145 lb (65.8 kg)   Height: 5' (1.524 m)    Gen: Pleasant, well-nourished, in no distress,  normal affect  ENT: No lesions,  mouth clear,  oropharynx clear, no postnasal drip  Neck: No JVD, no stridor  Lungs: No use of accessory muscles, no crackles or wheezing on normal respiration, no wheeze on forced expiration  Cardiovascular: RRR, heart sounds normal, no murmur or gallops, no peripheral edema  Musculoskeletal: No deformities, no cyanosis or clubbing  Neuro: alert, awake, non focal  Skin: Warm, no lesions or rash      Assessment & Plan:  COPD (chronic obstructive pulmonary disease) (HCC) Mild obstruction, mixed disease noted on PFT.  She has good functional capacity, never needs albuterol .  We will hold off on scheduled BD therapy for now, follow clinically  Episodic tobacco dependence She precipitates an lung cancer screening program.  Her most recent scan from April was reassuring.  She needs a repeat in April 2026.     Lamar Chris, MD, PhD 06/14/2024, 2:45 PM [facility] [phone] or if no answer before 7:00PM call [phone] For any issues after 7:00PM please call eLink [phone]

## 2024-06-14 NOTE — Progress Notes (Unsigned)
 No chief complaint on file.  History of Present Illness: 68 yo female with history of CAD, HTN and chronic diastolic CHF who is here today for follow up. She has been followed in our office by Dr. Claudene. She was admitted in April 2024 with an inferior STEMI secondary thrombotic occlusion of the proximal/mid RCA that was treated with overlapping drug eluting stents. Echo May 2024 with LVEF=65-70%, no significant valve disease elevated PA pressure. Limited echo August 2024 with normal LV and RV function and normal PA pressure.   She is here today for follow up. The patient denies any chest pain, dyspnea, palpitations, lower extremity edema, orthopnea, PND, dizziness, near syncope or syncope.    Primary Care Physician: Lorren Greig PARAS, NP   Past Medical History:  Diagnosis Date   Bronchitis    CAD (coronary artery disease) 06/21/2023   Inf STEMI 02/2023 s/p 3 x 32 mm and 3 x 38 mm DES to [facility] LHC 03/09/23: LAD mid 30, dist 30; LCx mid 30; RCA prox thrombotic 100, mid 90 TTE 03/10/23: EF 65-70, no RWMA, mild LVH, Gr 1 DD, NL RVSF, mod elevated PASP, RVSP 47.3, trivial MR, AV sclerosis     Dysplasia of cervix, low grade (CIN 1)    S/P CRYO   Fibroid    Heart attack (HCC)    Hepatitis C    Hypertension    Osteopenia 08/2018   T score -1.9 FRAX 4.7% / 1%   Vaginal intraepithelial neoplasia III (VAIN III)     Past Surgical History:  Procedure Laterality Date   CARPAL TUNNEL RELEASE     CO2 LSAER OF VAGINA  07, 11   CORONARY/GRAFT ACUTE MI REVASCULARIZATION N/A 03/09/2023   Procedure: Coronary/Graft Acute MI Revascularization;  Surgeon: Verlin Lonni BIRCH, MD;  Location: [facility] INVASIVE CV LAB;  Service: Cardiovascular;  Laterality: N/A;   DIVERTICULUM OF THE BLADDER     LEFT HEART CATH AND CORONARY ANGIOGRAPHY N/A 03/09/2023   Procedure: LEFT HEART CATH AND CORONARY ANGIOGRAPHY;  Surgeon: Verlin Lonni BIRCH, MD;  Location: [facility] INVASIVE CV LAB;  Service: Cardiovascular;  Laterality:  N/A;   OOPHORECTOMY     BSO   TOTAL ABDOMINAL HYSTERECTOMY  2004   BSO    Current Outpatient Medications  Medication Sig Dispense Refill   albuterol  (VENTOLIN  HFA) 108 (90 Base) MCG/ACT inhaler Inhale 2 puffs into the lungs every 4 (four) hours as needed for wheezing or shortness of breath. 8 g 3   ALPRAZolam (XANAX) 0.5 MG tablet Take 0.5 mg by mouth 2 (two) times daily.  1   amLODipine  (NORVASC ) 5 MG tablet Take 1 tablet (5 mg total) by mouth daily. 90 tablet 0   Ascorbic Acid (VITAMIN C PO) Take 1 tablet by mouth daily.      aspirin  EC 81 MG tablet Take 1 tablet (81 mg total) by mouth daily. Swallow whole.     CALCIUM  PO Take 1 tablet by mouth daily.      cetirizine (ZYRTEC) 10 MG tablet Take 10 mg by mouth daily.     Cholecalciferol (VITAMIN D PO) Take 1 tablet by mouth daily.     Cyanocobalamin (VITAMIN B 12 PO) Take 1 tablet by mouth every other day.     cyclobenzaprine  (FLEXERIL ) 5 MG tablet Take 1 tablet (5 mg total) by mouth at bedtime. 30 tablet 2   fish oil-omega-3 fatty acids 1000 MG capsule Take 2 g by mouth daily.     fluticasone  (FLONASE ) 50  MCG/ACT nasal spray SHAKE LIQUID AND USE 2 SPRAYS IN EACH NOSTRIL DAILY 16 g 2   furosemide  (LASIX ) 20 MG tablet Take 1 tablet (20 mg total) by mouth 2 (two) times daily. 180 tablet 3   gabapentin  (NEURONTIN ) 300 MG capsule Take 1 capsule (300 mg total) by mouth 2 (two) times daily. 60 capsule 2   losartan  (COZAAR ) 50 MG tablet Take 1 tablet (50 mg total) by mouth daily. 90 tablet 0   Multiple Vitamin (MULTIVITAMIN PO) Take 1 tablet by mouth daily. Reported on 04/28/2016     nitroGLYCERIN  (NITROSTAT ) 0.4 MG SL tablet Place 1 tablet (0.4 mg total) under the tongue every 5 (five) minutes as needed for chest pain. 25 tablet 3   potassium chloride  SA (KLOR-CON  M) 20 MEQ tablet Take 1 tablet (20 mEq total) by mouth daily. 90 tablet 3   rosuvastatin  (CRESTOR ) 40 MG tablet Take 1 tablet (40 mg total) by mouth daily. 90 tablet 3   ticagrelor   (BRILINTA ) 90 MG TABS tablet Take 1 tablet (90 mg total) by mouth 2 (two) times daily. 180 tablet 3   VITAMIN A PO Take 1 tablet by mouth every other day. (Occasionally)     VITAMIN E PO Take 1 tablet by mouth every other day.  (Occasionally)     No current facility-administered medications for this visit.    No Known Allergies  Social History   Socioeconomic History   Marital status: Single    Spouse name: Not on file   Number of children: Not on file   Years of education: Not on file   Highest education level: Not on file  Occupational History   Not on file  Tobacco Use   Smoking status: Former    Current packs/day: 1.00    Types: Cigarettes    Passive exposure: Current   Smokeless tobacco: Never   Tobacco comments:    Pt has not smoked since end of May 2024. ARJ 05/19/23  Vaping Use   Vaping status: Never Used  Substance and Sexual Activity   Alcohol use: Not Currently    Comment: occassional   Drug use: No   Sexual activity: Yes    Partners: Male    Birth control/protection: Surgical    Comment: Hyst., INTERCOURSE AGE 61, SEXUAL PARTNERS MORE THAN 5  Other Topics Concern   Not on file  Social History Narrative   Not on file   Social Drivers of Health   Financial Resource Strain: Low Risk  (05/25/2024)   Overall Financial Resource Strain (CARDIA)    Difficulty of Paying Living Expenses: Not hard at all  Food Insecurity: No Food Insecurity (05/25/2024)   Hunger Vital Sign    Worried About Running Out of Food in the Last Year: Never true    Ran Out of Food in the Last Year: Never true  Transportation Needs: No Transportation Needs (05/25/2024)   PRAPARE - Administrator, Civil Service (Medical): No    Lack of Transportation (Non-Medical): No  Physical Activity: Insufficiently Active (05/25/2024)   Exercise Vital Sign    Days of Exercise per Week: 3 days    Minutes of Exercise per Session: 30 min  Stress: No Stress Concern Present (05/25/2024)    [employer] of Occupational Health - Occupational Stress Questionnaire    Feeling of Stress: Only a little  Social Connections: Moderately Isolated (05/25/2024)   Social Connection and Isolation Panel    Frequency of Communication with Friends and Family: More  than three times a week    Frequency of Social Gatherings with Friends and Family: More than three times a week    Attends Religious Services: More than 4 times per year    Active Member of [employer] or Organizations: No    Attends Banker Meetings: Never    Marital Status: Never married  Intimate Partner Violence: Not At Risk (05/25/2024)   Humiliation, Afraid, Rape, and Kick questionnaire    Fear of Current or Ex-Partner: No    Emotionally Abused: No    Physically Abused: No    Sexually Abused: No    Family History  Problem Relation Age of Onset   Diabetes Mother    Hypertension Mother    Breast cancer Mother        Age Late 51's   Hypertension Maternal Grandfather     Review of Systems:  As stated in the HPI and otherwise negative.   LMP 01/23/2012   Physical Examination: General: Well developed, well nourished, NAD  HEENT: OP clear, mucus membranes moist  SKIN: warm, dry. No rashes. Neuro: No focal deficits  Musculoskeletal: Muscle strength 5/5 all ext  Psychiatric: Mood and affect normal  Neck: No JVD, no carotid bruits, no thyromegaly, no lymphadenopathy.  Lungs:Clear bilaterally, no wheezes, rhonci, crackles Cardiovascular: Regular rate and rhythm. No murmurs, gallops or rubs. Abdomen:Soft. Bowel sounds present. Non-tender.  Extremities: No lower extremity edema. Pulses are 2 + in the bilateral DP/PT.  EKG:  EKG is not *** ordered today. The ekg ordered today demonstrates   Recent Labs: 02/23/2024: BUN 14; Creatinine, Ser 0.92; Potassium 4.0; Sodium 144   Lipid Panel    Component Value Date/Time   CHOL 126 04/23/2023 1142   TRIG 82 04/23/2023 1142   HDL 67 04/23/2023 1142   CHOLHDL 1.9  04/23/2023 1142   CHOLHDL 2.5 03/09/2023 1701   VLDL 21 03/09/2023 1701   LDLCALC 43 04/23/2023 1142     Wt Readings from Last 3 Encounters:  06/14/24 145 lb (65.8 kg)  05/25/24 144 lb (65.3 kg)  02/23/24 153 lb 12.8 oz (69.8 kg)    Assessment and Plan:   1. CAD without angina: No chest pain. Continue ASA. Stop Brilinta . Continue statin.    2. Chronic diastolic CHF: Wt is stable. No volume overload on exam. Continue Lasix .   3. Hyperlipidemia: LDL ***. Continue statin  4. HTN: BP is well controlled. Continue Norvasc  and Losartan .   5. Tobacco abuse: She has stopped smoking  Labs/ tests ordered today include:  No orders of the defined types were placed in this encounter.  Disposition:   F/U with me in 12 months   Signed, Lonni Cash, MD, [facility] 06/14/2024 3:43 PM    [facility] [address] [place], [place], [place]  [address] Phone: [phone]; Fax: [phone]

## 2024-06-14 NOTE — Patient Instructions (Signed)
 We reviewed your CT scan of the chest today.  Good news.  No significant abnormality seen.  You need a repeat lung cancer screening CT chest in April 2026. We will hold off on scheduling bronchodilator inhalers today. Keep your albuterol  available in case she needed for shortness of breath, chest tightness, wheezing. Follow-up with Dr. Shelah in 1 year or sooner if you have any problems.

## 2024-06-14 NOTE — Assessment & Plan Note (Signed)
 She precipitates an lung cancer screening program.  Her most recent scan from April was reassuring.  She needs a repeat in April 2026.

## 2024-06-15 ENCOUNTER — Encounter: Payer: Self-pay | Admitting: Cardiovascular Disease

## 2024-06-15 ENCOUNTER — Ambulatory Visit: Attending: Cardiovascular Disease | Admitting: Cardiovascular Disease

## 2024-06-15 VITALS — BP 144/49 | HR 91 | Resp 16 | Ht 60.0 in | Wt 150.2 lb

## 2024-06-15 DIAGNOSIS — I5032 Chronic diastolic (congestive) heart failure: Secondary | ICD-10-CM | POA: Diagnosis not present

## 2024-06-15 DIAGNOSIS — I10 Essential (primary) hypertension: Secondary | ICD-10-CM | POA: Diagnosis not present

## 2024-06-15 DIAGNOSIS — E7800 Pure hypercholesterolemia, unspecified: Secondary | ICD-10-CM | POA: Diagnosis not present

## 2024-06-15 DIAGNOSIS — I2510 Atherosclerotic heart disease of native coronary artery without angina pectoris: Secondary | ICD-10-CM

## 2024-06-15 MED ORDER — AMLODIPINE BESYLATE 10 MG PO TABS: 10.0000 mg | ORAL_TABLET | Freq: Every day | ORAL | 3 refills | Status: DC

## 2024-06-15 NOTE — Patient Instructions (Signed)
 Medication Instructions:  Your physician has recommended you make the following change in your medication:  1.) increase amlodipine  to 10 mg - one tablet daily 2.) stop Brilinta   *If you need a refill on your cardiac medications before your next appointment, please call your pharmacy*  Lab Work: none   Testing/Procedures: none  Follow-Up: At [facility], you and your health needs are our priority.  As part of our continuing mission to provide you with exceptional heart care, our providers are all part of one team.  This team includes your primary Cardiologist (physician) and Advanced Practice Providers or APPs (Physician Assistants and Nurse Practitioners) who all work together to provide you with the care you need, when you need it.  Your next appointment:   12 month(s)  Provider:   Lonni Cash, MD

## 2024-07-18 ENCOUNTER — Ambulatory Visit (INDEPENDENT_AMBULATORY_CARE_PROVIDER_SITE_OTHER): Admitting: Family

## 2024-07-18 ENCOUNTER — Encounter: Payer: Self-pay | Admitting: Family

## 2024-07-18 VITALS — BP 126/87 | HR 67 | Temp 98.5°F | Resp 16 | Ht 60.0 in | Wt 148.8 lb

## 2024-07-18 DIAGNOSIS — I10 Essential (primary) hypertension: Secondary | ICD-10-CM

## 2024-07-18 DIAGNOSIS — J3089 Other allergic rhinitis: Secondary | ICD-10-CM

## 2024-07-18 DIAGNOSIS — M792 Neuralgia and neuritis, unspecified: Secondary | ICD-10-CM | POA: Diagnosis not present

## 2024-07-18 DIAGNOSIS — M62838 Other muscle spasm: Secondary | ICD-10-CM

## 2024-07-18 MED ORDER — GABAPENTIN 300 MG PO CAPS: 300.0000 mg | ORAL_CAPSULE | Freq: Two times a day (BID) | ORAL | 0 refills | Status: DC

## 2024-07-18 MED ORDER — CYCLOBENZAPRINE HCL 5 MG PO TABS: 5.0000 mg | ORAL_TABLET | Freq: Every day | ORAL | 0 refills | Status: DC

## 2024-07-18 MED ORDER — FLUTICASONE PROPIONATE 50 MCG/ACT NA SUSP: 2.0000 | Freq: Every day | NASAL | 2 refills | Status: DC

## 2024-07-18 MED ORDER — AMLODIPINE BESYLATE 10 MG PO TABS: 10.0000 mg | ORAL_TABLET | Freq: Every day | ORAL | 0 refills | Status: DC

## 2024-07-18 MED ORDER — LOSARTAN POTASSIUM 50 MG PO TABS: 50.0000 mg | ORAL_TABLET | Freq: Every day | ORAL | 0 refills | Status: DC

## 2024-07-18 NOTE — Progress Notes (Signed)
 Patient ID: Brittany Duffy, female    DOB: 1955-12-04  MRN: [id number]  CC: Chronic Conditions Follow-Up  Subjective: Brittany Duffy is a 68 y.o. female who presents for chronic conditions follow-up.  Her concerns today include:  - Doing well on Amlodipine  and Losartan , no issues/concerns. She does not complain of red flag symptoms such as but not limited to chest pain, shortness of breath, worst headache of life, nausea/vomiting.  - Doing well on Gabapentin , no issues/concerns.  - Doing well on Cyclobenzaprine , no issues/concerns. - Doing well on Fluticasone  nasal spray, no issues/concerns.  Patient Active Problem List   Diagnosis Date Noted   CAD (coronary artery disease) 06/21/2023   Essential hypertension 06/21/2023   COPD (chronic obstructive pulmonary disease) (HCC) 05/19/2023   (HFpEF) heart failure with preserved ejection fraction (HCC) 03/24/2023   Other secondary pulmonary hypertension (HCC) 03/24/2023   Hx of Inf STEMI in 02/2023 03/09/2023   Prediabetes 05/10/2021   Hyperlipidemia 05/10/2021   History of total abdominal hysterectomy 08/27/2020   Episodic tobacco dependence 12/14/2015   Chronic active hepatitis C (HCC) 12/14/2015   Hepatitis, viral 09/25/2015   Allergic urticaria 09/25/2015   Ovarian abscess    Vaginal intraepithelial neoplasia III (VAIN III)    Osteopenia      Current Outpatient Medications on File Prior to Visit  Medication Sig Dispense Refill   albuterol  (VENTOLIN  HFA) 108 (90 Base) MCG/ACT inhaler Inhale 2 puffs into the lungs every 4 (four) hours as needed for wheezing or shortness of breath. 8 g 3   ALPRAZolam (XANAX) 0.5 MG tablet Take 0.5 mg by mouth 2 (two) times daily.  1   Ascorbic Acid (VITAMIN C PO) Take 1 tablet by mouth daily.      aspirin  EC 81 MG tablet Take 1 tablet (81 mg total) by mouth daily. Swallow whole.     CALCIUM  PO Take 1 tablet by mouth daily.      cetirizine (ZYRTEC) 10 MG tablet Take 10 mg by mouth  daily.     Cholecalciferol (VITAMIN D PO) Take 1 tablet by mouth daily.     Cyanocobalamin (VITAMIN B 12 PO) Take 1 tablet by mouth every other day.     fish oil-omega-3 fatty acids 1000 MG capsule Take 2 g by mouth daily.     furosemide  (LASIX ) 20 MG tablet Take 1 tablet (20 mg total) by mouth 2 (two) times daily. 180 tablet 3   Multiple Vitamin (MULTIVITAMIN PO) Take 1 tablet by mouth daily. Reported on 04/28/2016     nitroGLYCERIN  (NITROSTAT ) 0.4 MG SL tablet Place 1 tablet (0.4 mg total) under the tongue every 5 (five) minutes as needed for chest pain. 25 tablet 3   potassium chloride  SA (KLOR-CON  M) 20 MEQ tablet Take 1 tablet (20 mEq total) by mouth daily. 90 tablet 3   rosuvastatin  (CRESTOR ) 40 MG tablet Take 1 tablet (40 mg total) by mouth daily. 90 tablet 3   VITAMIN A PO Take 1 tablet by mouth every other day. (Occasionally)     VITAMIN E PO Take 1 tablet by mouth every other day.  (Occasionally)     No current facility-administered medications on file prior to visit.    No Known Allergies  Social History   Socioeconomic History   Marital status: Single    Spouse name: Not on file   Number of children: Not on file   Years of education: Not on file   Highest education level: Not on file  Occupational History   Not on file  Tobacco Use   Smoking status: Former    Current packs/day: 1.00    Types: Cigarettes    Passive exposure: Current   Smokeless tobacco: Never   Tobacco comments:    Pt has not smoked since end of May 2024. ARJ 05/19/23  Vaping Use   Vaping status: Never Used  Substance and Sexual Activity   Alcohol use: Not Currently    Comment: occassional   Drug use: No   Sexual activity: Yes    Partners: Male    Birth control/protection: Surgical    Comment: Hyst., INTERCOURSE AGE 68, SEXUAL PARTNERS MORE THAN 5  Other Topics Concern   Not on file  Social History Narrative   Not on file   Social Drivers of Health   Financial Resource Strain: Low Risk   (05/25/2024)   Overall Financial Resource Strain (CARDIA)    Difficulty of Paying Living Expenses: Not hard at all  Food Insecurity: No Food Insecurity (05/25/2024)   Hunger Vital Sign    Worried About Running Out of Food in the Last Year: Never true    Ran Out of Food in the Last Year: Never true  Transportation Needs: No Transportation Needs (05/25/2024)   PRAPARE - Administrator, Civil Service (Medical): No    Lack of Transportation (Non-Medical): No  Physical Activity: Insufficiently Active (05/25/2024)   Exercise Vital Sign    Days of Exercise per Week: 3 days    Minutes of Exercise per Session: 30 min  Stress: No Stress Concern Present (05/25/2024)   [employer] of Occupational Health - Occupational Stress Questionnaire    Feeling of Stress: Only a little  Social Connections: Moderately Isolated (05/25/2024)   Social Connection and Isolation Panel    Frequency of Communication with Friends and Family: More than three times a week    Frequency of Social Gatherings with Friends and Family: More than three times a week    Attends Religious Services: More than 4 times per year    Active Member of [employer] or Organizations: No    Attends Banker Meetings: Never    Marital Status: Never married  Intimate Partner Violence: Not At Risk (05/25/2024)   Humiliation, Afraid, Rape, and Kick questionnaire    Fear of Current or Ex-Partner: No    Emotionally Abused: No    Physically Abused: No    Sexually Abused: No    Family History  Problem Relation Age of Onset   Diabetes Mother    Hypertension Mother    Breast cancer Mother        Age Late 82's   Heart failure Father    Hypertension Maternal Grandfather     Past Surgical History:  Procedure Laterality Date   CARPAL TUNNEL RELEASE     CO2 LSAER OF VAGINA  07, 11   CORONARY/GRAFT ACUTE MI REVASCULARIZATION N/A 03/09/2023   Procedure: Coronary/Graft Acute MI Revascularization;  Surgeon: Verlin Lonni BIRCH, MD;  Location: [facility];  Service: Cardiovascular;  Laterality: N/A;   DIVERTICULUM OF THE BLADDER     LEFT HEART CATH AND CORONARY ANGIOGRAPHY N/A 03/09/2023   Procedure: LEFT HEART CATH AND CORONARY ANGIOGRAPHY;  Surgeon: Verlin Lonni BIRCH, MD;  Location: [facility];  Service: Cardiovascular;  Laterality: N/A;   OOPHORECTOMY     BSO   TOTAL ABDOMINAL HYSTERECTOMY  2004   BSO    ROS: Review of Systems Negative except  as stated above  PHYSICAL EXAM: BP 126/87   Pulse 67   Temp 98.5 F (36.9 C) (Oral)   Resp 16   Ht 5' (1.524 m)   Wt 148 lb 12.8 oz (67.5 kg)   LMP 01/23/2012   SpO2 96%   BMI 29.06 kg/m   Physical Exam HENT:     Head: Normocephalic and atraumatic.     Nose: Nose normal.     Mouth/Throat:     Mouth: Mucous membranes are moist.     Pharynx: Oropharynx is clear.  Eyes:     Extraocular Movements: Extraocular movements intact.     Conjunctiva/sclera: Conjunctivae normal.     Pupils: Pupils are equal, round, and reactive to light.  Cardiovascular:     Rate and Rhythm: Normal rate and regular rhythm.     Pulses: Normal pulses.     Heart sounds: Normal heart sounds.  Pulmonary:     Effort: Pulmonary effort is normal.     Breath sounds: Normal breath sounds.  Musculoskeletal:        General: Normal range of motion.     Cervical back: Normal range of motion and neck supple.  Neurological:     General: No focal deficit present.     Mental Status: She is alert and oriented to person, place, and time.  Psychiatric:        Mood and Affect: Mood normal.        Behavior: Behavior normal.     ASSESSMENT AND PLAN: 1. Primary hypertension (Primary) - Continue Amlodipine  and Losartan  as prescribed.  - Counseled on blood pressure goal of less than 130/80, low-sodium, DASH diet, medication compliance, and 150 minutes of moderate intensity exercise per week as tolerated. Counseled on medication adherence and adverse effects. -  Follow-up with primary provider in 3 months or sooner if needed.  - amLODipine  (NORVASC ) 10 MG tablet; Take 1 tablet (10 mg total) by mouth daily.  Dispense: 90 tablet; Refill: 0 - losartan  (COZAAR ) 50 MG tablet; Take 1 tablet (50 mg total) by mouth daily.  Dispense: 90 tablet; Refill: 0  2. Neuropathic pain - Continue Gabapentin  as prescribed. Counseled on medication adherence/adverse effects.  - Follow-up with primary provider in 3 months or sooner if needed.  - gabapentin  (NEURONTIN ) 300 MG capsule; Take 1 capsule (300 mg total) by mouth 2 (two) times daily.  Dispense: 180 capsule; Refill: 0  3. Muscle spasm - Continue Cyclobenzaprine  as prescribed. Counseled on medication adherence/adverse effects.  - Follow-up with primary provider in 3 months or sooner if needed.  - cyclobenzaprine  (FLEXERIL ) 5 MG tablet; Take 1 tablet (5 mg total) by mouth at bedtime.  Dispense: 90 tablet; Refill: 0  4. Perennial allergic rhinitis - Continue Fluticasone  as prescribed. Counseled on medication adherence/adverse effects.  - Follow-up with primary provider in 3 months or sooner if needed.  - fluticasone  (FLONASE ) 50 MCG/ACT nasal spray; Place 2 sprays into both nostrils daily.  Dispense: 16 g; Refill: 2  Patient was given the opportunity to ask questions.  Patient verbalized understanding of the plan and was able to repeat key elements of the plan. Patient was given clear instructions to go to Emergency Department or return to medical center if symptoms don't improve, worsen, or new problems develop.The patient verbalized understanding.   Requested Prescriptions   Signed Prescriptions Disp Refills   amLODipine  (NORVASC ) 10 MG tablet 90 tablet 0    Sig: Take 1 tablet (10 mg total) by mouth daily.  losartan  (COZAAR ) 50 MG tablet 90 tablet 0    Sig: Take 1 tablet (50 mg total) by mouth daily.   gabapentin  (NEURONTIN ) 300 MG capsule 180 capsule 0    Sig: Take 1 capsule (300 mg total) by mouth 2 (two)  times daily.   cyclobenzaprine  (FLEXERIL ) 5 MG tablet 90 tablet 0    Sig: Take 1 tablet (5 mg total) by mouth at bedtime.   fluticasone  (FLONASE ) 50 MCG/ACT nasal spray 16 g 2    Sig: Place 2 sprays into both nostrils daily.    Return in about 3 months (around 10/17/2024) for Follow-Up or next available chronic conditions.  Greig JINNY Drones, NP

## 2024-07-25 ENCOUNTER — Other Ambulatory Visit: Payer: Self-pay | Admitting: Cardiovascular Disease

## 2024-07-25 ENCOUNTER — Other Ambulatory Visit (HOSPITAL_COMMUNITY): Payer: Self-pay

## 2024-07-25 MED ORDER — FUROSEMIDE 20 MG PO TABS
20.0000 mg | ORAL_TABLET | Freq: Two times a day (BID) | ORAL | 3 refills | Status: AC
  Filled 2024-07-25: qty 180, 90d supply, fill #0
  Filled 2024-10-18: qty 180, 90d supply, fill #1

## 2024-07-25 MED ORDER — POTASSIUM CHLORIDE CRYS ER 20 MEQ PO TBCR
20.0000 meq | EXTENDED_RELEASE_TABLET | Freq: Every day | ORAL | 3 refills | Status: AC
  Filled 2024-07-25: qty 90, 90d supply, fill #0
  Filled 2024-10-18: qty 90, 90d supply, fill #1

## 2024-07-25 MED ORDER — ROSUVASTATIN CALCIUM 40 MG PO TABS
40.0000 mg | ORAL_TABLET | Freq: Every day | ORAL | 3 refills | Status: AC
  Filled 2024-07-25: qty 90, 90d supply, fill #0
  Filled 2024-10-18: qty 90, 90d supply, fill #1

## 2024-07-27 ENCOUNTER — Other Ambulatory Visit (HOSPITAL_COMMUNITY): Payer: Self-pay

## 2024-09-01 NOTE — Progress Notes (Signed)
 Brittany Duffy                                          MRN: [id number]   09/01/2024   The [facility] Quality Team Specialist reviewed this patient medical record for the purposes of chart review for care gap closure. The following were reviewed: chart review for care gap closure-controlling blood pressure.    [facility] Quality Team

## 2024-09-13 ENCOUNTER — Other Ambulatory Visit: Payer: Self-pay | Admitting: Emergency Medicine

## 2024-09-13 ENCOUNTER — Other Ambulatory Visit: Payer: Self-pay | Admitting: Physician Assistant

## 2024-09-13 DIAGNOSIS — I10 Essential (primary) hypertension: Secondary | ICD-10-CM

## 2024-10-11 ENCOUNTER — Other Ambulatory Visit: Payer: Self-pay | Admitting: Family

## 2024-10-11 DIAGNOSIS — I10 Essential (primary) hypertension: Secondary | ICD-10-CM

## 2024-10-12 ENCOUNTER — Other Ambulatory Visit: Payer: Self-pay | Admitting: Emergency Medicine

## 2024-10-12 DIAGNOSIS — J3089 Other allergic rhinitis: Secondary | ICD-10-CM

## 2024-10-12 NOTE — Telephone Encounter (Signed)
 Refill request

## 2024-10-13 MED ORDER — FLUTICASONE PROPIONATE 50 MCG/ACT NA SUSP: 2.0000 | Freq: Every day | NASAL | 2 refills | Status: DC

## 2024-10-13 NOTE — Telephone Encounter (Signed)
 Complete

## 2024-10-17 ENCOUNTER — Ambulatory Visit (INDEPENDENT_AMBULATORY_CARE_PROVIDER_SITE_OTHER): Admitting: Family

## 2024-10-17 ENCOUNTER — Encounter: Payer: Self-pay | Admitting: Family

## 2024-10-17 VITALS — BP 144/82 | HR 82 | Temp 98.6°F | Resp 16 | Ht 60.0 in | Wt 144.0 lb

## 2024-10-17 DIAGNOSIS — M62838 Other muscle spasm: Secondary | ICD-10-CM

## 2024-10-17 DIAGNOSIS — M792 Neuralgia and neuritis, unspecified: Secondary | ICD-10-CM

## 2024-10-17 DIAGNOSIS — R7303 Prediabetes: Secondary | ICD-10-CM

## 2024-10-17 DIAGNOSIS — J3089 Other allergic rhinitis: Secondary | ICD-10-CM

## 2024-10-17 DIAGNOSIS — I10 Essential (primary) hypertension: Secondary | ICD-10-CM

## 2024-10-17 DIAGNOSIS — J329 Chronic sinusitis, unspecified: Secondary | ICD-10-CM

## 2024-10-17 MED ORDER — FLUTICASONE PROPIONATE 50 MCG/ACT NA SUSP: 2.0000 | Freq: Every day | NASAL | 2 refills | Status: AC

## 2024-10-17 MED ORDER — LOSARTAN POTASSIUM 50 MG PO TABS: 50.0000 mg | ORAL_TABLET | Freq: Every day | ORAL | 0 refills | Status: DC

## 2024-10-17 MED ORDER — AMLODIPINE BESYLATE 10 MG PO TABS: 10.0000 mg | ORAL_TABLET | Freq: Every day | ORAL | 0 refills | Status: DC

## 2024-10-17 MED ORDER — CYCLOBENZAPRINE HCL 5 MG PO TABS: 5.0000 mg | ORAL_TABLET | Freq: Every day | ORAL | 0 refills | Status: AC

## 2024-10-17 MED ORDER — AMOXICILLIN-POT CLAVULANATE 875-125 MG PO TABS: 1.0000 | ORAL_TABLET | Freq: Two times a day (BID) | ORAL | 0 refills | Status: DC

## 2024-10-17 MED ORDER — GABAPENTIN 300 MG PO CAPS: 300.0000 mg | ORAL_CAPSULE | Freq: Two times a day (BID) | ORAL | 0 refills | Status: AC

## 2024-10-17 NOTE — Progress Notes (Signed)
 Patient ID: Brittany Duffy, female    DOB: Aug 29, 1956  MRN: [id number]  CC: Chronic Conditions Follow-Up  Subjective: Brittany Duffy is a 68 y.o. female who presents for chronic conditions follow-up.   Her concerns today include:  - Doing well on Amlodipine  and Losartan , no issues/concerns. States she was recently taking her brother's Amlodipine  5 mg instead of her Amlodipine  10 mg. She does not complain of red flag symptoms such as but not limited to chest pain, shortness of breath, worst headache of life, nausea/vomiting.  - Prediabetes follow-up.  - Doing well on Gabapentin , no issues/concerns.  - Doing well on Cyclobenzaprine , no issues/concerns.  - Doing well on Fluticasone , no issues/concerns.  - States she needs an antibiotic for sinus infection.   Patient Active Problem List   Diagnosis Date Noted   CAD (coronary artery disease) 06/21/2023   Essential hypertension 06/21/2023   COPD (chronic obstructive pulmonary disease) (HCC) 05/19/2023   (HFpEF) heart failure with preserved ejection fraction (HCC) 03/24/2023   Other secondary pulmonary hypertension (HCC) 03/24/2023   Hx of Inf STEMI in 02/2023 03/09/2023   Prediabetes 05/10/2021   Hyperlipidemia 05/10/2021   History of total abdominal hysterectomy 08/27/2020   Episodic tobacco dependence 12/14/2015   Chronic active hepatitis C (HCC) 12/14/2015   Hepatitis, viral 09/25/2015   Allergic urticaria 09/25/2015   Ovarian abscess    Vaginal intraepithelial neoplasia III (VAIN III)    Osteopenia      Current Outpatient Medications on File Prior to Visit  Medication Sig Dispense Refill   albuterol  (VENTOLIN  HFA) 108 (90 Base) MCG/ACT inhaler INHALE 2 PUFFS INTO THE LUNGS EVERY 4 HOURS AS NEEDED FOR WHEEZING OR SHORTNESS OF BREATH 6.7 g 11   ALPRAZolam (XANAX) 0.5 MG tablet Take 0.5 mg by mouth 2 (two) times daily.  1   Ascorbic Acid (VITAMIN C PO) Take 1 tablet by mouth daily.      aspirin  EC 81 MG tablet Take  1 tablet (81 mg total) by mouth daily. Swallow whole.     CALCIUM  PO Take 1 tablet by mouth daily.      cetirizine (ZYRTEC) 10 MG tablet Take 10 mg by mouth daily.     Cholecalciferol (VITAMIN D PO) Take 1 tablet by mouth daily.     Cyanocobalamin (VITAMIN B 12 PO) Take 1 tablet by mouth every other day.     fish oil-omega-3 fatty acids 1000 MG capsule Take 2 g by mouth daily.     furosemide  (LASIX ) 20 MG tablet Take 1 tablet (20 mg total) by mouth 2 (two) times daily. 180 tablet 3   Multiple Vitamin (MULTIVITAMIN PO) Take 1 tablet by mouth daily. Reported on 04/28/2016     nitroGLYCERIN  (NITROSTAT ) 0.4 MG SL tablet Place 1 tablet (0.4 mg total) under the tongue every 5 (five) minutes as needed for chest pain. 25 tablet 3   potassium chloride  SA (KLOR-CON  M) 20 MEQ tablet Take 1 tablet (20 mEq total) by mouth daily. 90 tablet 3   rosuvastatin  (CRESTOR ) 40 MG tablet Take 1 tablet (40 mg total) by mouth daily. 90 tablet 3   VITAMIN A PO Take 1 tablet by mouth every other day. (Occasionally)     VITAMIN E PO Take 1 tablet by mouth every other day.  (Occasionally)     No current facility-administered medications on file prior to visit.    No Known Allergies  Social History   Socioeconomic History   Marital status: Single  Spouse name: Not on file   Number of children: Not on file   Years of education: Not on file   Highest education level: Not on file  Occupational History   Not on file  Tobacco Use   Smoking status: Former    Current packs/day: 1.00    Types: Cigarettes    Passive exposure: Current   Smokeless tobacco: Never   Tobacco comments:    Pt has not smoked since end of May 2024. ARJ 05/19/23  Vaping Use   Vaping status: Never Used  Substance and Sexual Activity   Alcohol use: Not Currently    Comment: occassional   Drug use: No   Sexual activity: Yes    Partners: Male    Birth control/protection: Surgical    Comment: Hyst., INTERCOURSE AGE 52, SEXUAL PARTNERS MORE  THAN 5  Other Topics Concern   Not on file  Social History Narrative   Not on file   Social Drivers of Health   Financial Resource Strain: Low Risk  (05/25/2024)   Overall Financial Resource Strain (CARDIA)    Difficulty of Paying Living Expenses: Not hard at all  Food Insecurity: No Food Insecurity (05/25/2024)   Hunger Vital Sign    Worried About Running Out of Food in the Last Year: Never true    Ran Out of Food in the Last Year: Never true  Transportation Needs: No Transportation Needs (05/25/2024)   PRAPARE - Administrator, Civil Service (Medical): No    Lack of Transportation (Non-Medical): No  Physical Activity: Insufficiently Active (05/25/2024)   Exercise Vital Sign    Days of Exercise per Week: 3 days    Minutes of Exercise per Session: 30 min  Stress: No Stress Concern Present (05/25/2024)   [employer] of Occupational Health - Occupational Stress Questionnaire    Feeling of Stress: Only a little  Social Connections: Moderately Isolated (05/25/2024)   Social Connection and Isolation Panel    Frequency of Communication with Friends and Family: More than three times a week    Frequency of Social Gatherings with Friends and Family: More than three times a week    Attends Religious Services: More than 4 times per year    Active Member of [employer] or Organizations: No    Attends Banker Meetings: Never    Marital Status: Never married  Intimate Partner Violence: Not At Risk (05/25/2024)   Humiliation, Afraid, Rape, and Kick questionnaire    Fear of Current or Ex-Partner: No    Emotionally Abused: No    Physically Abused: No    Sexually Abused: No    Family History  Problem Relation Age of Onset   Diabetes Mother    Hypertension Mother    Breast cancer Mother        Age Late 67's   Heart failure Father    Hypertension Maternal Grandfather     Past Surgical History:  Procedure Laterality Date   CARPAL TUNNEL RELEASE     CO2 LSAER OF  VAGINA  07, 11   CORONARY/GRAFT ACUTE MI REVASCULARIZATION N/A 03/09/2023   Procedure: Coronary/Graft Acute MI Revascularization;  Surgeon: Verlin Lonni BIRCH, MD;  Location: [facility];  Service: Cardiovascular;  Laterality: N/A;   DIVERTICULUM OF THE BLADDER     LEFT HEART CATH AND CORONARY ANGIOGRAPHY N/A 03/09/2023   Procedure: LEFT HEART CATH AND CORONARY ANGIOGRAPHY;  Surgeon: Verlin Lonni BIRCH, MD;  Location: [facility];  Service: Cardiovascular;  Laterality: N/A;   OOPHORECTOMY     BSO   TOTAL ABDOMINAL HYSTERECTOMY  2004   BSO    ROS: Review of Systems Negative except as stated above  PHYSICAL EXAM: BP (!) 144/82   Pulse 82   Temp 98.6 F (37 C) (Oral)   Resp 16   Ht 5' (1.524 m)   Wt 144 lb (65.3 kg)   LMP 01/23/2012   SpO2 97%   BMI 28.12 kg/m   Physical Exam HENT:     Head: Normocephalic and atraumatic.     Right Ear: Tympanic membrane, ear canal and external ear normal.     Left Ear: Tympanic membrane, ear canal and external ear normal.     Nose: Nose normal.     Mouth/Throat:     Mouth: Mucous membranes are moist.     Pharynx: Oropharynx is clear.  Eyes:     Extraocular Movements: Extraocular movements intact.     Conjunctiva/sclera: Conjunctivae normal.     Pupils: Pupils are equal, round, and reactive to light.  Cardiovascular:     Rate and Rhythm: Normal rate and regular rhythm.     Pulses: Normal pulses.     Heart sounds: Normal heart sounds.  Pulmonary:     Effort: Pulmonary effort is normal.     Breath sounds: Normal breath sounds.  Musculoskeletal:        General: Normal range of motion.     Cervical back: Normal range of motion and neck supple.  Neurological:     General: No focal deficit present.     Mental Status: She is alert and oriented to person, place, and time.  Psychiatric:        Mood and Affect: Mood normal.        Behavior: Behavior normal.     ASSESSMENT AND PLAN: 1. Primary hypertension  (Primary) - Blood pressure not at goal during today's visit. Patient asymptomatic without chest pressure, chest pain, palpitations, shortness of breath, worst headache of life, and any additional red flag symptoms. - Continue Losartan  as prescribed.  - Resume Amlodipine  as prescribed.  - Routine screening.  - Counseled on blood pressure goal of less than 130/80, low-sodium, DASH diet, medication compliance, and 150 minutes of moderate intensity exercise per week as tolerated. Counseled on medication adherence and adverse effects. - Follow-up with primary provider in 4 weeks or sooner if needed.  - amLODipine  (NORVASC ) 10 MG tablet; Take 1 tablet (10 mg total) by mouth daily.  Dispense: 90 tablet; Refill: 0 - losartan  (COZAAR ) 50 MG tablet; Take 1 tablet (50 mg total) by mouth daily.  Dispense: 90 tablet; Refill: 0 - Basic Metabolic Panel  2. Prediabetes - Routine screening.  - Hemoglobin A1c  3. Neuropathic pain - Continue Gabapentin  as prescribed. Counseled on medication adherence/adverse effects.  - Follow-up with primary provider in 3 months or sooner if needed.  - gabapentin  (NEURONTIN ) 300 MG capsule; Take 1 capsule (300 mg total) by mouth 2 (two) times daily.  Dispense: 180 capsule; Refill: 0  4. Muscle spasm - Continue Cyclobenzaprine  as prescribed. Counseled on medication adherence/adverse effects.  - Follow-up with primary provider in 3 months or sooner if needed.  - cyclobenzaprine  (FLEXERIL ) 5 MG tablet; Take 1 tablet (5 mg total) by mouth at bedtime.  Dispense: 90 tablet; Refill: 0  5. Perennial allergic rhinitis - Continue Fluticasone  as prescribed. Counseled on medication adherence/adverse effects.  - Follow-up with primary provider in 3 months or sooner if needed.  -  fluticasone  (FLONASE ) 50 MCG/ACT nasal spray; Place 2 sprays into both nostrils daily.  Dispense: 16 g; Refill: 2  6. Sinusitis, unspecified chronicity, unspecified location - Patient declined respiratory  panel and additional screening.  - Amoxicillin -Clavulanate as prescribed. Counseled on medication adherence/adverse effects.  - Follow-up with primary provider as scheduled. - amoxicillin -clavulanate (AUGMENTIN ) 875-125 MG tablet; Take 1 tablet by mouth 2 (two) times daily.  Dispense: 20 tablet; Refill: 0   Patient was given the opportunity to ask questions.  Patient verbalized understanding of the plan and was able to repeat key elements of the plan. Patient was given clear instructions to go to Emergency Department or return to medical center if symptoms don't improve, worsen, or new problems develop.The patient verbalized understanding.   Orders Placed This Encounter  Procedures   Basic Metabolic Panel   Hemoglobin A1c     Requested Prescriptions   Signed Prescriptions Disp Refills   amLODipine  (NORVASC ) 10 MG tablet 90 tablet 0    Sig: Take 1 tablet (10 mg total) by mouth daily.   losartan  (COZAAR ) 50 MG tablet 90 tablet 0    Sig: Take 1 tablet (50 mg total) by mouth daily.   gabapentin  (NEURONTIN ) 300 MG capsule 180 capsule 0    Sig: Take 1 capsule (300 mg total) by mouth 2 (two) times daily.   cyclobenzaprine  (FLEXERIL ) 5 MG tablet 90 tablet 0    Sig: Take 1 tablet (5 mg total) by mouth at bedtime.   fluticasone  (FLONASE ) 50 MCG/ACT nasal spray 16 g 2    Sig: Place 2 sprays into both nostrils daily.   amoxicillin -clavulanate (AUGMENTIN ) 875-125 MG tablet 20 tablet 0    Sig: Take 1 tablet by mouth 2 (two) times daily.    Return in about 4 weeks (around 11/14/2024) for Follow-Up or next available chronic conditions.  Greig JINNY Chute, NP

## 2024-10-17 NOTE — Progress Notes (Signed)
 3 month follow, patient having sinus drainage, medication refill

## 2024-10-18 ENCOUNTER — Ambulatory Visit: Payer: Self-pay | Admitting: Family

## 2024-10-18 LAB — BASIC METABOLIC PANEL WITH GFR
BUN/Creatinine Ratio: 12 (ref 12–28)
BUN: 9 mg/dL (ref 8–27)
CO2: 22 mmol/L (ref 20–29)
Calcium: 9.7 mg/dL (ref 8.7–10.3)
Chloride: 106 mmol/L (ref 96–106)
Creatinine, Ser: 0.77 mg/dL (ref 0.57–1.00)
Glucose: 87 mg/dL (ref 70–99)
Potassium: 4 mmol/L (ref 3.5–5.2)
Sodium: 145 mmol/L — ABNORMAL HIGH (ref 134–144)
eGFR: 83 mL/min/1.73 (ref >59)

## 2024-10-18 LAB — HEMOGLOBIN A1C
Est. average glucose Bld gHb Est-mCnc: 137 mg/dL
Hgb A1c MFr Bld: 6.4 % — ABNORMAL HIGH (ref 4.8–5.6)

## 2024-11-14 ENCOUNTER — Encounter: Payer: Self-pay | Admitting: Nurse Practitioner

## 2024-11-14 ENCOUNTER — Ambulatory Visit (INDEPENDENT_AMBULATORY_CARE_PROVIDER_SITE_OTHER): Payer: Medicare Other | Admitting: Nurse Practitioner

## 2024-11-14 VITALS — BP 116/70 | HR 100 | Ht 59.25 in | Wt 136.0 lb

## 2024-11-14 DIAGNOSIS — Z9189 Other specified personal risk factors, not elsewhere classified: Secondary | ICD-10-CM | POA: Diagnosis not present

## 2024-11-14 DIAGNOSIS — Z780 Asymptomatic menopausal state: Secondary | ICD-10-CM

## 2024-11-14 DIAGNOSIS — Z01419 Encounter for gynecological examination (general) (routine) without abnormal findings: Secondary | ICD-10-CM

## 2024-11-14 DIAGNOSIS — Z87411 Personal history of vaginal dysplasia: Secondary | ICD-10-CM

## 2024-11-14 DIAGNOSIS — M8589 Other specified disorders of bone density and structure, multiple sites: Secondary | ICD-10-CM

## 2024-11-14 NOTE — Progress Notes (Signed)
 "  Brittany Duffy 07/01/1956 [id number]   History:  69 y.o. G2P1011 presents for breast and pelvic exam. No GYN complaints. S/P 2004 TAH BSO for fibroids. 2011 VAIN 3 with CO2 laser, cryosurgery years ago. Osteopenia. Normal mammogram history. HTN, HLD, COPD managed by PCP. Quit smoking May 2024.  Gynecologic History Patient's last menstrual period was 01/23/2012.   Contraception: status post hysterectomy Sexually active: Yes  Health maintenance Last Pap (vaginal): 11/09/2023. Results were: Normal neg HPV, 3-year repeat Last mammogram: 05/11/2024. Results were: Normal Last colonoscopy: 2012. Results were: benign polyps Last Dexa: 10/20/2022. Results were: T-score -1.7, FRAX 4.1% / 1.1%  Past medical history, past surgical history, family history and social history were all reviewed and documented in the EPIC chart. Retired. Son. Mother diagnosed with breast cancer in late 49s. Cares for brother with COPD and schizophrenia.   ROS:  A ROS was performed and pertinent positives and negatives are included.  Exam:  Vitals:   11/14/24 1503  BP: 116/70  Pulse: 100  SpO2: 99%  Weight: 136 lb (61.7 kg)  Height: 4' 11.25 (1.505 m)       Body mass index is 27.24 kg/m.  General appearance:  Normal Thyroid :  Symmetrical, normal in size, without palpable masses or nodularity. Respiratory  Auscultation:  Clear without wheezing or rhonchi Cardiovascular  Auscultation:  Regular rate, without rubs, murmurs or gallops  Edema/varicosities:  Not grossly evident Abdominal  Soft,nontender, without masses, guarding or rebound.  Liver/spleen:  No organomegaly noted  Hernia:  None appreciated  Skin  Inspection:  Grossly normal   Breasts: Examined lying and sitting.   Right: Without masses, retractions, discharge or axillary adenopathy.   Left: Without masses, retractions, discharge or axillary adenopathy. Pelvic: External genitalia:  no lesions              Urethra:  normal  appearing urethra with no masses, tenderness or lesions              Bartholins and Skenes: normal                 Vagina: normal appearing vagina with normal color and discharge, no lesions. Atrophic changes              Cervix: absent Bimanual Exam:  Uterus: absent              Adnexa: no mass, fullness, tenderness              Rectovaginal: Deferred              Anus:  normal, no lesions  Brittany Duffy, CMA present as chaperone.   Assessment/Plan:  69 y.o. G2P1011 for breast and pelvic exam.  Encounter for breast and pelvic examination - Education provided on SBEs, importance of preventative screenings, current guidelines, high calcium  diet, regular exercise, and multivitamin daily.  Labs with PCP.   Postmenopausal - Stopped ERT a couple of years ago, doing fine. S/P TAH BSO.   Osteopenia of multiple sites - Plan: DG Bone Density. 10/2022 T-score -1.7 without elevated FRAX. Continue Vitamin D and Calcium  supplements and increase exercise.   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  Will repeat at 3-year interval per guidelines.   Screening for breast cancer - Normal mammogram history.  Continue annual screenings. Normal breast exam today.  Screening for colon cancer - 2012 colonoscopy. Overdue and encouraged to schedule.   Return  in about 1 year (around 11/14/2025) for B&P (high risk).     [name] [facility], 3:26 PM 11/14/2024 "

## 2024-11-15 ENCOUNTER — Ambulatory Visit: Admitting: Family

## 2024-11-15 ENCOUNTER — Encounter: Payer: Self-pay | Admitting: Family

## 2024-11-15 VITALS — BP 137/81 | HR 86 | Temp 98.8°F | Resp 16 | Ht 59.25 in | Wt 144.8 lb

## 2024-11-15 DIAGNOSIS — I10 Essential (primary) hypertension: Secondary | ICD-10-CM | POA: Diagnosis not present

## 2024-11-15 MED ORDER — LOSARTAN POTASSIUM 50 MG PO TABS: 50.0000 mg | ORAL_TABLET | Freq: Every day | ORAL | 0 refills | Status: AC

## 2024-11-15 MED ORDER — AMLODIPINE BESYLATE 10 MG PO TABS: 10.0000 mg | ORAL_TABLET | Freq: Every day | ORAL | 0 refills | Status: AC

## 2024-11-15 NOTE — Progress Notes (Signed)
 "   Patient ID: Brittany Duffy, female    DOB: 09-21-1956  MRN: [id number]  CC: Chronic Conditions Follow-Up  Subjective: Brittany Duffy is a 69 y.o. female who presents for chronic conditions follow-up.   Her concerns today include:  - Doing well on Losartan  and Amlodipine , no issues/concerns. She does not complain of red flag symptoms such as but not limited to chest pain, shortness of breath, worst headache of life, nausea/vomiting.   Patient Active Problem List   Diagnosis Date Noted   CAD (coronary artery disease) 06/21/2023   Essential hypertension 06/21/2023   COPD (chronic obstructive pulmonary disease) (HCC) 05/19/2023   (HFpEF) heart failure with preserved ejection fraction (HCC) 03/24/2023   Other secondary pulmonary hypertension (HCC) 03/24/2023   Hx of Inf STEMI in 02/2023 03/09/2023   Prediabetes 05/10/2021   Hyperlipidemia 05/10/2021   History of total abdominal hysterectomy 08/27/2020   Episodic tobacco dependence 12/14/2015   Chronic active hepatitis C (HCC) 12/14/2015   Hepatitis, viral 09/25/2015   Allergic urticaria 09/25/2015   Ovarian abscess    Vaginal intraepithelial neoplasia III (VAIN III)    Osteopenia      Medications Ordered Prior to Encounter[1]  Allergies[2]  Social History   Socioeconomic History   Marital status: Single    Spouse name: Not on file   Number of children: Not on file   Years of education: Not on file   Highest education level: Not on file  Occupational History   Not on file  Tobacco Use   Smoking status: Former    Current packs/day: 1.00    Types: Cigarettes    Passive exposure: Current   Smokeless tobacco: Never   Tobacco comments:    Pt has not smoked since end of May 2024. ARJ 05/19/23  Vaping Use   Vaping status: Never Used  Substance and Sexual Activity   Alcohol use: Yes    Comment: occassional   Drug use: No   Sexual activity: Not Currently    Partners: Male    Birth control/protection:  Surgical    Comment: Hyst., INTERCOURSE AGE 35, SEXUAL PARTNERS MORE THAN 5  Other Topics Concern   Not on file  Social History Narrative   Not on file   Social Drivers of Health   Tobacco Use: Medium Risk (11/15/2024)   Patient History    Smoking Tobacco Use: Former    Smokeless Tobacco Use: Never    Passive Exposure: Current  Physicist, Medical Strain: Low Risk (05/25/2024)   Overall Financial Resource Strain (CARDIA)    Difficulty of Paying Living Expenses: Not hard at all  Food Insecurity: No Food Insecurity (05/25/2024)   Epic    Worried About Programme Researcher, Broadcasting/film/video in the Last Year: Never true    Ran Out of Food in the Last Year: Never true  Transportation Needs: No Transportation Needs (05/25/2024)   Epic    Lack of Transportation (Medical): No    Lack of Transportation (Non-Medical): No  Physical Activity: Insufficiently Active (05/25/2024)   Exercise Vital Sign    Days of Exercise per Week: 3 days    Minutes of Exercise per Session: 30 min  Stress: No Stress Concern Present (05/25/2024)   [employer] of Occupational Health - Occupational Stress Questionnaire    Feeling of Stress: Only a little  Social Connections: Moderately Isolated (05/25/2024)   Social Connection and Isolation Panel    Frequency of Communication with Friends and Family: More than three times a week  Frequency of Social Gatherings with Friends and Family: More than three times a week    Attends Religious Services: More than 4 times per year    Active Member of Clubs or Organizations: No    Attends Banker Meetings: Never    Marital Status: Never married  Intimate Partner Violence: Not At Risk (05/25/2024)   Epic    Fear of Current or Ex-Partner: No    Emotionally Abused: No    Physically Abused: No    Sexually Abused: No  Depression (PHQ2-9): Low Risk (11/15/2024)   Depression (PHQ2-9)    PHQ-2 Score: 0  Alcohol Screen: Low Risk (05/25/2024)   Alcohol Screen    Last Alcohol  Screening Score (AUDIT): 0  Housing: Low Risk (05/25/2024)   Epic    Unable to Pay for Housing in the Last Year: No    Number of Times Moved in the Last Year: 0    Homeless in the Last Year: No  Utilities: Not At Risk (05/25/2024)   Epic    Threatened with loss of utilities: No  Health Literacy: Adequate Health Literacy (05/25/2024)   B1300 Health Literacy    Frequency of need for help with medical instructions: Never    Family History  Problem Relation Age of Onset   Diabetes Mother    Hypertension Mother    Breast cancer Mother        Age Late 74's   Heart failure Father    Hypertension Maternal Grandfather     Past Surgical History:  Procedure Laterality Date   CARPAL TUNNEL RELEASE     CO2 LSAER OF VAGINA  07, 11   CORONARY/GRAFT ACUTE MI REVASCULARIZATION N/A 03/09/2023   Procedure: Coronary/Graft Acute MI Revascularization;  Surgeon: Verlin Lonni BIRCH, MD;  Location: [facility];  Service: Cardiovascular;  Laterality: N/A;   DIVERTICULUM OF THE BLADDER     LEFT HEART CATH AND CORONARY ANGIOGRAPHY N/A 03/09/2023   Procedure: LEFT HEART CATH AND CORONARY ANGIOGRAPHY;  Surgeon: Verlin Lonni BIRCH, MD;  Location: [facility];  Service: Cardiovascular;  Laterality: N/A;   OOPHORECTOMY     BSO   TOTAL ABDOMINAL HYSTERECTOMY  2004   BSO    ROS: Review of Systems Negative except as stated above  PHYSICAL EXAM: BP 137/81   Pulse 86   Temp 98.8 F (37.1 C) (Oral)   Resp 16   Ht 4' 11.25 (1.505 m)   Wt 144 lb 12.8 oz (65.7 kg)   LMP 01/23/2012   SpO2 95%   BMI 29.00 kg/m   Physical Exam HENT:     Head: Normocephalic and atraumatic.     Nose: Nose normal.     Mouth/Throat:     Mouth: Mucous membranes are moist.     Pharynx: Oropharynx is clear.  Eyes:     Extraocular Movements: Extraocular movements intact.     Conjunctiva/sclera: Conjunctivae normal.     Pupils: Pupils are equal, round, and reactive to light.  Cardiovascular:      Rate and Rhythm: Normal rate and regular rhythm.     Pulses: Normal pulses.     Heart sounds: Normal heart sounds.  Pulmonary:     Effort: Pulmonary effort is normal.     Breath sounds: Normal breath sounds.  Musculoskeletal:        General: Normal range of motion.     Cervical back: Normal range of motion and neck supple.  Neurological:     General:  No focal deficit present.     Mental Status: She is alert and oriented to person, place, and time.  Psychiatric:        Mood and Affect: Mood normal.        Behavior: Behavior normal.     ASSESSMENT AND PLAN: 1. Primary hypertension (Primary) - Continue Losartan  and Amlodipine  as prescribed.  - Counseled on blood pressure goal of less than 130/80, low-sodium, DASH diet, medication compliance, and 150 minutes of moderate intensity exercise per week as tolerated. Counseled on medication adherence and adverse effects. - Follow-up with primary provider in 3 months or sooner if needed. - amLODipine  (NORVASC ) 10 MG tablet; Take 1 tablet (10 mg total) by mouth daily.  Dispense: 90 tablet; Refill: 0 - losartan  (COZAAR ) 50 MG tablet; Take 1 tablet (50 mg total) by mouth daily.  Dispense: 90 tablet; Refill: 0   Patient was given the opportunity to ask questions.  Patient verbalized understanding of the plan and was able to repeat key elements of the plan. Patient was given clear instructions to go to Emergency Department or return to medical center if symptoms don't improve, worsen, or new problems develop.The patient verbalized understanding.   Requested Prescriptions   Signed Prescriptions Disp Refills   amLODipine  (NORVASC ) 10 MG tablet 90 tablet 0    Sig: Take 1 tablet (10 mg total) by mouth daily.   losartan  (COZAAR ) 50 MG tablet 90 tablet 0    Sig: Take 1 tablet (50 mg total) by mouth daily.    Return in about 3 months (around 02/13/2025) for Follow-Up or next available chronic conditions.  Greig JINNY Chute, NP      [1]  Current  Outpatient Medications on File Prior to Visit  Medication Sig Dispense Refill   albuterol  (VENTOLIN  HFA) 108 (90 Base) MCG/ACT inhaler INHALE 2 PUFFS INTO THE LUNGS EVERY 4 HOURS AS NEEDED FOR WHEEZING OR SHORTNESS OF BREATH 6.7 g 11   ALPRAZolam (XANAX) 0.5 MG tablet Take 0.5 mg by mouth 2 (two) times daily.  1   Ascorbic Acid (VITAMIN C PO) Take 1 tablet by mouth daily.      aspirin  EC 81 MG tablet Take 1 tablet (81 mg total) by mouth daily. Swallow whole.     CALCIUM  PO Take 1 tablet by mouth daily.      cetirizine (ZYRTEC) 10 MG tablet Take 10 mg by mouth daily.     Cholecalciferol (VITAMIN D PO) Take 1 tablet by mouth daily.     Cyanocobalamin (VITAMIN B 12 PO) Take 1 tablet by mouth every other day.     cyclobenzaprine  (FLEXERIL ) 5 MG tablet Take 1 tablet (5 mg total) by mouth at bedtime. 90 tablet 0   fluticasone  (FLONASE ) 50 MCG/ACT nasal spray Place 2 sprays into both nostrils daily. 16 g 2   furosemide  (LASIX ) 20 MG tablet Take 1 tablet (20 mg total) by mouth 2 (two) times daily. 180 tablet 3   Multiple Vitamin (MULTIVITAMIN PO) Take 1 tablet by mouth daily. Reported on 04/28/2016     potassium chloride  SA (KLOR-CON  M) 20 MEQ tablet Take 1 tablet (20 mEq total) by mouth daily. 90 tablet 3   rosuvastatin  (CRESTOR ) 40 MG tablet Take 1 tablet (40 mg total) by mouth daily. 90 tablet 3   gabapentin  (NEURONTIN ) 300 MG capsule Take 1 capsule (300 mg total) by mouth 2 (two) times daily. (Patient not taking: Reported on 11/15/2024) 180 capsule 0   nitroGLYCERIN  (NITROSTAT ) 0.4 MG SL tablet  Place 1 tablet (0.4 mg total) under the tongue every 5 (five) minutes as needed for chest pain. (Patient not taking: Reported on 11/15/2024) 25 tablet 3   No current facility-administered medications on file prior to visit.  [2] No Known Allergies  "

## 2024-11-15 NOTE — Progress Notes (Signed)
 Follow up.

## 2024-11-21 ENCOUNTER — Ambulatory Visit: Admitting: Family

## 2025-02-13 ENCOUNTER — Ambulatory Visit: Payer: Self-pay | Admitting: Family

## 2025-06-07 ENCOUNTER — Ambulatory Visit

## 2025-11-15 ENCOUNTER — Encounter: Admitting: Nurse Practitioner
